# Patient Record
Sex: Female | Born: 1935 | Race: Black or African American | Hispanic: No | Smoking: Never smoker
Health system: Southern US, Community
[De-identification: ages and names within clinical notes are randomized; demographics above are authoritative.]

## PROBLEM LIST (undated history)

## (undated) DIAGNOSIS — R0600 Dyspnea, unspecified: Secondary | ICD-10-CM

## (undated) DIAGNOSIS — M1990 Unspecified osteoarthritis, unspecified site: Secondary | ICD-10-CM

## (undated) DIAGNOSIS — Z9221 Personal history of antineoplastic chemotherapy: Secondary | ICD-10-CM

## (undated) DIAGNOSIS — C801 Malignant (primary) neoplasm, unspecified: Secondary | ICD-10-CM

## (undated) DIAGNOSIS — N189 Chronic kidney disease, unspecified: Secondary | ICD-10-CM

## (undated) DIAGNOSIS — R519 Headache, unspecified: Secondary | ICD-10-CM

## (undated) DIAGNOSIS — Z923 Personal history of irradiation: Secondary | ICD-10-CM

## (undated) DIAGNOSIS — I10 Essential (primary) hypertension: Secondary | ICD-10-CM

## (undated) DIAGNOSIS — R079 Chest pain, unspecified: Secondary | ICD-10-CM

## (undated) DIAGNOSIS — R51 Headache: Secondary | ICD-10-CM

## (undated) DIAGNOSIS — K219 Gastro-esophageal reflux disease without esophagitis: Secondary | ICD-10-CM

## (undated) HISTORY — DX: Dyspnea, unspecified: R06.00

## (undated) HISTORY — PX: APPENDECTOMY: SHX54

## (undated) HISTORY — PX: TONSILLECTOMY: SUR1361

## (undated) HISTORY — PX: ABDOMINAL HYSTERECTOMY: SHX81

## (undated) HISTORY — DX: Chest pain, unspecified: R07.9

---

## 2004-02-15 ENCOUNTER — Encounter: Admission: RE | Admit: 2004-02-15 | Discharge: 2004-02-15 | Payer: Self-pay | Admitting: Family Medicine

## 2004-02-23 ENCOUNTER — Ambulatory Visit (HOSPITAL_COMMUNITY): Admission: RE | Admit: 2004-02-23 | Discharge: 2004-02-24 | Payer: Self-pay | Admitting: Family Medicine

## 2004-05-14 ENCOUNTER — Emergency Department (HOSPITAL_COMMUNITY): Admission: EM | Admit: 2004-05-14 | Discharge: 2004-05-14 | Payer: Self-pay | Admitting: Emergency Medicine

## 2004-06-01 ENCOUNTER — Encounter: Admission: RE | Admit: 2004-06-01 | Discharge: 2004-07-06 | Payer: Self-pay | Admitting: Family Medicine

## 2004-07-03 ENCOUNTER — Encounter: Admission: RE | Admit: 2004-07-03 | Discharge: 2004-07-03 | Payer: Self-pay | Admitting: Family Medicine

## 2004-09-04 ENCOUNTER — Encounter: Admission: RE | Admit: 2004-09-04 | Discharge: 2004-09-04 | Payer: Self-pay | Admitting: Family Medicine

## 2004-09-12 ENCOUNTER — Ambulatory Visit (HOSPITAL_COMMUNITY): Admission: RE | Admit: 2004-09-12 | Discharge: 2004-09-12 | Payer: Self-pay | Admitting: Family Medicine

## 2005-08-20 DIAGNOSIS — C801 Malignant (primary) neoplasm, unspecified: Secondary | ICD-10-CM

## 2005-08-20 HISTORY — DX: Malignant (primary) neoplasm, unspecified: C80.1

## 2005-08-20 HISTORY — PX: BREAST LUMPECTOMY: SHX2

## 2005-10-09 ENCOUNTER — Encounter: Admission: RE | Admit: 2005-10-09 | Discharge: 2005-10-09 | Payer: Self-pay | Admitting: Family Medicine

## 2005-10-26 ENCOUNTER — Encounter (INDEPENDENT_AMBULATORY_CARE_PROVIDER_SITE_OTHER): Payer: Self-pay | Admitting: Diagnostic Radiology

## 2005-10-26 ENCOUNTER — Encounter (INDEPENDENT_AMBULATORY_CARE_PROVIDER_SITE_OTHER): Payer: Self-pay | Admitting: Specialist

## 2005-10-26 ENCOUNTER — Encounter: Admission: RE | Admit: 2005-10-26 | Discharge: 2005-10-26 | Payer: Self-pay | Admitting: Family Medicine

## 2005-11-06 ENCOUNTER — Encounter: Admission: RE | Admit: 2005-11-06 | Discharge: 2005-11-06 | Payer: Self-pay | Admitting: Family Medicine

## 2005-11-08 ENCOUNTER — Encounter: Admission: RE | Admit: 2005-11-08 | Discharge: 2005-11-08 | Payer: Self-pay | Admitting: General Surgery

## 2005-11-09 ENCOUNTER — Encounter: Admission: RE | Admit: 2005-11-09 | Discharge: 2005-11-09 | Payer: Self-pay | Admitting: General Surgery

## 2005-11-09 ENCOUNTER — Ambulatory Visit (HOSPITAL_BASED_OUTPATIENT_CLINIC_OR_DEPARTMENT_OTHER): Admission: RE | Admit: 2005-11-09 | Discharge: 2005-11-09 | Payer: Self-pay | Admitting: General Surgery

## 2005-11-14 ENCOUNTER — Encounter (INDEPENDENT_AMBULATORY_CARE_PROVIDER_SITE_OTHER): Payer: Self-pay | Admitting: Specialist

## 2005-11-14 ENCOUNTER — Encounter: Admission: RE | Admit: 2005-11-14 | Discharge: 2005-11-14 | Payer: Self-pay | Admitting: General Surgery

## 2005-11-14 ENCOUNTER — Ambulatory Visit (HOSPITAL_COMMUNITY): Admission: RE | Admit: 2005-11-14 | Discharge: 2005-11-14 | Payer: Self-pay | Admitting: General Surgery

## 2005-11-20 ENCOUNTER — Ambulatory Visit: Payer: Self-pay | Admitting: Oncology

## 2005-11-28 LAB — CBC WITH DIFFERENTIAL/PLATELET
BASO%: 0.3 % (ref 0.0–2.0)
EOS%: 0.8 % (ref 0.0–7.0)
HCT: 38.8 % (ref 34.8–46.6)
LYMPH%: 26.5 % (ref 14.0–48.0)
MCH: 31.4 pg (ref 26.0–34.0)
MCHC: 34 g/dL (ref 32.0–36.0)
MONO#: 0.3 10*3/uL (ref 0.1–0.9)
NEUT%: 64.9 % (ref 39.6–76.8)
Platelets: 215 10*3/uL (ref 145–400)

## 2005-11-28 LAB — COMPREHENSIVE METABOLIC PANEL
ALT: 10 U/L (ref 0–40)
BUN: 13 mg/dL (ref 6–23)
CO2: 30 mEq/L (ref 19–32)
Creatinine, Ser: 0.8 mg/dL (ref 0.4–1.2)
Total Bilirubin: 0.5 mg/dL (ref 0.3–1.2)

## 2005-11-28 LAB — LACTATE DEHYDROGENASE: LDH: 167 U/L (ref 94–250)

## 2005-12-08 ENCOUNTER — Encounter: Admission: RE | Admit: 2005-12-08 | Discharge: 2005-12-08 | Payer: Self-pay | Admitting: Family Medicine

## 2005-12-25 ENCOUNTER — Encounter: Admission: RE | Admit: 2005-12-25 | Discharge: 2005-12-25 | Payer: Self-pay | Admitting: Oncology

## 2006-01-08 ENCOUNTER — Encounter: Admission: RE | Admit: 2006-01-08 | Discharge: 2006-01-08 | Payer: Self-pay | Admitting: Nephrology

## 2006-02-27 ENCOUNTER — Ambulatory Visit: Payer: Self-pay | Admitting: Oncology

## 2006-07-04 ENCOUNTER — Ambulatory Visit: Payer: Self-pay | Admitting: Oncology

## 2006-08-05 LAB — CBC WITH DIFFERENTIAL/PLATELET
BASO%: 0.3 % (ref 0.0–2.0)
LYMPH%: 35.3 % (ref 14.0–48.0)
MCHC: 33.8 g/dL (ref 32.0–36.0)
MCV: 92.9 fL (ref 81.0–101.0)
MONO%: 8 % (ref 0.0–13.0)
Platelets: 219 10*3/uL (ref 145–400)
RBC: 4.19 10*6/uL (ref 3.70–5.32)
WBC: 4.1 10*3/uL (ref 3.9–10.0)

## 2006-08-05 LAB — COMPREHENSIVE METABOLIC PANEL
ALT: 12 U/L (ref 0–35)
Alkaline Phosphatase: 70 U/L (ref 39–117)
Sodium: 141 mEq/L (ref 135–145)
Total Bilirubin: 0.4 mg/dL (ref 0.3–1.2)
Total Protein: 7.8 g/dL (ref 6.0–8.3)

## 2006-10-11 ENCOUNTER — Encounter: Admission: RE | Admit: 2006-10-11 | Discharge: 2006-10-11 | Payer: Self-pay | Admitting: General Surgery

## 2006-10-30 ENCOUNTER — Ambulatory Visit: Payer: Self-pay | Admitting: Oncology

## 2006-12-03 LAB — CBC WITH DIFFERENTIAL/PLATELET
Basophils Absolute: 0 10*3/uL (ref 0.0–0.1)
Eosinophils Absolute: 0 10*3/uL (ref 0.0–0.5)
HGB: 13.1 g/dL (ref 11.6–15.9)
MCV: 91.2 fL (ref 81.0–101.0)
MONO#: 0.4 10*3/uL (ref 0.1–0.9)
NEUT#: 2 10*3/uL (ref 1.5–6.5)
RDW: 13.6 % (ref 11.3–14.5)
WBC: 3.6 10*3/uL — ABNORMAL LOW (ref 3.9–10.0)
lymph#: 1.2 10*3/uL (ref 0.9–3.3)

## 2006-12-03 LAB — COMPREHENSIVE METABOLIC PANEL
Albumin: 4.1 g/dL (ref 3.5–5.2)
BUN: 17 mg/dL (ref 6–23)
CO2: 29 mEq/L (ref 19–32)
Calcium: 9.4 mg/dL (ref 8.4–10.5)
Chloride: 104 mEq/L (ref 96–112)
Glucose, Bld: 74 mg/dL (ref 70–99)
Potassium: 3.8 mEq/L (ref 3.5–5.3)
Sodium: 143 mEq/L (ref 135–145)
Total Protein: 7.4 g/dL (ref 6.0–8.3)

## 2006-12-19 ENCOUNTER — Ambulatory Visit: Payer: Self-pay | Admitting: Oncology

## 2007-02-28 ENCOUNTER — Ambulatory Visit: Payer: Self-pay | Admitting: Oncology

## 2007-03-03 ENCOUNTER — Inpatient Hospital Stay (HOSPITAL_COMMUNITY): Admission: EM | Admit: 2007-03-03 | Discharge: 2007-03-09 | Payer: Self-pay | Admitting: Emergency Medicine

## 2007-03-03 ENCOUNTER — Ambulatory Visit: Payer: Self-pay | Admitting: Family Medicine

## 2007-04-09 LAB — COMPREHENSIVE METABOLIC PANEL
AST: 15 U/L (ref 0–37)
Albumin: 4 g/dL (ref 3.5–5.2)
Alkaline Phosphatase: 79 U/L (ref 39–117)
BUN: 14 mg/dL (ref 6–23)
Glucose, Bld: 109 mg/dL — ABNORMAL HIGH (ref 70–99)
Potassium: 4.2 mEq/L (ref 3.5–5.3)
Sodium: 140 mEq/L (ref 135–145)
Total Bilirubin: 0.4 mg/dL (ref 0.3–1.2)

## 2007-04-09 LAB — CBC WITH DIFFERENTIAL/PLATELET
BASO%: 2 % (ref 0.0–2.0)
Eosinophils Absolute: 0.1 10*3/uL (ref 0.0–0.5)
LYMPH%: 37.1 % (ref 14.0–48.0)
MCHC: 34.5 g/dL (ref 32.0–36.0)
MONO#: 0.4 10*3/uL (ref 0.1–0.9)
MONO%: 10.6 % (ref 0.0–13.0)
NEUT#: 1.6 10*3/uL (ref 1.5–6.5)
RBC: 3.93 10*6/uL (ref 3.70–5.32)
RDW: 14.3 % (ref 11.3–14.5)
WBC: 3.4 10*3/uL — ABNORMAL LOW (ref 3.9–10.0)

## 2007-04-09 LAB — PROTIME-INR
INR: 2.7 (ref 2.00–3.50)
Protime: 32.4 Seconds — ABNORMAL HIGH (ref 10.6–13.4)

## 2007-04-29 ENCOUNTER — Ambulatory Visit: Admission: RE | Admit: 2007-04-29 | Discharge: 2007-05-13 | Payer: Self-pay | Admitting: Radiation Oncology

## 2007-05-13 ENCOUNTER — Ambulatory Visit: Payer: Self-pay | Admitting: Oncology

## 2007-05-13 LAB — CBC WITH DIFFERENTIAL/PLATELET
BASO%: 0.7 % (ref 0.0–2.0)
Basophils Absolute: 0 10*3/uL (ref 0.0–0.1)
EOS%: 1.1 % (ref 0.0–7.0)
HGB: 13.2 g/dL (ref 11.6–15.9)
MCH: 31.3 pg (ref 26.0–34.0)
MCHC: 34.8 g/dL (ref 32.0–36.0)
MCV: 90.1 fL (ref 81.0–101.0)
MONO%: 8.8 % (ref 0.0–13.0)
RBC: 4.2 10*6/uL (ref 3.70–5.32)
RDW: 14.4 % (ref 11.3–14.5)
lymph#: 2 10*3/uL (ref 0.9–3.3)

## 2007-08-22 ENCOUNTER — Ambulatory Visit: Payer: Self-pay | Admitting: Oncology

## 2007-09-02 LAB — COMPREHENSIVE METABOLIC PANEL
ALT: 18 U/L (ref 0–35)
Albumin: 3.8 g/dL (ref 3.5–5.2)
Alkaline Phosphatase: 81 U/L (ref 39–117)
Potassium: 3.6 mEq/L (ref 3.5–5.3)
Sodium: 140 mEq/L (ref 135–145)
Total Bilirubin: 0.8 mg/dL (ref 0.3–1.2)
Total Protein: 7.9 g/dL (ref 6.0–8.3)

## 2007-09-02 LAB — CBC WITH DIFFERENTIAL/PLATELET
EOS%: 1.2 % (ref 0.0–7.0)
Eosinophils Absolute: 0 10*3/uL (ref 0.0–0.5)
MCH: 31 pg (ref 26.0–34.0)
MCV: 88.6 fL (ref 81.0–101.0)
MONO%: 10.4 % (ref 0.0–13.0)
NEUT#: 1.7 10*3/uL (ref 1.5–6.5)
RBC: 4.26 10*6/uL (ref 3.70–5.32)
RDW: 11.2 % — ABNORMAL LOW (ref 11.3–14.5)
lymph#: 1.4 10*3/uL (ref 0.9–3.3)

## 2007-09-02 LAB — CANCER ANTIGEN 27.29: CA 27.29: 35 U/mL (ref 0–39)

## 2007-10-14 ENCOUNTER — Encounter: Admission: RE | Admit: 2007-10-14 | Discharge: 2007-10-14 | Payer: Self-pay | Admitting: Oncology

## 2007-10-21 ENCOUNTER — Ambulatory Visit: Payer: Self-pay | Admitting: Oncology

## 2007-10-25 ENCOUNTER — Encounter: Admission: RE | Admit: 2007-10-25 | Discharge: 2007-10-25 | Payer: Self-pay | Admitting: Oncology

## 2007-10-29 LAB — COMPREHENSIVE METABOLIC PANEL
AST: 17 U/L (ref 0–37)
Albumin: 4.1 g/dL (ref 3.5–5.2)
Alkaline Phosphatase: 70 U/L (ref 39–117)
Glucose, Bld: 92 mg/dL (ref 70–99)
Potassium: 4.5 mEq/L (ref 3.5–5.3)
Sodium: 144 mEq/L (ref 135–145)
Total Protein: 7.1 g/dL (ref 6.0–8.3)

## 2007-10-29 LAB — CBC WITH DIFFERENTIAL/PLATELET
Eosinophils Absolute: 0 10*3/uL (ref 0.0–0.5)
MCV: 90.3 fL (ref 81.0–101.0)
MONO#: 0.4 10*3/uL (ref 0.1–0.9)
MONO%: 10.3 % (ref 0.0–13.0)
NEUT#: 2 10*3/uL (ref 1.5–6.5)
RBC: 3.94 10*6/uL (ref 3.70–5.32)
RDW: 13.9 % (ref 11.3–14.5)
WBC: 3.6 10*3/uL — ABNORMAL LOW (ref 3.9–10.0)
lymph#: 1.1 10*3/uL (ref 0.9–3.3)

## 2007-11-18 ENCOUNTER — Ambulatory Visit: Admission: RE | Admit: 2007-11-18 | Discharge: 2008-01-26 | Payer: Self-pay | Admitting: Radiation Oncology

## 2008-01-19 ENCOUNTER — Ambulatory Visit: Payer: Self-pay | Admitting: Oncology

## 2008-10-14 ENCOUNTER — Encounter: Admission: RE | Admit: 2008-10-14 | Discharge: 2008-10-14 | Payer: Self-pay | Admitting: Oncology

## 2008-11-05 ENCOUNTER — Ambulatory Visit: Payer: Self-pay | Admitting: Oncology

## 2008-12-06 LAB — COMPREHENSIVE METABOLIC PANEL
Albumin: 4.1 g/dL (ref 3.5–5.2)
CO2: 27 mEq/L (ref 19–32)
Calcium: 9.2 mg/dL (ref 8.4–10.5)
Chloride: 104 mEq/L (ref 96–112)
Glucose, Bld: 79 mg/dL (ref 70–99)
Potassium: 3.9 mEq/L (ref 3.5–5.3)
Sodium: 142 mEq/L (ref 135–145)
Total Protein: 7.4 g/dL (ref 6.0–8.3)

## 2008-12-06 LAB — CBC WITH DIFFERENTIAL/PLATELET
Basophils Absolute: 0 10*3/uL (ref 0.0–0.1)
Eosinophils Absolute: 0.1 10*3/uL (ref 0.0–0.5)
HGB: 12.9 g/dL (ref 11.6–15.9)
MONO#: 0.5 10*3/uL (ref 0.1–0.9)
NEUT#: 1.7 10*3/uL (ref 1.5–6.5)
RBC: 4.01 10*6/uL (ref 3.70–5.45)
RDW: 13.7 % (ref 11.2–14.5)
WBC: 3.1 10*3/uL — ABNORMAL LOW (ref 3.9–10.3)

## 2008-12-16 ENCOUNTER — Ambulatory Visit (HOSPITAL_COMMUNITY): Admission: RE | Admit: 2008-12-16 | Discharge: 2008-12-16 | Payer: Self-pay | Admitting: Oncology

## 2009-01-02 ENCOUNTER — Emergency Department (HOSPITAL_BASED_OUTPATIENT_CLINIC_OR_DEPARTMENT_OTHER): Admission: EM | Admit: 2009-01-02 | Discharge: 2009-01-02 | Payer: Self-pay | Admitting: Emergency Medicine

## 2009-08-20 HISTORY — PX: BREAST SURGERY: SHX581

## 2009-09-09 ENCOUNTER — Ambulatory Visit: Payer: Self-pay | Admitting: Vascular Surgery

## 2009-10-05 ENCOUNTER — Emergency Department (HOSPITAL_COMMUNITY): Admission: EM | Admit: 2009-10-05 | Discharge: 2009-10-06 | Payer: Self-pay | Admitting: Emergency Medicine

## 2009-10-17 ENCOUNTER — Encounter: Admission: RE | Admit: 2009-10-17 | Discharge: 2009-10-17 | Payer: Self-pay | Admitting: Unknown Physician Specialty

## 2010-09-09 ENCOUNTER — Encounter: Payer: Self-pay | Admitting: Family Medicine

## 2010-09-10 ENCOUNTER — Encounter: Payer: Self-pay | Admitting: Oncology

## 2010-09-10 ENCOUNTER — Encounter: Payer: Self-pay | Admitting: Family Medicine

## 2010-09-21 ENCOUNTER — Other Ambulatory Visit: Payer: Self-pay | Admitting: Internal Medicine

## 2010-09-21 DIAGNOSIS — Z9889 Other specified postprocedural states: Secondary | ICD-10-CM

## 2010-10-18 ENCOUNTER — Ambulatory Visit
Admission: RE | Admit: 2010-10-18 | Discharge: 2010-10-18 | Disposition: A | Discharge status: Home / Self Care | Payer: Medicare Other | Source: Ambulatory Visit | Attending: Internal Medicine | Admitting: Internal Medicine

## 2010-10-18 DIAGNOSIS — Z9889 Other specified postprocedural states: Secondary | ICD-10-CM

## 2010-10-30 ENCOUNTER — Emergency Department (HOSPITAL_BASED_OUTPATIENT_CLINIC_OR_DEPARTMENT_OTHER)
Admission: EM | Admit: 2010-10-30 | Discharge: 2010-10-30 | Disposition: A | Discharge status: Nursing Home (Includes ICF) | Payer: Medicare Other | Attending: Emergency Medicine | Admitting: Emergency Medicine

## 2010-10-30 ENCOUNTER — Emergency Department (HOSPITAL_BASED_OUTPATIENT_CLINIC_OR_DEPARTMENT_OTHER): Payer: Medicare Other

## 2010-10-30 ENCOUNTER — Inpatient Hospital Stay (HOSPITAL_COMMUNITY)
Admission: AD | Admit: 2010-10-30 | Discharge: 2010-11-03 | DRG: 313 | Disposition: A | Discharge status: Home / Self Care | Payer: Medicare Other | Source: Other Acute Inpatient Hospital | Attending: Internal Medicine | Admitting: Internal Medicine

## 2010-10-30 ENCOUNTER — Emergency Department (INDEPENDENT_AMBULATORY_CARE_PROVIDER_SITE_OTHER): Payer: Medicare Other

## 2010-10-30 DIAGNOSIS — M1990 Unspecified osteoarthritis, unspecified site: Secondary | ICD-10-CM | POA: Diagnosis present

## 2010-10-30 DIAGNOSIS — I10 Essential (primary) hypertension: Secondary | ICD-10-CM | POA: Diagnosis present

## 2010-10-30 DIAGNOSIS — E7800 Pure hypercholesterolemia, unspecified: Secondary | ICD-10-CM | POA: Insufficient documentation

## 2010-10-30 DIAGNOSIS — I252 Old myocardial infarction: Secondary | ICD-10-CM

## 2010-10-30 DIAGNOSIS — N390 Urinary tract infection, site not specified: Secondary | ICD-10-CM | POA: Diagnosis present

## 2010-10-30 DIAGNOSIS — R079 Chest pain, unspecified: Secondary | ICD-10-CM | POA: Insufficient documentation

## 2010-10-30 DIAGNOSIS — Z7982 Long term (current) use of aspirin: Secondary | ICD-10-CM

## 2010-10-30 DIAGNOSIS — Z853 Personal history of malignant neoplasm of breast: Secondary | ICD-10-CM | POA: Insufficient documentation

## 2010-10-30 DIAGNOSIS — K5900 Constipation, unspecified: Secondary | ICD-10-CM | POA: Diagnosis present

## 2010-10-30 DIAGNOSIS — R1110 Vomiting, unspecified: Secondary | ICD-10-CM

## 2010-10-30 DIAGNOSIS — E876 Hypokalemia: Secondary | ICD-10-CM | POA: Diagnosis present

## 2010-10-30 DIAGNOSIS — Z86718 Personal history of other venous thrombosis and embolism: Secondary | ICD-10-CM

## 2010-10-30 DIAGNOSIS — R109 Unspecified abdominal pain: Secondary | ICD-10-CM

## 2010-10-30 DIAGNOSIS — E785 Hyperlipidemia, unspecified: Secondary | ICD-10-CM | POA: Diagnosis present

## 2010-10-30 DIAGNOSIS — R112 Nausea with vomiting, unspecified: Secondary | ICD-10-CM | POA: Diagnosis present

## 2010-10-30 DIAGNOSIS — I9589 Other hypotension: Secondary | ICD-10-CM | POA: Diagnosis present

## 2010-10-30 DIAGNOSIS — K5289 Other specified noninfective gastroenteritis and colitis: Secondary | ICD-10-CM | POA: Diagnosis present

## 2010-10-30 DIAGNOSIS — T465X5A Adverse effect of other antihypertensive drugs, initial encounter: Secondary | ICD-10-CM | POA: Diagnosis present

## 2010-10-30 DIAGNOSIS — M19019 Primary osteoarthritis, unspecified shoulder: Secondary | ICD-10-CM | POA: Diagnosis present

## 2010-10-30 LAB — COMPREHENSIVE METABOLIC PANEL
BUN: 17 mg/dL (ref 6–23)
CO2: 23 mEq/L (ref 19–32)
Calcium: 10 mg/dL (ref 8.4–10.5)
Creatinine, Ser: 0.6 mg/dL (ref 0.4–1.2)
GFR calc Af Amer: >60 mL/min (ref >60)
GFR calc non Af Amer: >60 mL/min (ref >60)
Glucose, Bld: 186 mg/dL — ABNORMAL HIGH (ref 70–99)
Total Bilirubin: 1 mg/dL (ref 0.3–1.2)

## 2010-10-30 LAB — CBC
HCT: 39.8 % (ref 36.0–46.0)
MCH: 30.7 pg (ref 26.0–34.0)
MCHC: 35.2 g/dL (ref 30.0–36.0)
RDW: 11.9 % (ref 11.5–15.5)

## 2010-10-30 LAB — POCT CARDIAC MARKERS
CKMB, poc: 1.6 ng/mL (ref 1.0–8.0)
Myoglobin, poc: 62.5 ng/mL (ref 12–200)

## 2010-10-30 LAB — URINALYSIS, ROUTINE W REFLEX MICROSCOPIC
Hgb urine dipstick: NEGATIVE
Ketones, ur: >80 mg/dL — AB
Leukocytes, UA: NEGATIVE
Protein, ur: 30 mg/dL — AB
Urobilinogen, UA: 1 mg/dL (ref 0.0–1.0)

## 2010-10-30 LAB — URINE MICROSCOPIC-ADD ON

## 2010-10-30 LAB — PROTIME-INR
INR: 0.92 (ref 0.00–1.49)
Prothrombin Time: 12.6 seconds (ref 11.6–15.2)

## 2010-10-30 LAB — DIFFERENTIAL
Basophils Absolute: 0 10*3/uL (ref 0.0–0.1)
Eosinophils Relative: 0 % (ref 0–5)
Lymphocytes Relative: 10 % — ABNORMAL LOW (ref 12–46)
Monocytes Absolute: 0.2 10*3/uL (ref 0.1–1.0)
Monocytes Relative: 3 % (ref 3–12)

## 2010-10-30 LAB — LIPASE, BLOOD: Lipase: 28 U/L (ref 23–300)

## 2010-10-30 LAB — APTT: aPTT: 24 seconds (ref 24–37)

## 2010-10-31 ENCOUNTER — Inpatient Hospital Stay (HOSPITAL_COMMUNITY): Payer: Medicare Other

## 2010-10-31 LAB — CK TOTAL AND CKMB (NOT AT ARMC)
CK, MB: 3.5 ng/mL (ref 0.3–4.0)
CK, MB: 4.5 ng/mL — ABNORMAL HIGH (ref 0.3–4.0)
Relative Index: INVALID (ref 0.0–2.5)
Relative Index: 1.8 (ref 0.0–2.5)
Relative Index: 1.7 (ref 0.0–2.5)
Total CK: 75 U/L (ref 7–177)
Total CK: 198 U/L — ABNORMAL HIGH (ref 7–177)
Total CK: 259 U/L — ABNORMAL HIGH (ref 7–177)

## 2010-10-31 LAB — HEMOGLOBIN A1C
Hgb A1c MFr Bld: 6.5 % — ABNORMAL HIGH (ref <5.7)
Mean Plasma Glucose: 140 mg/dL — ABNORMAL HIGH (ref <117)

## 2010-10-31 LAB — CBC
HCT: 41.3 % (ref 36.0–46.0)
Hemoglobin: 14.4 g/dL (ref 12.0–15.0)
MCH: 31.1 pg (ref 26.0–34.0)
MCHC: 34.9 g/dL (ref 30.0–36.0)
MCV: 89.2 fL (ref 78.0–100.0)
Platelets: 212 10*3/uL (ref 150–400)
RBC: 4.63 MIL/uL (ref 3.87–5.11)
RDW: 12.5 % (ref 11.5–15.5)
WBC: 6.2 10*3/uL (ref 4.0–10.5)

## 2010-10-31 LAB — COMPREHENSIVE METABOLIC PANEL
BUN: 9 mg/dL (ref 6–23)
Calcium: 9.5 mg/dL (ref 8.4–10.5)
GFR calc non Af Amer: >60 mL/min (ref >60)
Glucose, Bld: 137 mg/dL — ABNORMAL HIGH (ref 70–99)
Total Protein: 7.6 g/dL (ref 6.0–8.3)

## 2010-10-31 LAB — GLUCOSE, CAPILLARY
Glucose-Capillary: 161 mg/dL — ABNORMAL HIGH (ref 70–99)
Glucose-Capillary: 154 mg/dL — ABNORMAL HIGH (ref 70–99)

## 2010-10-31 LAB — MAGNESIUM: Magnesium: 2.1 mg/dL (ref 1.5–2.5)

## 2010-10-31 LAB — HEPARIN LEVEL (UNFRACTIONATED): Heparin Unfractionated: 0.35 IU/mL (ref 0.30–0.70)

## 2010-10-31 LAB — PHOSPHORUS: Phosphorus: 2.3 mg/dL (ref 2.3–4.6)

## 2010-10-31 MED ORDER — IOHEXOL 300 MG/ML  SOLN
100.0000 mL | Freq: Once | INTRAMUSCULAR | Status: AC | PRN
  Administered 2010-10-31: 100 mL via INTRAVENOUS

## 2010-11-01 ENCOUNTER — Inpatient Hospital Stay (HOSPITAL_COMMUNITY): Payer: Medicare Other

## 2010-11-01 LAB — CBC
HCT: 40.3 % (ref 36.0–46.0)
MCHC: 34 g/dL (ref 30.0–36.0)
MCV: 89.2 fL (ref 78.0–100.0)
RDW: 12.6 % (ref 11.5–15.5)

## 2010-11-01 LAB — DIFFERENTIAL
Basophils Absolute: 0 10*3/uL (ref 0.0–0.1)
Eosinophils Absolute: 0 10*3/uL (ref 0.0–0.7)
Eosinophils Relative: 0 % (ref 0–5)
Lymphocytes Relative: 27 % (ref 12–46)
Monocytes Absolute: 0.7 10*3/uL (ref 0.1–1.0)

## 2010-11-01 LAB — CARDIAC PANEL(CRET KIN+CKTOT+MB+TROPI)
CK, MB: 3.5 ng/mL (ref 0.3–4.0)
Relative Index: 1.5 (ref 0.0–2.5)
Total CK: 194 U/L — ABNORMAL HIGH (ref 7–177)
Total CK: 230 U/L — ABNORMAL HIGH (ref 7–177)
Troponin I: 0.04 ng/mL (ref 0.00–0.06)
Troponin I: 0.02 ng/mL (ref 0.00–0.06)

## 2010-11-01 LAB — GLUCOSE, CAPILLARY
Glucose-Capillary: 114 mg/dL — ABNORMAL HIGH (ref 70–99)
Glucose-Capillary: 115 mg/dL — ABNORMAL HIGH (ref 70–99)
Glucose-Capillary: 140 mg/dL — ABNORMAL HIGH (ref 70–99)

## 2010-11-01 LAB — URINE CULTURE
Colony Count: 55000
Culture  Setup Time: 201203121948

## 2010-11-01 LAB — COMPREHENSIVE METABOLIC PANEL
BUN: 14 mg/dL (ref 6–23)
CO2: 29 mEq/L (ref 19–32)
Chloride: 99 mEq/L (ref 96–112)
Creatinine, Ser: 0.98 mg/dL (ref 0.4–1.2)
GFR calc non Af Amer: 54 mL/min — ABNORMAL LOW (ref >60)
Total Bilirubin: 1.3 mg/dL — ABNORMAL HIGH (ref 0.3–1.2)

## 2010-11-01 LAB — CORTISOL: Cortisol, Plasma: 10.7 ug/dL

## 2010-11-02 LAB — URINALYSIS, ROUTINE W REFLEX MICROSCOPIC
Bilirubin Urine: NEGATIVE
Glucose, UA: NEGATIVE mg/dL
Hgb urine dipstick: NEGATIVE
Ketones, ur: NEGATIVE mg/dL
Protein, ur: NEGATIVE mg/dL

## 2010-11-02 LAB — DIFFERENTIAL
Eosinophils Absolute: 0 10*3/uL (ref 0.0–0.7)
Eosinophils Relative: 1 % (ref 0–5)
Lymphs Abs: 1.3 10*3/uL (ref 0.7–4.0)
Monocytes Absolute: 0.7 10*3/uL (ref 0.1–1.0)
Monocytes Relative: 18 % — ABNORMAL HIGH (ref 3–12)
Neutrophils Relative %: 49 % (ref 43–77)

## 2010-11-02 LAB — GLUCOSE, CAPILLARY
Glucose-Capillary: 92 mg/dL (ref 70–99)
Glucose-Capillary: 97 mg/dL (ref 70–99)

## 2010-11-02 LAB — BASIC METABOLIC PANEL
CO2: 28 mEq/L (ref 19–32)
Calcium: 9.1 mg/dL (ref 8.4–10.5)
Chloride: 112 mEq/L (ref 96–112)
Creatinine, Ser: 0.82 mg/dL (ref 0.4–1.2)
GFR calc Af Amer: >60 mL/min (ref >60)
Sodium: 147 mEq/L — ABNORMAL HIGH (ref 135–145)

## 2010-11-02 LAB — CARDIAC PANEL(CRET KIN+CKTOT+MB+TROPI)
CK, MB: 3.7 ng/mL (ref 0.3–4.0)
Total CK: 195 U/L — ABNORMAL HIGH (ref 7–177)
Troponin I: 0.02 ng/mL (ref 0.00–0.06)

## 2010-11-02 LAB — HEPARIN LEVEL (UNFRACTIONATED): Heparin Unfractionated: <0.1 IU/mL — ABNORMAL LOW (ref 0.30–0.70)

## 2010-11-02 LAB — CBC
MCH: 30 pg (ref 26.0–34.0)
MCV: 90 fL (ref 78.0–100.0)
RBC: 3.8 MIL/uL — ABNORMAL LOW (ref 3.87–5.11)

## 2010-11-03 LAB — CBC
HCT: 34.7 % — ABNORMAL LOW (ref 36.0–46.0)
Hemoglobin: 11.5 g/dL — ABNORMAL LOW (ref 12.0–15.0)
MCH: 29.9 pg (ref 26.0–34.0)
MCHC: 33.1 g/dL (ref 30.0–36.0)
MCV: 90.4 fL (ref 78.0–100.0)
Platelets: 153 10*3/uL (ref 150–400)
RBC: 3.84 MIL/uL — ABNORMAL LOW (ref 3.87–5.11)
RDW: 12.7 % (ref 11.5–15.5)
WBC: 2.8 10*3/uL — ABNORMAL LOW (ref 4.0–10.5)

## 2010-11-03 LAB — BASIC METABOLIC PANEL
BUN: 5 mg/dL — ABNORMAL LOW (ref 6–23)
Chloride: 106 mEq/L (ref 96–112)
GFR calc Af Amer: >60 mL/min (ref >60)
GFR calc non Af Amer: >60 mL/min (ref >60)
Potassium: 3.4 mEq/L — ABNORMAL LOW (ref 3.5–5.1)
Sodium: 139 mEq/L (ref 135–145)

## 2010-11-03 LAB — GLUCOSE, CAPILLARY
Glucose-Capillary: 111 mg/dL — ABNORMAL HIGH (ref 70–99)
Glucose-Capillary: 106 mg/dL — ABNORMAL HIGH (ref 70–99)
Glucose-Capillary: 107 mg/dL — ABNORMAL HIGH (ref 70–99)
Glucose-Capillary: 85 mg/dL (ref 70–99)

## 2010-11-03 LAB — URINE CULTURE
Colony Count: NO GROWTH
Culture  Setup Time: 201203150400

## 2010-11-06 NOTE — Consult Note (Signed)
NAME:  Felicia Carlson, Felicia Carlson                   ACCOUNT NO.:  [id number]  MEDICAL RECORD NO.:  [id number]           PATIENT TYPE:  LOCATION:                                 FACILITY:  PHYSICIAN:  Eulas Post, MD    DATE OF BIRTH:  1925/02/18  DATE OF CONSULTATION:  10/31/2010 DATE OF DISCHARGE:                                CONSULTATION   REQUESTING PHYSICIAN:  [facility].  CHIEF COMPLAINT:  Left greater than right shoulder pain.  HISTORY:  [name] is an 77 year old woman who presented to the hospital with complaints of malaise and gastric upset as well as some nausea and left-sided more than right-sided chest and shoulder pain. She is admitted for cardiac workup.  She continued to have persistent left shoulder pain.  On my history, her shoulder pain had been going on for at least 2 weeks, possibly longer.  She had difficulty lifting and difficulty using the left shoulder.  She localizes pain directly around the glenohumeral joint.She rates her pain as 8/10 when she tries to do activities.  Resting makes it better.  Her past history is significant for multiple medical problems including history of cancer as well as pulmonary embolus as well as cardiac disease and has had multiple stents and she has had blood clots previously with chronic lower extremity swelling.  Please see the written documentation for additional details of her past medical, family, social history, review of systems, and exam findings today.  She does have a past family history of cancer with a sister with ovarian cancer and mother with pancreatic cancer.  PHYSICAL EXAMINATION:  Additional exam findings demonstrate her right shoulder to have active range of motion 0 to 170 degrees.  Her rotator cuff strength is reasonably good on the right side, and she has a smooth arc of motion.  Her left side has pain with both active and passive motion, 0 to 115 degrees of forward flexion only.  Her rotator  cuff strength is weak on the side.  She has mild crepitus with motion as well.  Her CT scan demonstrates evidence for glenohumeral degenerative changes on the left side, much more than the right side.  IMPRESSION:  Left-sided shoulder pain, probable glenohumeral osteoarthritis.  PLAN:  I had an in-depth discussion with Felicia Carlson and her family regarding the options for the management of the shoulder pain.  We discussed to using a mild narcotics such as Vicodin, which they are not interested in due to the side effects and the constipation.  We have also discussed the option of an intra-articular injection, Depo-Medrol, which they are not interested in and would like to begin with more conservative treatment.  Therefore, we have discussed the options of tramadol, and they would like to give this a try and see if it is able to benefit her symptoms.  I would defer any cardiac management to the primary team, and I am going to plan to see her back for her shoulder in my office in the next 1 to 2 weeks, office telephone [phone].  If she in fact changes her  mind and would like an intra-articular injection of Depo-Medrol, then please feel free to contact me back at pager [phone].  Thank you for requesting this consultation.  Hopefully, we will get her symptoms under control through conservative measures.  If further workup is required, then I would recommend a set of plain films, 2 views of the left shoulder.     Eulas Post, MD     JPL/MEDQ  D:  10/31/2010  T:  11/01/2010  Job:  [id number]  Electronically Signed by Teryl Lucy MD on 11/06/2010 07:03:47 PM

## 2010-11-08 LAB — DIFFERENTIAL
Basophils Relative: 1 % (ref 0–1)
Eosinophils Absolute: 0.1 10*3/uL (ref 0.0–0.7)
Eosinophils Relative: 2 % (ref 0–5)
Lymphs Abs: 1 10*3/uL (ref 0.7–4.0)
Monocytes Relative: 9 % (ref 3–12)
Neutrophils Relative %: 60 % (ref 43–77)

## 2010-11-08 LAB — BASIC METABOLIC PANEL
CO2: 27 mEq/L (ref 19–32)
Calcium: 9.7 mg/dL (ref 8.4–10.5)
GFR calc Af Amer: >60 mL/min (ref >60)
Potassium: 3.3 mEq/L — ABNORMAL LOW (ref 3.5–5.1)
Sodium: 141 mEq/L (ref 135–145)

## 2010-11-08 LAB — CBC
HCT: 40.9 % (ref 36.0–46.0)
MCHC: 34 g/dL (ref 30.0–36.0)
MCV: 96.6 fL (ref 78.0–100.0)
Platelets: 195 10*3/uL (ref 150–400)
WBC: 3.6 10*3/uL — ABNORMAL LOW (ref 4.0–10.5)

## 2010-11-08 LAB — POCT CARDIAC MARKERS
CKMB, poc: 1 ng/mL (ref 1.0–8.0)
Myoglobin, poc: 91.8 ng/mL (ref 12–200)

## 2010-11-08 LAB — PROTIME-INR: Prothrombin Time: 12.6 seconds (ref 11.6–15.2)

## 2010-11-10 NOTE — Discharge Summary (Signed)
NAME:  Felicia Carlson, Felicia Carlson                   ACCOUNT NO.:  [id number]  MEDICAL RECORD NO.:  [id number]           PATIENT TYPE:  I  LOCATION:  3728                         FACILITY:  [facility]  PHYSICIAN:  Felicia Harvest, MD    DATE OF BIRTH:  [birth year]  DATE OF ADMISSION:  10/30/2010 DATE OF DISCHARGE:  11/03/2010                        DISCHARGE SUMMARY - REFERRING   PRIMARY CARE PHYSICIAN:  Felicia Carlson, M.D.  ORTHOPEDIC:  Felicia Post, MD  DISCHARGE DIAGNOSES: 1. Chest pain, unknown etiology, improving. 2. Hypotension, resolved. 3. Hypernatremia, resolved. 4. Hypertension. 5. Left-sided shoulder pain, probable glenohumeral osteal arthritis. 6. Gastroenteritis, resolved. 7. History of breast cancer, status Carlson right-sided lumpectomy and     radiation. 8. History of hypertension. 9. Hyperlipidemia. 10.History of pulmonary embolism, currently off Coumadin therapy. 11.Status Carlson hysterectomy.  DISCHARGE MEDICATIONS: 1. Tylenol 650 mg p.o. q.4 h. p.r.n. 2. Gabapentin 100 mg p.o. daily. 3. Fish oil 1 tablet p.o. daily. 4. Verapamil SR 120 mg 1 tablet p.o. daily. 5. Norvasc 5 mg p.o. daily. 6. Pepcid 20 mg p.o. daily. 7. Tramadol 50 mg p.o. q.8 h. p.r.n. shoulder pain. 8. Aspirin 81 mg p.o. daily. 9. Ibuprofen 800 mg p.o. t.i.d. x4 days, then stop. 10.Garlic extract 1 tablet p.o. daily.  DISPOSITION AND FOLLOWUP:  The patient will be discharged to home.  The patient is to follow up with her PCP in 1 week Carlson discharge to reassess her pain.  A BMET will need to be checked as well as a CBC. The patient's home blood pressure regimen was decreased, secondary to hypotension during this hospitalization.  She is put back on her Norvasc 5 mg daily.  Her clonidine has been discontinued, and she has been placed on half her home dose of verapamil, which is 120 mg daily.  The patient is also to follow up with Dr. Dion Carlson of orthopedics in 2 weeks Carlson discharge to follow up on her  left shoulder pain and at that point it will be decided as to whether the patient may benefit from a steroid injection.  The patient's PCP will also need to follow up on final 2-D echo results, which were done on October 31, 2010 during this hospitalization.  The patient will need to call to [facility] to be seen by Dr. Anne Carlson for outpatient stress test.  Dr. Minerva Carlson office of [facility] should be calling the patient be able to locate the phone number to set her up for outpatient stress test.  CONSULTATIONS DONE:  An orthopedic consultation was done.  The patient was seen in consultation by Dr. Teryl Carlson on October 31, 2010.  PROCEDURES PERFORMED: 1. An acute abdominal series was done on October 30, 2010 that showed     non-obstructive bowel gas pattern.  No free air, no acute     cardiopulmonary abnormality, degenerative osseous changes at both     shoulders, query right glenohumeral subluxation. 2. CT of the abdomen and pelvis done on October 31, 2010 showed no acute     findings to explain the patient's abdominal pain, extensive colonic     diverticulosis without  acute diverticulitis.  Incidental findings     as seen above. 3. Chest x-ray done on November 01, 2010 showed no acute findings. 4. CT angiogram of the chest done on November 02, 2010 shows nonocclusive     filling defect in the subsegmental artery to the lingula.  No     evidence of right heart strain.  No pulmonary infarction.  Addendum     says under the body of the report for the CT of the pelvis,     prostate was mention however, the patient is a female and area     described probably represents the cervical os of vaginal cuff after     hysterectomy.  A 2-D echo was done on October 31, 2010, preliminary     results shows an EF of 65-70%, LVH, aortic root mildly dilated     about 3.9 cm, mild pulmonary hypertension, calcified aortic valve,     mild aortic regurgitation, diastolic dysfunction.  Final report     will  need to be followed up upon per the patient's PCP.  BRIEF ADMISSION HISTORY AND PHYSICAL:  [name] is an 77 year old African American female with known history of hypertension, hyperlipidemia, prior history of PE after the patient had breast surgery for breast cancer.  The patient is currently off Coumadin and specifically complain of chest pain, nausea, vomiting, and abdominal pain.  The patient stated that when she woke up on the morning of admission, she started developing some abdominal pain, which was crampy in nature, diffuse with multiple episodes of nausea and vomiting.  No blood in the vomitus.  Eventually, the patient started developing chest pain, which she described as a pressure like, radiating to the left upper extremity, became more constant in nature.  The patient stated that she did have some chest pain the day prior to admission, which lasted only a few minutes.  In the ED, the patient was found to have high blood pressure.  The patient has been admitted for further workup of chest pain, abdominal pain, nausea, and vomiting.  At that time, the patient's D-dimer was found to be high and IV access was being performed at time of the admission to get a CT angiogram of the chest, abdomen, and pelvis.  The patient was placed on IV labetalol as needed for elevated blood pressure.  For the rest of the admission history and physical, please see H and P dictated per Dr. Toniann Carlson of job [phone].  HOSPITAL COURSE: 1. Chest pain.  The patient was admitted with chest pain.  The patient     does have some risk factors of hypertension, hyperlipidemia,     history of breast cancer.  Cardiac enzymes were cycled x3, which     were negative.  An EKG which was obtained did not have any     significant ST-T wave changes.  A 2-D echo was also obtained with     preliminary results, which were negative for any wall motion     abnormalities.  EF was 65%-70%.  He had some LVH.  Final  reading     will need to be followed up upon per PCP.  The patient was     monitored and followed.  The patient's chest pain was also somewhat     reproducible and as such, the patient was placed on a scheduled     Tylenol with some slow improvement in her chest pain.  An     orthopedic consultation  was also obtained as the patient was     complaining of shoulder pain and was felt that the patient likely     did have a probable glenohumeral osteoarthritis.  It was     recommended to try intra-articular injection and steroid injection.     However, the patient wanted to begin with more conservative     treatment such as tramadol and then follow up with orthopedic     doctor as an outpatient for further evaluation.  The patient     improved slowly, but clinically during the hospitalization.  She     was also placed on proton pump inhibitor.  The patient will be     discharged home in stable condition.  Follow-up appointment will be     scheduled for her to follow up with [facility] for outpatient     stress test.  The patient was discharged in stable and improved     condition.  A CT angiogram was obtained with results as stated     above, which was negative for PE.  Angiogram results were looked     over with the radiologist, and it was negative for pulmonary     embolism. 2. Hypotension.  During the hospitalization, initially on admission,     the patient was hypertensive.  She was started right back on all     her blood pressure medications.  However, the patient developed     significant hypotension with systolic blood pressure in the 70s to     80s.  It was felt this was secondary to volume depletion as cardiac     workup had been negative.  A CT angiogram, which was done of the     chest was negative for PE.  The patient seemed volume depleted.     There were no signs or symptoms of significant infection.  The     patient was initially placed on IV Rocephin for probable UTI.      However, urine cultures came back negative and Rocephin was     discontinued.  The patient was hydrated with IV fluids with     improvement in her hypotension.  Her blood pressure responded to     hypotension.  The patient's IV fluids were discontinued,     hypotension had resolved, and she was started back on Norvasc at 5     mg daily.  She will be discharged home on her Norvasc and have her     home dose of verapamil.  She will need to follow up with her PCP as     an outpatient to reassess her blood pressure.  The patient was     discharged in stable and improved condition. 3. Hypertension.  See problem #2.  During the hospitalization, the     patient became hypotensive when she was placed back on clonidine,     Norvasc, and verapamil.  Doses were held, and the patient was     hydrated with IV fluids.  She has been restarted on Norvasc 5 mg     daily.  She will be started back on her home dose of verapamil and     need to follow up with her PCP as an outpatient. 4. Left shoulder probable glenohumeral osteoarthritis.  The patient     was complaining of some shoulder pain during the hospitalization.     Orthopedic consultation was done.  The patient was seen in  consultation by Dr. Dion Carlson on October 31, 2010, and he had     recommended a Depo injection; however, the patient wanted to start     with more conservative treatment.  She was being placed on tramadol     on as-needed basis for her shoulder pain.  She will follow up with     Dr. Dion Carlson 2 weeks Carlson discharged with this will be reassessed.  The rest of the patient's chronic medical issues remained stable throughout the hospitalization.  DISCHARGE CONDITION:  The patient was discharged in stable and improved condition.  On the day of discharge, vital signs; temperature 97.4, pulse of 70, blood pressure 129/89, respiration 19, satting 98% on room air.  DISCHARGE LABORATORY DATA:  Sodium 139, potassium 3.4, chloride  106, bicarb 30, BUN 5, creatinine 0.68, glucose of 109, calcium of 8.9.  CBC; white count 2.8, hemoglobin 11.5, hematocrit 34.7, platelet count of 153.  It has been a pleasure taking care of [name].     Felicia Harvest, MD     DT/MEDQ  D:  11/03/2010  T:  11/03/2010  Job:  [id number]  cc:   Felicia Carlson, M.D. Felicia Post, MD Jake Bathe, MD  Electronically Signed by Felicia Harvest MD on 11/10/2010 12:13:27 PM

## 2010-11-20 NOTE — H&P (Signed)
NAME:  Felicia Carlson, Felicia Carlson                   ACCOUNT NO.:  [id number]  MEDICAL RECORD NO.:  [id number]           PATIENT TYPE:  I  LOCATION:  2607                         FACILITY:  [facility]  PHYSICIAN:  Eduard Clos, MDDATE OF BIRTH:  03/06/1925  DATE OF ADMISSION:  10/30/2010 DATE OF DISCHARGE:                             HISTORY & PHYSICAL   PRIMARY CARE PHYSICIAN:  Dr. Doreatha Martin  CHIEF COMPLAINT:  Nausea, vomiting, and chest pain.  HISTORY OF PRESENT ILLNESS:  This is an 77 year old female with known history of hypertension, hyperlipidemia, previous history of PE after patient had a breast surgery for breast cancer, is off Coumadin and specifically complains of chest pain with nausea, vomiting, abdominal pain.  The patient states that when she woke up in the morning she started developing some abdominal pain which is crampy in nature, diffuse with multiple episodes of nausea and vomiting.  No blood in the vomitus.  Eventually, the patient started developing chest pain which is pressure-like radiating to left arm which became more constant.  The patient states that she did have some chest pain yesterday also which lasted only for few minutes.  In the ER, the patient was found to have high blood pressure and the patient has been admitted for further workup for chest pain, abdominal pain and nausea, vomiting.  At this time, the patient's D-dimer was found to be high, and if we can get a good IV access, we are going to get a CT angio chest with abdomen and pelvis. The patient at this time is getting IV labetalol p.r.n.  PAST MEDICAL HISTORY: 1. History of CA breast status post right-sided lumpectomy and     radiation. 2. History of hypertension. 3. Hyperlipidemia. 4. History of PE, off Coumadin.  PAST SURGICAL HISTORY: 1. Hysterectomy. 2. Right-sided lumpectomy and radiation.  MEDICATIONS PRIOR TO ADMISSION:  The patient is on: 1. Amlodipine 5 mg p.o. daily. 2.  Gabapentin 100 mg p.o. daily. 3. Verapamil 120 mg two capsules daily. 4. Clonidine 0.1 Mg take one tablet in the morning, two tablets at     bedtime.  ALLERGIES:  TETANUS.  FAMILY HISTORY:  Nothing contributory.  SOCIAL HISTORY:  The patient does not smoke cigarette, drink alcohol, or use illegal drugs, lives alone, is a full code.  REVIEW OF SYSTEMS:  As per the history of present illness, nothing else significant.  PHYSICAL EXAMINATION:  GENERAL:  The patient was examined at bedside, not in acute distress. VITAL SIGNS:  Blood pressure 166/84, pulse 86 per minute, temperature 97.84, respirations 18 per minute, O2 sat is 100%. HEENT:  Anicteric.  No pallor.  No discharge from ears, eyes, nose, or mouth. CHEST:  Bilateral air entry present.  No rhonchi, no crepitation. HEART:  S1 and S2 heard. ABDOMEN:  Soft.  There is mild tenderness diffusely.  No guarding or rigidity. CENTRAL NERVOUS SYSTEM:  Alert, awake, and oriented to time, place, and person, moves upper and lower extremities 5/5. EXTREMITIES:  Peripheral pulses felt.  No edema.  LABORATORY DATA:  EKG shows sinus rhythm with some T-wave changes in the lateral  and inferior leads and some of these changes are in the old EKG in 2011. Acute abdominal series shows nonobstructed bowel gas pattern, no free air, no acute cardiopulmonary abnormality, degenerative osseous changes at both shoulders, query right glenohumeral subluxation. CBC:  WBC is 7.3, hemoglobin is 14, hematocrit is 39.8, platelets 270, neutrophils 87.  PT and INR are 12.6 and 0.9, D-dimer is 1.08.  Complete metabolic panel:  Sodium 145, potassium 4.1, chloride 105, carbon dioxide 23, glucose 186, BUN 17, creatinine 0.6, total bilirubin is 1, alkaline phos is 111, AST 26, ALT 11, total protein 9.2, albumin 4.7, calcium 10, lipase 28, CK-MB less than 1, troponin less than 0.05, myoglobin is 71.2.  UA is negative for nitrites, leukocytes, ketones more than 80,  urine glucose negative.  ASSESSMENT: 1. Chest pain.  We will rule out acute coronary syndrome. 2. Uncontrolled hypertension. 3. History of pulmonary embolism. 4. Nausea, vomiting, abdominal pain. 5. History of carcinoma breast status post right-sided lumpectomy and     radiation. 6. History of renal artery stenosis status post stent placement.  PLAN: 1. At this time, I will admit the patient to step-down unit because of     persistent chest pain. 2. For her chest pain with a previous history of PE, at this time has     been already started which we will continue.  We will cycle cardiac     markers.  The patient will be on aspirin per rectally until the     patient can take it p.o.  We will get 2-D echo.  At this time, our     my concern is to rule out PE, so we are going to get a CT angio     chest, along with it we will get also abdomen and pelvis as the     patient also has abdominal pain.  If the patient does not get a     good IV access, then we may have to get a V/Q scan. 3. Nausea, vomiting, and abdominal pain could be viral.  By this time,     we are also going to get a CT abdomen and pelvis.  The patient has     persistent pain. 4. Mild hyperglycemia.  We will check hemoglobin A1c. 5. Uncontrolled hypertension.  We will place the patient on p.o.     labetalol and at this time, we are going to keep the patient on     clonidine patch which can be changed to p.o. clonidine when the     patient can take p.o. 6. The patient is a full code and further recommendation as condition     evolves.     Eduard Clos, MD     ANK/MEDQ  D:  10/30/2010  T:  10/31/2010  Job:  [id number]  cc:   Dr. Doreatha Martin  Electronically Signed by Midge Minium MD on 11/20/2010 07:50:53 AM

## 2011-01-02 NOTE — Discharge Summary (Signed)
NAME:  Felicia Carlson, Felicia Carlson                   ACCOUNT NO.:  [id number]   MEDICAL RECORD NO.:  [id number]          PATIENT TYPE:  INP   LOCATION:  3705                         FACILITY:  [facility]   PHYSICIAN:  Santiago Bumpers. Hensel, M.D.DATE OF BIRTH:  [birth year]   DATE OF ADMISSION:  03/03/2007  DATE OF DISCHARGE:  03/09/2007                               DISCHARGE SUMMARY   PRIMARY CARE PHYSICIAN:  Dr. Charlesetta Shanks, [facility].   CONSULTATIONS:  None.   PROCEDURE:  CT angio.   REASON FOR ADMISSION:  Pulmonary embolism.   DISCHARGE DIAGNOSES:  1. Pulmonary embolism.  2. Hypertension.   DISCHARGE MEDICATIONS:  The patient is discharged on:  1. Coumadin 5 mg once daily.  Only #7 pills given in case her PCP      needs to adjust the dosage on Tuesday.  2. Senna one tablet p.o. b.i.d.  3. Vicodin 5 mg one to two tablets p.o. q.6h. p.r.n. pain.  She was      only given #25 of these and no refills just trying to bridge her to      the resolution of her pulmonary embolism so that she is able to      take deep breaths and keep her lungs open.   HOSPITAL COURSE:  Felicia Carlson is a 77 year old female who came to the  emergency department on March 03, 2007 complaining of chest pain and  shortness of breath.  She states it had been going on for about 3 days,  however, it had worsened to the point on March 03, 2007 that she was  getting short of breath just walking to the kitchen and this prompted  her to come to the hospital.  She did have an elevated D-dimer in the  emergency room and so a CT scan angio was done.  This showed an  extensive bilateral pulmonary embolism extending into all lobes,  however, it was greater on the right than the left.  We admitted her  into the hospital and she became mildly hypotensive the night of her  admission.  She had systolic blood pressures in the 80's and 90's with  diastolic's of 40's and 50's.  She responded well to several fluid  boluses.   We did hold her Lasix and her Exforge and only continued her  home medicine of Clonidine.  She did remain normotensive for the  remainder of her stay.  She was started on Warfarin and Lovenox protocol  through pharmacy.  While we were waiting for her INR to become  therapeutic we kept her in the hospital assessing her oxygen requiring  and controlling her pain.  She did have sharp pleuritic chest pain that  was relieved with Vicodin 5 mg one to two tabs q.4h.  Due to the nature  of the chest pain being in her right arm, right shoulder and right neck  one day, we did recheck cardiac enzymes and an EKG, both of which were  normal.  After approximately 3 days in the hospital she was no longer  requiring  oxygen at rest nor during ambulation and her pain became well  controlled on the Vicodin 5.  She was able to take deeper breaths.  We  encouraged her to use her incentive spirometer. She did spike a fever of  101.7.  She had no other symptoms.  She felt fine.  Her vital signs were  stable.  We did get a chest x-ray which showed a wedge-shaped infarct in  her right lower lobe and atelectasis in the left lower lobe.  She has  been afebrile for the last 36 hours.  On Sunday, March 09, 2007 her INR  was in therapeutic range at 2.6. She was not requiring oxygen and her  pain was well controlled, therefore, we did discharge her home in stable  condition.   DISCHARGE FOLLOWUP:  She does have a followup with her primary care  physician, Dr. Charlesetta Shanks on Tuesday, March 11, 2007 at 11:30 in the  morning.  She is aware of this appointment and will be able to keep it.  We did give her the number of the office so that she may adjust the  appointment should she not be able to go.  We did stress, though, that  it is very important that she needs to go to have her INR checked and to  assess her vital signs and oxygen saturation.   FOLLOWUP ISSUES:  She should have a PT/INR redrawn to see if she remains   in therapeutic level and has become supratherapeutic or subtherapeutic.  Also a focus physical listening to her lungs and checking her vital  signs to make sure she is still stable would be advised as the pulmonary  embolism she suffered was rather large.      Ardeen Garland, MD  Electronically Signed      Santiago Bumpers. Leveda Anna, M.D.  Electronically Signed    LM/MEDQ  D:  03/09/2007  T:  03/09/2007  Job:  [id number]   cc:   Charlesetta Shanks  Address [address] [place]. [id number]

## 2011-01-02 NOTE — H&P (Signed)
NAME:  Felicia Carlson, Felicia Carlson                   ACCOUNT NO.:  [id number]   MEDICAL RECORD NO.:  [id number]          PATIENT TYPE:  EMS   LOCATION:  MAJO                         FACILITY:  [facility]   PHYSICIAN:  Santiago Bumpers. Hensel, M.D.DATE OF BIRTH:  March 30, 1925   DATE OF ADMISSION:  03/03/2007  DATE OF DISCHARGE:                              HISTORY & PHYSICAL   PRIMARY CARE PHYSICIAN:  Dr. Celene Skeen.   CHIEF COMPLAINT:  Chest pain and shortness of breath.   HISTORY OF PRESENT ILLNESS:  The patient states that she began to have  chest pressure 3 days ago, it was over her right chest and went through  to her back.  It was more or less constant, lasting only occasionally  and for short periods of time.  It was associated with mild nausea,  occasional diaphoresis and shortness of breath.  She has also had  episodes, when she felt her heart was racing.  The pain and shortness of  breath had been progressively worsening until today when she was short  of air just walking to the kitchen, this prompted her to the come to the  ED.   REVIEW OF SYSTEMS:  The patient states she was feeling well prior to the  beginning of this chest pain.  She denies fevers, chills, cough,  abdominal pain, vomiting and diarrhea.  She states her feet usually  swell, right side greater than left, but she denies any tenderness or  erythema.  She endorses nausea and shortness of air per HPI and state  she has had some right ear pain for a couple of days.   PAST MEDICAL HISTORY:  1. Right renal artery stent approximately 2 years ago.  2. Right lumpectomy for breast cancer in March of 2007.  3. Hypertension.   MEDICATIONS:  She takes:  1. Clonidine 0.1 mg b.i.d.  2. Exforge 10/320 mg daily.  3. Furosemide 80 mg daily.  4. Faslodex 250 mg IM shot monthly up at [facility].   FAMILY HISTORY:  No known heart, lung or blood clotting problems.   SOCIAL HISTORY:  She is accompanied by her sister and her daughter.  She  lives  alone in her townhouse across the street from her sister.  She  still drives.  She has never smoked or used illicit drugs.  She drinks  an occasional glass of wine.   LABS IN THE EMERGENCY ROOM:  D-dimer was found to be 3.61.  Creatinine  is less than 0.2.  BNP is normal.  CBC shows white blood cells of 4.9  and a hemoglobin of 12.8.  Point of care cardiac markers showed a CK-MB  of 1.2 and a troponin of less than 0.05.  Study of the CT angio was  positive for PE and a chest x-ray was within normal limits, showing no  acute process.   PHYSICAL EXAMINATION:  VITAL SIGNS:  Her temperature is 98.4.  Pulse 76.  Respiratory rate 20.  Blood pressure 112/73.  She was saturating 91% on  room air and then was placed on 2 liters nasal canula  and now saturating  95%.  GENERAL:  She is pleasant, alert and oriented in no acute distress and  comfortable.  HEENT:  She is normocephalic, atraumatic.  Pupils are equal, round and  reactive to light.  Extraocular movements are intact.  Her mucous  membranes are moist and oropharynx is clear.  Her bilateral TMs are  clear with a good light reflex and no bulging.  NECK:  Shows no cervical or supraclavicular  lymphadenopathy.  CV:  She is regular rate and rhythm without a murmur.  LUNGS:  Clear to auscultation bilaterally with possible decreased breath  sounds in her bilateral bases.  ABDOMEN:  Soft, nontender, nondistended.  Positive bowel sounds.  No  masses and no guarding.  GU:  Deferred.  EXTREMITIES:  She moves all extremities spontaneously.  She does have 1+  pitting edema to the level of her lower shin bilaterally.  No tenderness  in the lower extremities or erythema.  She does have strong peripheral  pulses.   ASSESSMENT/PLAN:  This is a 77 year old woman, complaining of chest pain  and shortness of air, found to have a pulmonary embolism per CT angio.   1. Pulmonary embolism.  We will start Coumadin per pharmacy and cover      with treatment  dose of Lovenox until INR becomes therapeutic.  She      will be admitted to a telemetry floor with vitals q.4 hours and      continue with __________ , monitoring for tachycardia, hypoxia and      tachypnea.  We will draw cardiac enzymes q.8 hours x3 to rule out      acute myocardial infarction.  2. History of breast cancer.  Treated with a lumpectomy in March of      2007 and monthly Faslodex injections.  Her injection is due to have      mild side effects of this medication, venous thromboembolism;      therefore, we will hold this injection for now.  3. Hypertension.  She does have a right renal artery stent.  She is      followed by Dr. Darrick Penna.  We will continue her home meds.  We      will start her on Exforge 10/320 mg, clonidine 0.1 mg b.i.d. and      furosemide 80 mg daily.  She will be admitted with a low-sodium      diet, activity ad lib.  Oxygen by nasal cannula to keep saturations      greater than 90%.  Protonix 40 mg p.o. daily and Tylenol 1000 mg      p.o. q.8 hours p.r.n. pain.      Ardeen Garland, MD  Electronically Signed      Santiago Bumpers. Leveda Anna, M.D.  Electronically Signed    LM/MEDQ  D:  03/03/2007  T:  03/03/2007  Job:  [id number]

## 2011-01-05 NOTE — Op Note (Signed)
NAME:  Kabir, Jonquil                   ACCOUNT NO.:  [id number]   MEDICAL RECORD NO.:  [id number]          PATIENT TYPE:  AMB   LOCATION:  SDS                          FACILITY:  [facility]   PHYSICIAN:  Leonie Man, M.D.   DATE OF BIRTH:  1924/10/15   DATE OF PROCEDURE:  11/14/2005  DATE OF DISCHARGE:  11/14/2005                                 OPERATIVE REPORT   PREOPERATIVE DIAGNOSIS:  Carcinoma right breast.   POSTOPERATIVE DIAGNOSIS:  Carcinoma right breast.   PROCEDURE:  Right partial mastectomy and right sentinel lymph node biopsy.   SURGEON:  Leonie Man, M.D.   ASSISTANT:  OR tech.   ANESTHESIA:  General.   SPECIMENS TO THE LAB:  Right breast tissue and right axillary lymph nodes,  numbers three.   ESTIMATED BLOOD LOSS:  Minimal.   COMPLICATIONS:  None.   DISPOSITION:  The patient returned to PACU in excellent condition.   Ms. Citlally Captain is a 77 year old African-American female who, on screening  mammography, is noted to have a lesion of the lateral aspect of the right  breast which on diagnostic mammogram shows a 1.1 spiculated lesion and on  biopsy shows invasive mammary carcinoma. The patient comes to the operating  room now for partial mastectomy and sentinel lymph node biopsy with the  possibility of an axillary lymph node dissection should the lymph nodes to  be positive for tumor.  She understands the risks and potential benefits of  surgery and gives her consent to same.   PROCEDURE:  Following the induction of satisfactory general anesthesia with  the patient positioned supinely, I infiltrated the region around the right  areola with 5 mL of diluted methylene blue dye and massaged this into the  lymphatics.  It should be noted that the patient appears to have been  injected with radionucleotide dye in the periareolar region.  The breast was  then prepped and draped to be included in the sterile operative field and  the localizing wire, which was located in  the inferior and lateral portion  of the breast taking a sharp return superiorly, is noted.  The incision is  made superior to the insertion site and deepened to the skin and down to the  subcutaneous tissues.  A flap is raised inferiorly carrying this down to the  localizing needle which is then brought into the incision. Superior, medial,  lateral flaps were then created and a wedge of breast tissue surrounding the  needle is carried down to the anterolateral chest wall making the posterior  margin at the chest wall.  The specimen is removed and a superior 12 o'clock  marker and a 3 o'clock marker is placed, and the specimen was then forwarded  for radiologic diagnosis.  In radiology, both the lesion, the inserted clip,  as well as the wire were noted.  This was then forwarded for pathologic  evaluation which showed the margins to be negative.   Attention then turned to the right axilla and using a NeoProbe, the area of  high radioactive counts was noted.  A  transverse incision is made over this  area, deepened through the skin and subcutaneous tissue and dissection  carried down to the sentinel lymph nodes using the NeoProbe for guidance.  Three blue hot nodes were dissected free from the axilla and forwarded for  pathologic evaluation.  On touch prep, these all showed no evidence of  metastatic carcinoma.  Hemostasis was assured within the axilla with  electrocautery.  Sponge and instrument counts of both incisions was made and  noted to be correct.  The breast incision was closed in three layers using  interrupted 2-0 Vicryl sutures to reapproximate the breast tissues and then  3-0 Vicryl suture in the subcutaneous tissues and 5-0 Monocryl suture to  close skin. Within the axilla, a 2-0 Vicryl suture was used to reapproximate  the subcutaneous tissues and a 5-0 Monocryl was used to close skin.  Sterile  dressings were applied to all wounds after Steri-Strips had been applied.  The  anesthetic was reversed and the patient removed from the operating room  to the recovery room in stable condition.  She tolerated the procedure well.      Leonie Man, M.D.  Electronically Signed     PB/MEDQ  D:  11/14/2005  T:  11/15/2005  Job:  [id number]   cc:   Maryla Morrow. Modesto Charon, M.D.  Fax: [phone]

## 2011-06-04 LAB — BASIC METABOLIC PANEL
BUN: 17
BUN: 20
BUN: 6
BUN: 7
CO2: 27
CO2: 28
CO2: 30
CO2: 30
CO2: 30
Calcium: 8.5
Calcium: 8.9
Chloride: 103
Chloride: 104
Chloride: 104
Chloride: 107
Chloride: 99
Creatinine, Ser: 0.71
Creatinine, Ser: 0.74
Creatinine, Ser: 0.91
Creatinine, Ser: 1.13
GFR calc Af Amer: >60
GFR calc Af Amer: >60
GFR calc non Af Amer: >60
Glucose, Bld: 103 — ABNORMAL HIGH
Glucose, Bld: 106 — ABNORMAL HIGH
Glucose, Bld: 112 — ABNORMAL HIGH
Glucose, Bld: 113 — ABNORMAL HIGH
Potassium: 3.8
Potassium: 3.8
Potassium: 4.2
Potassium: 4.4
Potassium: 4.5
Sodium: 136
Sodium: 137
Sodium: 138
Sodium: 140

## 2011-06-04 LAB — CBC
HCT: 33 — ABNORMAL LOW
HCT: 33.1 — ABNORMAL LOW
HCT: 33.8 — ABNORMAL LOW
Hemoglobin: 11.1 — ABNORMAL LOW
Hemoglobin: 11.3 — ABNORMAL LOW
Hemoglobin: 11.4 — ABNORMAL LOW
Hemoglobin: 11.7 — ABNORMAL LOW
MCHC: 33.7
MCHC: 33.7
MCHC: 34.1
MCHC: 34.1
MCV: 90.7
MCV: 91.9
MCV: 93.3
Platelets: 165
Platelets: 213
RBC: 3.54 — ABNORMAL LOW
RBC: 3.63 — ABNORMAL LOW
RBC: 3.63 — ABNORMAL LOW
RBC: 3.68 — ABNORMAL LOW
RBC: 3.73 — ABNORMAL LOW
RDW: 12.8
RDW: 13.3
WBC: 6.9

## 2011-06-04 LAB — CARDIAC PANEL(CRET KIN+CKTOT+MB+TROPI)
Relative Index: INVALID
Relative Index: INVALID
Total CK: 43
Total CK: 61
Troponin I: 0.05

## 2011-06-04 LAB — PROTIME-INR
INR: 1.3
INR: 1.9 — ABNORMAL HIGH
Prothrombin Time: 29.5 — ABNORMAL HIGH

## 2011-06-04 LAB — APTT: aPTT: 29

## 2011-06-05 LAB — I-STAT 8, (EC8 V) (CONVERTED LAB)
Acid-base deficit: 4 — ABNORMAL HIGH
Bicarbonate: 18.6 — ABNORMAL LOW
Potassium: 3.7
TCO2: 19
pCO2, Ven: 27.5 — ABNORMAL LOW
pH, Ven: 7.438 — ABNORMAL HIGH

## 2011-06-05 LAB — D-DIMER, QUANTITATIVE: D-Dimer, Quant: 3.61 — ABNORMAL HIGH

## 2011-06-05 LAB — POCT CARDIAC MARKERS
CKMB, poc: 1.2
Troponin i, poc: <0.05

## 2011-06-05 LAB — CBC
Hemoglobin: 12.8
MCHC: 33.5
MCV: 92.1
RBC: 4.16
RDW: 13.5

## 2011-06-05 LAB — PROTIME-INR: INR: 0.9

## 2011-06-05 LAB — APTT: aPTT: 24

## 2011-09-24 ENCOUNTER — Other Ambulatory Visit: Payer: Self-pay | Admitting: Internal Medicine

## 2011-09-24 DIAGNOSIS — Z853 Personal history of malignant neoplasm of breast: Secondary | ICD-10-CM

## 2011-10-26 ENCOUNTER — Ambulatory Visit
Admission: RE | Admit: 2011-10-26 | Discharge: 2011-10-26 | Disposition: A | Discharge status: Home / Self Care | Payer: Medicare Other | Source: Ambulatory Visit | Attending: Internal Medicine | Admitting: Internal Medicine

## 2011-10-26 DIAGNOSIS — Z853 Personal history of malignant neoplasm of breast: Secondary | ICD-10-CM

## 2012-10-17 ENCOUNTER — Other Ambulatory Visit: Payer: Self-pay | Admitting: Internal Medicine

## 2012-10-17 DIAGNOSIS — Z9889 Other specified postprocedural states: Secondary | ICD-10-CM

## 2012-10-29 ENCOUNTER — Ambulatory Visit
Admission: RE | Admit: 2012-10-29 | Discharge: 2012-10-29 | Disposition: A | Discharge status: Home / Self Care | Payer: Medicare Other | Source: Ambulatory Visit | Attending: Internal Medicine | Admitting: Internal Medicine

## 2012-10-29 DIAGNOSIS — Z9889 Other specified postprocedural states: Secondary | ICD-10-CM

## 2012-10-29 DIAGNOSIS — Z853 Personal history of malignant neoplasm of breast: Secondary | ICD-10-CM

## 2013-01-07 ENCOUNTER — Encounter (INDEPENDENT_AMBULATORY_CARE_PROVIDER_SITE_OTHER): Payer: Medicare Other

## 2013-01-07 ENCOUNTER — Other Ambulatory Visit: Payer: Self-pay | Admitting: Sports Medicine

## 2013-01-07 DIAGNOSIS — R609 Edema, unspecified: Secondary | ICD-10-CM

## 2013-01-07 DIAGNOSIS — R52 Pain, unspecified: Secondary | ICD-10-CM

## 2013-01-07 DIAGNOSIS — M79609 Pain in unspecified limb: Secondary | ICD-10-CM

## 2013-01-07 DIAGNOSIS — M7989 Other specified soft tissue disorders: Secondary | ICD-10-CM

## 2013-08-20 HISTORY — PX: EYE SURGERY: SHX253

## 2013-10-08 ENCOUNTER — Other Ambulatory Visit: Payer: Self-pay

## 2013-10-08 DIAGNOSIS — Z1231 Encounter for screening mammogram for malignant neoplasm of breast: Secondary | ICD-10-CM

## 2013-10-30 ENCOUNTER — Ambulatory Visit
Admission: RE | Admit: 2013-10-30 | Discharge: 2013-10-30 | Disposition: A | Discharge status: Home / Self Care | Payer: Medicare PPO | Source: Ambulatory Visit

## 2013-10-30 DIAGNOSIS — Z1231 Encounter for screening mammogram for malignant neoplasm of breast: Secondary | ICD-10-CM

## 2014-10-15 ENCOUNTER — Other Ambulatory Visit (HOSPITAL_COMMUNITY): Payer: Self-pay | Admitting: Orthopaedic Surgery

## 2014-10-19 ENCOUNTER — Other Ambulatory Visit: Payer: Self-pay

## 2014-10-19 ENCOUNTER — Ambulatory Visit (HOSPITAL_COMMUNITY)
Admission: RE | Admit: 2014-10-19 | Discharge: 2014-10-19 | Disposition: A | Discharge status: Home / Self Care | Payer: Medicare PPO | Source: Ambulatory Visit | Attending: Anesthesiology | Admitting: Anesthesiology

## 2014-10-19 ENCOUNTER — Encounter (HOSPITAL_COMMUNITY)
Admission: RE | Admit: 2014-10-19 | Discharge: 2014-10-19 | Disposition: A | Discharge status: Home / Self Care | Payer: Medicare PPO | Source: Ambulatory Visit | Attending: Orthopaedic Surgery | Admitting: Orthopaedic Surgery

## 2014-10-19 ENCOUNTER — Encounter (HOSPITAL_COMMUNITY): Payer: Self-pay

## 2014-10-19 DIAGNOSIS — Z01810 Encounter for preprocedural cardiovascular examination: Secondary | ICD-10-CM | POA: Diagnosis not present

## 2014-10-19 DIAGNOSIS — Z01812 Encounter for preprocedural laboratory examination: Secondary | ICD-10-CM | POA: Insufficient documentation

## 2014-10-19 DIAGNOSIS — Z01818 Encounter for other preprocedural examination: Secondary | ICD-10-CM

## 2014-10-19 HISTORY — DX: Headache, unspecified: R51.9

## 2014-10-19 HISTORY — DX: Essential (primary) hypertension: I10

## 2014-10-19 HISTORY — DX: Malignant (primary) neoplasm, unspecified: C80.1

## 2014-10-19 HISTORY — DX: Headache: R51

## 2014-10-19 HISTORY — DX: Chronic kidney disease, unspecified: N18.9

## 2014-10-19 HISTORY — DX: Unspecified osteoarthritis, unspecified site: M19.90

## 2014-10-19 HISTORY — DX: Gastro-esophageal reflux disease without esophagitis: K21.9

## 2014-10-19 LAB — CBC
HCT: 38.3 % (ref 36.0–46.0)
Hemoglobin: 12.7 g/dL (ref 12.0–15.0)
MCH: 31.1 pg (ref 26.0–34.0)
MCHC: 33.2 g/dL (ref 30.0–36.0)
MCV: 93.6 fL (ref 78.0–100.0)
Platelets: 186 10*3/uL (ref 150–400)
RBC: 4.09 MIL/uL (ref 3.87–5.11)
RDW: 13.3 % (ref 11.5–15.5)
WBC: 3.5 10*3/uL — ABNORMAL LOW (ref 4.0–10.5)

## 2014-10-19 LAB — BASIC METABOLIC PANEL
ANION GAP: 8 (ref 5–15)
BUN: 16 mg/dL (ref 6–23)
CHLORIDE: 105 mmol/L (ref 96–112)
CO2: 26 mmol/L (ref 19–32)
CREATININE: 0.79 mg/dL (ref 0.50–1.10)
Calcium: 9.2 mg/dL (ref 8.4–10.5)
GFR calc non Af Amer: 73 mL/min — ABNORMAL LOW (ref >90)
GFR, EST AFRICAN AMERICAN: 84 mL/min — AB (ref >90)
Glucose, Bld: 120 mg/dL — ABNORMAL HIGH (ref 70–99)
Potassium: 3.7 mmol/L (ref 3.5–5.1)
Sodium: 139 mmol/L (ref 135–145)

## 2014-10-19 LAB — PROTIME-INR
INR: 1.03 (ref 0.00–1.49)
Prothrombin Time: 13.6 seconds (ref 11.6–15.2)

## 2014-10-19 LAB — ABO/RH: ABO/RH(D): O POS

## 2014-10-19 LAB — APTT: aPTT: 29 seconds (ref 24–37)

## 2014-10-19 LAB — SURGICAL PCR SCREEN
MRSA, PCR: NEGATIVE
Staphylococcus aureus: NEGATIVE

## 2014-10-19 NOTE — Pre-Procedure Instructions (Signed)
Felicia Carlson  10/19/2014   Your procedure is scheduled on:  Tuesday March 8, at 1440 PM  Report to [place] at 1240 PM.  Call this number if you have problems the morning of surgery: [phone]   Remember:   Do not eat food or drink liquids after midnight.   Take these medicines the morning of surgery with A SIP OF WATER: Coreg  Stop Naproxen and herbal medication now.    Do not wear jewelry, make-up or nail polish.  Do not wear lotions, powders, or perfumes. You may wear deodorant.  Do not shave 48 hours prior to surgery.   Do not bring valuables to the hospital.  [facility] is not responsible                  for any belongings or valuables.               Contacts, dentures or bridgework may not be worn into surgery.  Leave suitcase in the car. After surgery it may be brought to your room.  For patients admitted to the hospital, discharge time is determined by your                treatment team.               Patients discharged the day of surgery will not be allowed to drive  home.    Special Instructions: [facility] - Preparing for Surgery  Before surgery, you can play an important role.  Because skin is not sterile, your skin needs to be as free of germs as possible.  You can reduce the number of germs on you skin by washing with CHG (chlorahexidine gluconate) soap before surgery.  CHG is an antiseptic cleaner which kills germs and bonds with the skin to continue killing germs even after washing.  Please DO NOT use if you have an allergy to CHG or antibacterial soaps.  If your skin becomes reddened/irritated stop using the CHG and inform your nurse when you arrive at Short Stay.  Do not shave (including legs and underarms) for at least 48 hours prior to the first CHG shower.  You may shave your face.  Please follow these instructions carefully:   1.  Shower with CHG Soap the night before surgery and the                                morning of  Surgery.  2.  If you choose to wash your hair, wash your hair first as usual with your       normal shampoo.  3.  After you shampoo, rinse your hair and body thoroughly to remove the                      Shampoo.  4.  Use CHG as you would any other liquid soap.  You can apply chg directly       to the skin and wash gently with scrungie or a clean washcloth.  5.  Apply the CHG Soap to your body ONLY FROM THE NECK DOWN.        Do not use on open wounds or open sores.  Avoid contact with your eyes,       ears, mouth and genitals (private parts).  Wash genitals (private parts)       with your  normal soap.  6.  Wash thoroughly, paying special attention to the area where your surgery        will be performed.  7.  Thoroughly rinse your body with warm water from the neck down.  8.  DO NOT shower/wash with your normal soap after using and rinsing off       the CHG Soap.  9.  Pat yourself dry with a clean towel.            10.  Wear clean pajamas.            11.  Place clean sheets on your bed the night of your first shower and do not        sleep with pets.  Day of Surgery  Do not apply any lotions/deoderants the morning of surgery.  Please wear clean clothes to the hospital/surgery center.      Please read over the following fact sheets that you were given: Pain Booklet, Coughing and Deep Breathing, Blood Transfusion Information, MRSA Information and Surgical Site Infection Prevention

## 2014-10-20 NOTE — Progress Notes (Addendum)
Anesthesia Chart Review: Patient is a 81 year old female scheduled for right TKA on 10/26/14 by Dr. Jean Rosenthal.  History includes non-smoker, chest pain with normal stress test 09/27/13, right renal artery stent, GERD, right breast cancer, HTN, SOB, hysterectomy.  PCP is listed as Dr. Rodena Medin.   10/19/14 EKG: SB at 59 bpm, PACs, septal infarct (age undetermined), T wave abnormality, consider lateral ischemia. Septal infarct is new, but lateral T wave abnormality was present on 10/30/10 EKG. EKG reading indicates she may have had a Mobitz type II AVB at that time.  She did have one non-conducted QRS with a p wave (question atypical PVC), but otherwise her PR interval was consistent and associated with a QRS following a p-wave.  Dr. Lajuan Lines referred her to cardiologist Dr. Milana Huntsman.with [facility] for a pre-operative stress test due to abnormal EKG.  This was done on 09/27/14 and showed: Normal Lexiscan nuclear study with no ischemia suggested. Normal overall LV function, EF 66%.  Currently last available echo is from 11/03/10 Echo: LV: mild focal basal hypertrophy of the septum. Systolic function was vigorous. Estimated EF 65-70%. Normal wall motion  There was no regional wall motion abnormalities. Doppler parameters are consistent with abnormal LV relaxation (grade 1 diastolic dysfunction). Mild AR. Trivial MR. Mild PI. PA systolic pressure was mildly increased.   Cardiology notes mention he is also doing an echo, so Judeen Hammans at Dr. Trevor Mace office will request and fax that report to [place].   10/19/14 CXR: No active cardiopulmonary disease.  Preoperative labs noted.    Chart will be left for follow-up regarding echo results.  George Hugh [facility] Short Stay Center/Anesthesiology Phone [phone] 10/20/2014 1:28 PM  Addendum: Echo 09/27/2014:  -LV cavity is normal in size. Mild concentric LVH.  -Trileaflet aortic valve with mild regurgitation. Sclerosis of the aortic valve.   -structurally normal mitral valve with mild regurgitation.  -structurally normal tricuspid valve with mild regurgitation -mild pulmonary hypertension, 49 mmHg  Willeen Cass, FNP-BC [facility] Short Stay Surgical Center/Anesthesiology Phone: [phone] 10/21/2014 1:12 PM

## 2014-10-25 MED ORDER — CEFAZOLIN SODIUM-DEXTROSE 2-3 GM-% IV SOLR
2.0000 g | INTRAVENOUS | Status: AC
  Administered 2014-10-26: 2 g via INTRAVENOUS
  Filled 2014-10-25: qty 50

## 2014-10-26 ENCOUNTER — Inpatient Hospital Stay (HOSPITAL_COMMUNITY): Payer: Medicare PPO | Admitting: Anesthesiology

## 2014-10-26 ENCOUNTER — Inpatient Hospital Stay (HOSPITAL_COMMUNITY): Payer: Medicare PPO

## 2014-10-26 ENCOUNTER — Encounter (HOSPITAL_COMMUNITY): Payer: Self-pay | Admitting: *Deleted

## 2014-10-26 ENCOUNTER — Encounter (HOSPITAL_COMMUNITY)
Admission: RE | Disposition: A | Discharge status: Home / Self Care | Payer: Self-pay | Source: Ambulatory Visit | Attending: Orthopaedic Surgery

## 2014-10-26 ENCOUNTER — Inpatient Hospital Stay (HOSPITAL_COMMUNITY): Payer: Medicare PPO | Admitting: Vascular Surgery

## 2014-10-26 ENCOUNTER — Inpatient Hospital Stay (HOSPITAL_COMMUNITY)
Admission: RE | Admit: 2014-10-26 | Discharge: 2014-10-30 | DRG: 470 | Disposition: A | Discharge status: Home / Self Care | Payer: Medicare PPO | Source: Ambulatory Visit | Attending: Orthopaedic Surgery | Admitting: Orthopaedic Surgery

## 2014-10-26 DIAGNOSIS — Z7901 Long term (current) use of anticoagulants: Secondary | ICD-10-CM

## 2014-10-26 DIAGNOSIS — Z96651 Presence of right artificial knee joint: Secondary | ICD-10-CM

## 2014-10-26 DIAGNOSIS — I129 Hypertensive chronic kidney disease with stage 1 through stage 4 chronic kidney disease, or unspecified chronic kidney disease: Secondary | ICD-10-CM | POA: Diagnosis present

## 2014-10-26 DIAGNOSIS — K219 Gastro-esophageal reflux disease without esophagitis: Secondary | ICD-10-CM | POA: Diagnosis not present

## 2014-10-26 DIAGNOSIS — N189 Chronic kidney disease, unspecified: Secondary | ICD-10-CM | POA: Diagnosis present

## 2014-10-26 DIAGNOSIS — M1711 Unilateral primary osteoarthritis, right knee: Principal | ICD-10-CM

## 2014-10-26 DIAGNOSIS — D62 Acute posthemorrhagic anemia: Secondary | ICD-10-CM | POA: Diagnosis not present

## 2014-10-26 DIAGNOSIS — G8918 Other acute postprocedural pain: Secondary | ICD-10-CM | POA: Diagnosis not present

## 2014-10-26 DIAGNOSIS — Z79899 Other long term (current) drug therapy: Secondary | ICD-10-CM

## 2014-10-26 DIAGNOSIS — M25561 Pain in right knee: Secondary | ICD-10-CM | POA: Diagnosis present

## 2014-10-26 HISTORY — PX: TOTAL KNEE ARTHROPLASTY: SHX125

## 2014-10-26 SURGERY — ARTHROPLASTY, KNEE, TOTAL
Anesthesia: General | Site: Knee | Laterality: Right

## 2014-10-26 MED ORDER — FENTANYL CITRATE 0.05 MG/ML IJ SOLN
INTRAMUSCULAR | Status: AC
  Filled 2014-10-26: qty 5

## 2014-10-26 MED ORDER — ONDANSETRON HCL 4 MG PO TABS: 4.0000 mg | ORAL_TABLET | Freq: Four times a day (QID) | ORAL | Status: DC | PRN

## 2014-10-26 MED ORDER — ACETAMINOPHEN 325 MG PO TABS
650.0000 mg | ORAL_TABLET | Freq: Four times a day (QID) | ORAL | Status: DC | PRN
  Administered 2014-10-28 – 2014-10-30 (×3): 650 mg via ORAL
  Filled 2014-10-26 (×3): qty 2

## 2014-10-26 MED ORDER — SODIUM CHLORIDE 0.9 % IV SOLN
INTRAVENOUS | Status: DC
  Administered 2014-10-26: 20:00:00 via INTRAVENOUS

## 2014-10-26 MED ORDER — ONDANSETRON HCL 4 MG/2ML IJ SOLN: 4.0000 mg | Freq: Four times a day (QID) | INTRAMUSCULAR | Status: DC | PRN

## 2014-10-26 MED ORDER — METHOCARBAMOL 1000 MG/10ML IJ SOLN
500.0000 mg | Freq: Four times a day (QID) | INTRAVENOUS | Status: DC | PRN
  Filled 2014-10-26: qty 5

## 2014-10-26 MED ORDER — PHENOL 1.4 % MT LIQD: 1.0000 | OROMUCOSAL | Status: DC | PRN

## 2014-10-26 MED ORDER — GLYCOPYRROLATE 0.2 MG/ML IJ SOLN
INTRAMUSCULAR | Status: AC
  Filled 2014-10-26: qty 2

## 2014-10-26 MED ORDER — FENTANYL CITRATE 0.05 MG/ML IJ SOLN
INTRAMUSCULAR | Status: DC | PRN
  Administered 2014-10-26: 100 ug via INTRAVENOUS
  Administered 2014-10-26 (×2): 50 ug via INTRAVENOUS

## 2014-10-26 MED ORDER — LACTATED RINGERS IV SOLN
INTRAVENOUS | Status: DC
  Administered 2014-10-26: 15:00:00 via INTRAVENOUS

## 2014-10-26 MED ORDER — METHOCARBAMOL 500 MG PO TABS
ORAL_TABLET | ORAL | Status: AC
  Filled 2014-10-26: qty 1

## 2014-10-26 MED ORDER — NEOSTIGMINE METHYLSULFATE 10 MG/10ML IV SOLN
INTRAVENOUS | Status: AC
  Filled 2014-10-26: qty 1

## 2014-10-26 MED ORDER — ALUM & MAG HYDROXIDE-SIMETH 200-200-20 MG/5ML PO SUSP: 30.0000 mL | ORAL | Status: DC | PRN

## 2014-10-26 MED ORDER — ONDANSETRON HCL 4 MG/2ML IJ SOLN
INTRAMUSCULAR | Status: DC | PRN
  Administered 2014-10-26: 4 mg via INTRAVENOUS

## 2014-10-26 MED ORDER — CEFAZOLIN SODIUM 1-5 GM-% IV SOLN
1.0000 g | Freq: Four times a day (QID) | INTRAVENOUS | Status: AC
  Administered 2014-10-26 – 2014-10-27 (×2): 1 g via INTRAVENOUS
  Filled 2014-10-26 (×2): qty 50

## 2014-10-26 MED ORDER — FUROSEMIDE 40 MG PO TABS
40.0000 mg | ORAL_TABLET | Freq: Every day | ORAL | Status: DC
  Administered 2014-10-27 – 2014-10-30 (×4): 40 mg via ORAL
  Filled 2014-10-26 (×4): qty 1

## 2014-10-26 MED ORDER — OXYCODONE HCL 5 MG PO TABS
ORAL_TABLET | ORAL | Status: AC
  Filled 2014-10-26: qty 1

## 2014-10-26 MED ORDER — GLYCOPYRROLATE 0.2 MG/ML IJ SOLN
INTRAMUSCULAR | Status: DC | PRN
  Administered 2014-10-26: 0.4 mg via INTRAVENOUS
  Administered 2014-10-26: 0.2 mg via INTRAVENOUS

## 2014-10-26 MED ORDER — ACETAMINOPHEN 650 MG RE SUPP: 650.0000 mg | Freq: Four times a day (QID) | RECTAL | Status: DC | PRN

## 2014-10-26 MED ORDER — NEOSTIGMINE METHYLSULFATE 10 MG/10ML IV SOLN
INTRAVENOUS | Status: DC | PRN
  Administered 2014-10-26: 3 mg via INTRAVENOUS

## 2014-10-26 MED ORDER — FENTANYL CITRATE 0.05 MG/ML IJ SOLN
INTRAMUSCULAR | Status: AC
  Administered 2014-10-26: 100 ug
  Filled 2014-10-26: qty 2

## 2014-10-26 MED ORDER — MEPERIDINE HCL 25 MG/ML IJ SOLN: 6.2500 mg | INTRAMUSCULAR | Status: DC | PRN

## 2014-10-26 MED ORDER — METOCLOPRAMIDE HCL 10 MG PO TABS: 5.0000 mg | ORAL_TABLET | Freq: Three times a day (TID) | ORAL | Status: DC | PRN

## 2014-10-26 MED ORDER — CARVEDILOL 12.5 MG PO TABS
12.5000 mg | ORAL_TABLET | Freq: Two times a day (BID) | ORAL | Status: DC
  Administered 2014-10-27 – 2014-10-30 (×6): 12.5 mg via ORAL
  Filled 2014-10-26 (×6): qty 1

## 2014-10-26 MED ORDER — PROMETHAZINE HCL 25 MG/ML IJ SOLN: 6.2500 mg | INTRAMUSCULAR | Status: DC | PRN

## 2014-10-26 MED ORDER — MIDAZOLAM HCL 2 MG/2ML IJ SOLN
INTRAMUSCULAR | Status: AC
  Filled 2014-10-26: qty 2

## 2014-10-26 MED ORDER — DIPHENHYDRAMINE HCL 12.5 MG/5ML PO ELIX: 12.5000 mg | ORAL_SOLUTION | ORAL | Status: DC | PRN

## 2014-10-26 MED ORDER — FENTANYL CITRATE 0.05 MG/ML IJ SOLN
INTRAMUSCULAR | Status: AC
  Filled 2014-10-26: qty 2

## 2014-10-26 MED ORDER — DOCUSATE SODIUM 100 MG PO CAPS
100.0000 mg | ORAL_CAPSULE | Freq: Two times a day (BID) | ORAL | Status: DC
  Administered 2014-10-26 – 2014-10-30 (×8): 100 mg via ORAL
  Filled 2014-10-26 (×9): qty 1

## 2014-10-26 MED ORDER — METHOCARBAMOL 500 MG PO TABS
500.0000 mg | ORAL_TABLET | Freq: Four times a day (QID) | ORAL | Status: DC | PRN
  Administered 2014-10-26 – 2014-10-29 (×9): 500 mg via ORAL
  Filled 2014-10-26 (×8): qty 1

## 2014-10-26 MED ORDER — ROCURONIUM BROMIDE 100 MG/10ML IV SOLN
INTRAVENOUS | Status: DC | PRN
  Administered 2014-10-26: 40 mg via INTRAVENOUS

## 2014-10-26 MED ORDER — HYDROMORPHONE HCL 1 MG/ML IJ SOLN
0.5000 mg | INTRAMUSCULAR | Status: DC | PRN
  Administered 2014-10-26 – 2014-10-27 (×4): 0.5 mg via INTRAVENOUS
  Filled 2014-10-26 (×4): qty 1

## 2014-10-26 MED ORDER — OXYCODONE HCL 5 MG PO TABS
5.0000 mg | ORAL_TABLET | ORAL | Status: DC | PRN
  Administered 2014-10-26: 5 mg via ORAL
  Administered 2014-10-26 – 2014-10-29 (×13): 10 mg via ORAL
  Administered 2014-10-30: 5 mg via ORAL
  Filled 2014-10-26 (×14): qty 2

## 2014-10-26 MED ORDER — LIDOCAINE HCL (CARDIAC) 20 MG/ML IV SOLN
INTRAVENOUS | Status: DC | PRN
  Administered 2014-10-26: 50 mg via INTRATRACHEAL
  Administered 2014-10-26: 50 mg via INTRAVENOUS

## 2014-10-26 MED ORDER — EPHEDRINE SULFATE 50 MG/ML IJ SOLN
INTRAMUSCULAR | Status: DC | PRN
  Administered 2014-10-26: 5 mg via INTRAVENOUS
  Administered 2014-10-26: 10 mg via INTRAVENOUS

## 2014-10-26 MED ORDER — RIVAROXABAN 10 MG PO TABS
10.0000 mg | ORAL_TABLET | Freq: Every day | ORAL | Status: DC
  Administered 2014-10-27 – 2014-10-30 (×4): 10 mg via ORAL
  Filled 2014-10-26 (×4): qty 1

## 2014-10-26 MED ORDER — FENTANYL CITRATE 0.05 MG/ML IJ SOLN
25.0000 ug | INTRAMUSCULAR | Status: DC | PRN
  Administered 2014-10-26 (×2): 25 ug via INTRAVENOUS
  Administered 2014-10-26: 50 ug via INTRAVENOUS

## 2014-10-26 MED ORDER — PHENYLEPHRINE HCL 10 MG/ML IJ SOLN
INTRAMUSCULAR | Status: DC | PRN
  Administered 2014-10-26: 80 ug via INTRAVENOUS

## 2014-10-26 MED ORDER — MENTHOL 3 MG MT LOZG: 1.0000 | LOZENGE | OROMUCOSAL | Status: DC | PRN

## 2014-10-26 MED ORDER — METOCLOPRAMIDE HCL 5 MG/ML IJ SOLN: 5.0000 mg | Freq: Three times a day (TID) | INTRAMUSCULAR | Status: DC | PRN

## 2014-10-26 MED ORDER — SODIUM CHLORIDE 0.9 % IR SOLN
Status: DC | PRN
Start: 2014-10-26 — End: 2014-10-26
  Administered 2014-10-26: 1000 mL
  Administered 2014-10-26: 3000 mL

## 2014-10-26 MED ORDER — LACTATED RINGERS IV SOLN
INTRAVENOUS | Status: DC | PRN
  Administered 2014-10-26: 15:00:00 via INTRAVENOUS

## 2014-10-26 MED ORDER — PROPOFOL 10 MG/ML IV BOLUS
INTRAVENOUS | Status: DC | PRN
  Administered 2014-10-26: 150 mg via INTRAVENOUS

## 2014-10-26 SURGICAL SUPPLY — 69 items
BANDAGE ELASTIC 4 VELCRO ST LF (GAUZE/BANDAGES/DRESSINGS) ×2 IMPLANT
BANDAGE ELASTIC 6 VELCRO ST LF (GAUZE/BANDAGES/DRESSINGS) ×4 IMPLANT
BANDAGE ESMARK 6X9 LF (GAUZE/BANDAGES/DRESSINGS) ×1 IMPLANT
BLADE SAG 18X100X1.27 (BLADE) ×3 IMPLANT
BNDG CMPR 9X6 STRL LF SNTH (GAUZE/BANDAGES/DRESSINGS) ×1
BNDG ESMARK 6X9 LF (GAUZE/BANDAGES/DRESSINGS) ×3
BOWL SMART MIX CTS (DISPOSABLE) IMPLANT
CAPT KNEE TOTAL 3 ×2 IMPLANT
CEMENT BONE SIMPLEX SPEEDSET (Cement) ×4 IMPLANT
CLOSURE WOUND 1/2 X4 (GAUZE/BANDAGES/DRESSINGS) ×2
COVER SURGICAL LIGHT HANDLE (MISCELLANEOUS) ×3 IMPLANT
CUFF TOURNIQUET SINGLE 34IN LL (TOURNIQUET CUFF) ×3 IMPLANT
DRAPE IMP U-DRAPE 54X76 (DRAPES) ×3 IMPLANT
DRAPE INCISE IOBAN 66X45 STRL (DRAPES) ×3 IMPLANT
DRAPE ORTHO SPLIT 77X108 STRL (DRAPES) ×6
DRAPE PROXIMA HALF (DRAPES) ×3 IMPLANT
DRAPE SURG ORHT 6 SPLT 77X108 (DRAPES) ×2 IMPLANT
DRAPE U-SHAPE 47X51 STRL (DRAPES) ×3 IMPLANT
DRSG PAD ABDOMINAL 8X10 ST (GAUZE/BANDAGES/DRESSINGS) ×3 IMPLANT
DURAPREP 26ML APPLICATOR (WOUND CARE) ×3 IMPLANT
ELECT CAUTERY BLADE 6.4 (BLADE) ×2 IMPLANT
ELECT REM PT RETURN 9FT ADLT (ELECTROSURGICAL) ×3
ELECTRODE REM PT RTRN 9FT ADLT (ELECTROSURGICAL) ×1 IMPLANT
EVACUATOR 1/8 PVC DRAIN (DRAIN) IMPLANT
FACESHIELD WRAPAROUND (MASK) ×6 IMPLANT
FACESHIELD WRAPAROUND OR TEAM (MASK) ×3 IMPLANT
GAUZE SPONGE 4X4 12PLY STRL (GAUZE/BANDAGES/DRESSINGS) ×3 IMPLANT
GAUZE XEROFORM 1X8 LF (GAUZE/BANDAGES/DRESSINGS) ×3 IMPLANT
GAUZE XEROFORM 5X9 LF (GAUZE/BANDAGES/DRESSINGS) ×2 IMPLANT
GLOVE BIO SURGEON STRL SZ8 (GLOVE) ×6 IMPLANT
GLOVE BIOGEL PI IND STRL 8 (GLOVE) ×1 IMPLANT
GLOVE BIOGEL PI INDICATOR 8 (GLOVE) ×2
GLOVE ECLIPSE 7.0 STRL STRAW (GLOVE) ×3 IMPLANT
GLOVE ORTHO TXT STRL SZ7.5 (GLOVE) ×3 IMPLANT
GOWN STRL REUS W/ TWL LRG LVL3 (GOWN DISPOSABLE) ×2 IMPLANT
GOWN STRL REUS W/ TWL XL LVL3 (GOWN DISPOSABLE) ×4 IMPLANT
GOWN STRL REUS W/TWL LRG LVL3 (GOWN DISPOSABLE) ×3
GOWN STRL REUS W/TWL XL LVL3 (GOWN DISPOSABLE) ×6
HANDPIECE INTERPULSE COAX TIP (DISPOSABLE) ×3
IMMOBILIZER KNEE 22 UNIV (SOFTGOODS) IMPLANT
KIT BASIN OR (CUSTOM PROCEDURE TRAY) ×3 IMPLANT
KIT ROOM TURNOVER OR (KITS) ×3 IMPLANT
MANIFOLD NEPTUNE II (INSTRUMENTS) ×3 IMPLANT
NS IRRIG 1000ML POUR BTL (IV SOLUTION) ×3 IMPLANT
PACK TOTAL JOINT (CUSTOM PROCEDURE TRAY) ×3 IMPLANT
PACK UNIVERSAL I (CUSTOM PROCEDURE TRAY) ×3 IMPLANT
PAD ABD 8X10 STRL (GAUZE/BANDAGES/DRESSINGS) ×2 IMPLANT
PAD ARMBOARD 7.5X6 YLW CONV (MISCELLANEOUS) ×4 IMPLANT
PAD CAST 4YDX4 CTTN HI CHSV (CAST SUPPLIES) IMPLANT
PADDING CAST COTTON 4X4 STRL (CAST SUPPLIES) ×3
PADDING CAST COTTON 6X4 STRL (CAST SUPPLIES) ×3 IMPLANT
SET HNDPC FAN SPRY TIP SCT (DISPOSABLE) IMPLANT
SET PAD KNEE POSITIONER (MISCELLANEOUS) ×3 IMPLANT
SPONGE GAUZE 4X4 12PLY STER LF (GAUZE/BANDAGES/DRESSINGS) ×2 IMPLANT
STAPLER VISISTAT 35W (STAPLE) IMPLANT
STRIP CLOSURE SKIN 1/2X4 (GAUZE/BANDAGES/DRESSINGS) ×4 IMPLANT
SUCTION FRAZIER TIP 10 FR DISP (SUCTIONS) ×2 IMPLANT
SUT VIC AB 0 CT1 27 (SUTURE) ×3
SUT VIC AB 0 CT1 27XBRD ANBCTR (SUTURE) ×1 IMPLANT
SUT VIC AB 1 CT1 27 (SUTURE) ×6
SUT VIC AB 1 CT1 27XBRD ANBCTR (SUTURE) ×2 IMPLANT
SUT VIC AB 2-0 CT1 27 (SUTURE) ×6
SUT VIC AB 2-0 CT1 TAPERPNT 27 (SUTURE) ×2 IMPLANT
SUT VIC AB 4-0 PS2 27 (SUTURE) ×3 IMPLANT
TOWEL OR 17X24 6PK STRL BLUE (TOWEL DISPOSABLE) ×3 IMPLANT
TOWEL OR 17X26 10 PK STRL BLUE (TOWEL DISPOSABLE) ×3 IMPLANT
TRAY FOLEY CATH 16FRSI W/METER (SET/KITS/TRAYS/PACK) IMPLANT
WATER STERILE IRR 1000ML POUR (IV SOLUTION) ×2 IMPLANT
WRAP KNEE MAXI GEL POST OP (GAUZE/BANDAGES/DRESSINGS) ×3 IMPLANT

## 2014-10-26 NOTE — Anesthesia Preprocedure Evaluation (Signed)
Anesthesia Evaluation  Patient identified by MRN, date of birth, ID band Patient awake    Reviewed: Allergy & Precautions, NPO status , Patient's Chart, lab work & pertinent test results  Airway        Dental   Pulmonary          Cardiovascular hypertension, Pt. on medications  ECHO 09/2014 EF 65%, negative STRESS test   Neuro/Psych    GI/Hepatic GERD-  Medicated,  Endo/Other    Renal/GU Creat .79     Musculoskeletal   Abdominal   Peds  Hematology   Anesthesia Other Findings   Reproductive/Obstetrics                             Anesthesia Physical Anesthesia Plan  ASA: III  Anesthesia Plan: General   Post-op Pain Management: MAC Combined w/ Regional for Post-op pain   Induction: Intravenous  Airway Management Planned: Oral ETT  Additional Equipment:   Intra-op Plan:   Post-operative Plan:   Informed Consent: I have reviewed the patients History and Physical, chart, labs and discussed the procedure including the risks, benefits and alternatives for the proposed anesthesia with the patient or authorized representative who has indicated his/her understanding and acceptance.     Plan Discussed with:   Anesthesia Plan Comments: (Spinal or GA with block)        Anesthesia Quick Evaluation

## 2014-10-26 NOTE — Anesthesia Procedure Notes (Addendum)
Anesthesia Regional Block:  Popliteal block  Pre-Anesthetic Checklist: ,, timeout performed, Correct Patient, Correct Site, Correct Laterality, Correct Procedure, Correct Position, site marked, Risks and benefits discussed,  Surgical consent,  Pre-op evaluation,  At surgeon's request and post-op pain management  Laterality: Lower and Right  Prep: Maximum Sterile Barrier Precautions used and chloraprep       Needles:  Injection technique: Single-shot  Needle Type: Echogenic Stimulator Needle     Needle Length: 10cm 10 cm Needle Gauge: 21 and 21 G    Additional Needles:  Procedures: ultrasound guided (picture in chart) Popliteal block Narrative:  Start time: 10/26/2014 3:11 PM End time: 10/26/2014 3:21 PM Injection made incrementally with aspirations every 5 mL.  Performed by: Personally  Anesthesiologist: Alexis Frock  Additional Notes: R popliteal block with 20m .5% marcaine with epi, multiple asp, talked to patient throughout, no complications   Procedure Name: Intubation Date/Time: 10/26/2014 3:37 PM Performed by: YMaryland PinkPre-anesthesia Checklist: Patient identified, Emergency Drugs available, Suction available, Patient being monitored and Timeout performed Patient Re-evaluated:Patient Re-evaluated prior to inductionOxygen Delivery Method: Circle system utilized Preoxygenation: Pre-oxygenation with 100% oxygen Intubation Type: IV induction Ventilation: Mask ventilation without difficulty Laryngoscope Size: Mac and 3 Grade View: Grade I Tube type: Oral Tube size: 7.0 mm Number of attempts: 1 Airway Equipment and Method: Stylet and LTA kit utilized Placement Confirmation: ETT inserted through vocal cords under direct vision,  positive ETCO2 and breath sounds checked- equal and bilateral Secured at: 20 cm Tube secured with: Tape Dental Injury: Teeth and Oropharynx as per pre-operative assessment

## 2014-10-26 NOTE — Anesthesia Postprocedure Evaluation (Signed)
  Anesthesia Post-op Note  Patient: Felicia Carlson  Procedure(s) Performed: Procedure(s): RIGHT TOTAL KNEE ARTHROPLASTY (Right)  Patient Location: PACU  Anesthesia Type:General  Level of Consciousness: awake and alert   Airway and Oxygen Therapy: Patient Spontanous Breathing  Post-op Pain: mild  Post-op Assessment: Post-op Vital signs reviewed, Patient's Cardiovascular Status Stable and Respiratory Function Stable  Post-op Vital Signs: Reviewed  Filed Vitals:   10/26/14 1835  BP: 138/60  Pulse:   Temp:   Resp:     Complications: No apparent anesthesia complications

## 2014-10-26 NOTE — Brief Op Note (Signed)
10/26/2014  5:23 PM  PATIENT:  Felicia Carlson  81 y.o. female  PRE-OPERATIVE DIAGNOSIS:  right knee painful tricompartmental arthritis  POST-OPERATIVE DIAGNOSIS:  right knee painful tricompartmental arthritis  PROCEDURE:  Procedure(s): RIGHT TOTAL KNEE ARTHROPLASTY (Right)  SURGEON:  Surgeon(s) and Role:    * [name], MD - Primary  PHYSICIAN ASSISTANT: Benita Stabile, PA-C  ANESTHESIA:   general and regional  EBL:  Total I/O In: 500 [I.V.:500] Out: -   BLOOD ADMINISTERED:none  DRAINS: none   LOCAL MEDICATIONS USED:  NONE  SPECIMEN:  No Specimen  DISPOSITION OF SPECIMEN:  N/A  COUNTS:  YES  TOURNIQUET:   Total Tourniquet Time Documented: Thigh (Right) - 60 minutes Total: Thigh (Right) - 60 minutes   DICTATION: .Other Dictation: Dictation Number [id number]  PLAN OF CARE: Admit to inpatient   PATIENT DISPOSITION:  PACU - hemodynamically stable.   Delay start of Pharmacological VTE agent (>24hrs) due to surgical blood loss or risk of bleeding: no

## 2014-10-26 NOTE — H&P (Signed)
TOTAL KNEE ADMISSION H&P  Patient is being admitted for right total knee arthroplasty.  Subjective:  Chief Complaint:right knee pain.  HPI: Felicia Carlson, 81 y.o. female, has a history of pain and functional disability in the right knee due to arthritis and has failed non-surgical conservative treatments for greater than 12 weeks to includeNSAID's and/or analgesics, corticosteriod injections, flexibility and strengthening excercises, supervised PT with diminished ADL's post treatment, use of assistive devices and activity modification.  Onset of symptoms was gradual, starting 5 years ago with gradually worsening course since that time. The patient noted no past surgery on the right knee(s).  Patient currently rates pain in the right knee(s) at 9 out of 10 with activity. Patient has night pain, worsening of pain with activity and weight bearing, pain that interferes with activities of daily living, pain with passive range of motion, crepitus and joint swelling.  Patient has evidence of subchondral cysts, subchondral sclerosis, periarticular osteophytes and joint space narrowing by imaging studies. There is no active infection.  Patient Active Problem List   Diagnosis Date Noted  . Osteoarthritis of right knee 10/26/2014   Past Medical History  Diagnosis Date  . Chest pain   . Dyspnea   . Hypertension   . Chronic kidney disease     stent in kidney right side  . Cancer     breast cancer right side  . GERD (gastroesophageal reflux disease)     sometimes  . Arthritis   . Headache     shingles right side of head and right eye    Past Surgical History  Procedure Laterality Date  . Breast surgery Right 2011    lumpectomy  . Tonsillectomy      as a child  . Eye surgery Bilateral 2015    cataracts  . Abdominal hysterectomy    . Appendectomy      Prescriptions prior to admission  Medication Sig Dispense Refill Last Dose  . ALOE VERA PO Take 1 tablet by mouth daily.     . APPLE CIDER  VINEGAR PO Take 1 tablet by mouth daily.     . carvedilol (COREG) 12.5 MG tablet Take 12.5 mg by mouth 2 (two) times daily with a meal.     . furosemide (LASIX) 40 MG tablet Take 40 mg by mouth daily.     Marland Kitchen HAWTHORNE BERRY PO Take 1 tablet by mouth daily.     . naproxen sodium (ANAPROX) 220 MG tablet Take 220 mg by mouth every 12 (twelve) hours as needed (pain).      Allergies  Allergen Reactions  . Tetanus Immune Globulin Swelling    History  Substance Use Topics  . Smoking status: Never Smoker   . Smokeless tobacco: Never Used  . Alcohol Use: Yes     Comment: wine occasional    No family history on file.   Review of Systems  Musculoskeletal: Positive for joint pain.  All other systems reviewed and are negative.   Objective:  Physical Exam  Constitutional: She is oriented to person, place, and time. She appears well-developed and well-nourished.  HENT:  Head: Normocephalic and atraumatic.  Eyes: EOM are normal. Pupils are equal, round, and reactive to light.  Neck: Normal range of motion. Neck supple.  Cardiovascular: Normal rate.   Respiratory: Effort normal and breath sounds normal.  GI: Soft. Bowel sounds are normal.  Musculoskeletal:       Right knee: She exhibits decreased range of motion, swelling, effusion and abnormal  alignment. Tenderness found. Medial joint line and lateral joint line tenderness noted.  Neurological: She is alert and oriented to person, place, and time.  Skin: Skin is warm and dry.  Psychiatric: She has a normal mood and affect.    Vital signs in last 24 hours: Temp:  [97.7 F (36.5 C)] 97.7 F (36.5 C) (03/08 1212) Pulse Rate:  [63] 63 (03/08 1212) Resp:  [18] 18 (03/08 1212) BP: (174)/(70) 174/70 mmHg (03/08 1216) SpO2:  [100 %] 100 % (03/08 1212) Weight:  [74.844 kg (165 lb)] 74.844 kg (165 lb) (03/08 1212)  Labs:   Estimated body mass index is 30.68 kg/(m^2) as calculated from the following:   Height as of 10/19/14: 5' 1.5" (1.562  m).   Weight as of this encounter: 74.844 kg (165 lb).   Imaging Review Plain radiographs demonstrate severe degenerative joint disease of the right knee(s). The overall alignment issignificant valgus. The bone quality appears to be good for age and reported activity level.  Assessment/Plan:  End stage arthritis, right knee   The patient history, physical examination, clinical judgment of the provider and imaging studies are consistent with end stage degenerative joint disease of the right knee(s) and total knee arthroplasty is deemed medically necessary. The treatment options including medical management, injection therapy arthroscopy and arthroplasty were discussed at length. The risks and benefits of total knee arthroplasty were presented and reviewed. The risks due to aseptic loosening, infection, stiffness, patella tracking problems, thromboembolic complications and other imponderables were discussed. The patient acknowledged the explanation, agreed to proceed with the plan and consent was signed. Patient is being admitted for inpatient treatment for surgery, pain control, PT, OT, prophylactic antibiotics, VTE prophylaxis, progressive ambulation and ADL's and discharge planning. The patient is planning to be discharged SNF.

## 2014-10-26 NOTE — Progress Notes (Signed)
Orthopedic Tech Progress Note Patient Details:  Felicia Carlson 26-Mar-1925 [id number] Applied CPM to RLE.  Applied OHF with trapeze to pt.'s bed. CPM Right Knee CPM Right Knee: On Right Knee Flexion (Degrees): 90 Right Knee Extension (Degrees): 0   Darrol Poke 10/26/2014, 6:19 PM

## 2014-10-26 NOTE — Transfer of Care (Signed)
Immediate Anesthesia Transfer of Care Note  Patient: Felicia Carlson  Procedure(s) Performed: Procedure(s): RIGHT TOTAL KNEE ARTHROPLASTY (Right)  Patient Location: PACU  Anesthesia Type:General  Level of Consciousness: awake, alert  and oriented  Airway & Oxygen Therapy: Patient Spontanous Breathing and Patient connected to nasal cannula oxygen  Post-op Assessment: Report given to RN, Post -op Vital signs reviewed and stable and Patient moving all extremities  Post vital signs: Reviewed and stable  Last Vitals:  Filed Vitals:   10/26/14 1216  BP: 174/70  Pulse:   Temp:   Resp:     Complications: No apparent anesthesia complications

## 2014-10-27 LAB — TYPE AND SCREEN
ABO/RH(D): O POS
Antibody Screen: NEGATIVE
UNIT DIVISION: 0
Unit division: 0

## 2014-10-27 LAB — CBC
HCT: 32.9 % — ABNORMAL LOW (ref 36.0–46.0)
HEMOGLOBIN: 11 g/dL — AB (ref 12.0–15.0)
MCH: 30.8 pg (ref 26.0–34.0)
MCHC: 33.4 g/dL (ref 30.0–36.0)
MCV: 92.2 fL (ref 78.0–100.0)
Platelets: UNDETERMINED 10*3/uL (ref 150–400)
RBC: 3.57 MIL/uL — ABNORMAL LOW (ref 3.87–5.11)
RDW: 12.9 % (ref 11.5–15.5)
WBC: 9.1 10*3/uL (ref 4.0–10.5)

## 2014-10-27 LAB — BASIC METABOLIC PANEL
Anion gap: 10 (ref 5–15)
BUN: 17 mg/dL (ref 6–23)
CALCIUM: 8.8 mg/dL (ref 8.4–10.5)
CO2: 26 mmol/L (ref 19–32)
CREATININE: 0.92 mg/dL (ref 0.50–1.10)
Chloride: 99 mmol/L (ref 96–112)
GFR calc non Af Amer: 54 mL/min — ABNORMAL LOW (ref >90)
GFR, EST AFRICAN AMERICAN: 63 mL/min — AB (ref >90)
Glucose, Bld: 173 mg/dL — ABNORMAL HIGH (ref 70–99)
Potassium: 3.8 mmol/L (ref 3.5–5.1)
SODIUM: 135 mmol/L (ref 135–145)

## 2014-10-27 NOTE — Progress Notes (Signed)
Physical Therapy Treatment Note:   Clinical Impression:  Pt deferred ambulation and transfers/bed mobility due to just walking with OT prior to PT treatment.  Pt slowly progressing towards all goals, pending pt continued progress, pt may need SNF if cannot achieve supervision level of function.  Pt continues to present with significant RLE weakness and requires assist for all mobility.    10/27/14 1500  PT Visit Information  Last PT Received On 10/27/14  Assistance Needed +1  History of Present Illness Pt admitted 3/7 for elective R TKA. Pt with h/o of CKD and breast ca  PT Time Calculation  PT Start Time (ACUTE ONLY) 1513  PT Stop Time (ACUTE ONLY) 1534  PT Time Calculation (min) (ACUTE ONLY) 21 min  Subjective Data  Subjective Pt deferred ambulation due to just returning to bed after ambulating with OT.  Patient Stated Goal home  Precautions  Precautions Fall  Required Braces or Orthoses Knee Immobilizer - Right  Knee Immobilizer - Right On when out of bed or walking  Restrictions  Weight Bearing Restrictions Yes  RLE Weight Bearing WBAT  Pain Assessment  Pain Assessment 0-10  Pain Score 9  Pain Location R knee  Pain Descriptors / Indicators Sharp (Above and below knee joint)  Pain Intervention(s) Monitored during session  Cognition  Arousal/Alertness Awake/alert  Behavior During Therapy WFL for tasks assessed/performed  Overall Cognitive Status Within Functional Limits for tasks assessed  Bed Mobility  General bed mobility comments Pt deferred bed mobility due to just returning to bed after working with OT  Transfers  Overall transfer level (Not assessed during this visit)  General Comments  General comments (skin integrity, edema, etc.) Educated on proper knee alingment in supine, positioned to maintain good frontal plane knee alignment.    Total Joint Exercises  Ankle Circles/Pumps AROM;Both;Strengthening;Other reps (comment);Supine (2 sets of 15)  Quad Sets  AROM;Right;Strengthening;Other reps (comment);Supine (2 sets of 10)  Heel Slides Supine;Other reps (comment);AROM;AAROM (2 sets of 10 (AROM/AAROM))  PT - End of Session  Activity Tolerance Patient tolerated treatment well  Patient left in bed;with call bell/phone within reach  Nurse Communication Mobility status  PT - Assessment/Plan  PT Plan Current plan remains appropriate  PT Frequency (ACUTE ONLY) 7X/week  Follow Up Recommendations Home health PT;Supervision/Assistance - 24 hour  PT equipment 3in1 (PT)  PT Goal Progression  Progress towards PT goals Progressing toward goals (Pt still has significant deficits in quad strength and AROM.)  PT General Charges  $$ ACUTE PT VISIT 1 Procedure  PT Treatments  $Therapeutic Exercise 8-22 mins   Lucas Mallow, SPT (student physical therapist) Office phone: [phone]

## 2014-10-27 NOTE — Evaluation (Signed)
Occupational Therapy Evaluation Patient Details Name: Felicia Carlson MRN: [id number] DOB: 1924/12/27 Today's Date: 10/27/2014    History of Present Illness Pt admitted 3/7 for elective R TKA. Pt with h/o of CKD and breast ca   Clinical Impression   PTA pt lived at home and was independent with use of cane. Pt reports that she will d/c to her daughter's home and will have 24/7 supervision. Pt requires assistance for functional mobility and ADLs due to RLE pain and will benefit from acute OT to promote independence and safety.     Follow Up Recommendations  No OT follow up;Supervision/Assistance - 24 hour    Equipment Recommendations  3 in 1 bedside comode;Other (comment) (RW (youth size))    Recommendations for Other Services       Precautions / Restrictions Precautions Precautions: Fall Required Braces or Orthoses: Knee Immobilizer - Right Knee Immobilizer - Right: On when out of bed or walking Restrictions Weight Bearing Restrictions: Yes RLE Weight Bearing: Weight bearing as tolerated      Mobility Bed Mobility Overal bed mobility: Needs Assistance Bed Mobility: Supine to Sit;Sit to Supine     Supine to sit: Min guard     General bed mobility comments: Pt able to get to EOB without assist and use of bed rail. Pt required min A to return RLE to bed during sit>supine. Educated on use of leg lifter for bed mobility and donning KI.   Transfers Overall transfer level: Needs assistance Equipment used: Rolling walker (2 wheeled) Transfers: Sit to/from Stand Sit to Stand: Min assist         General transfer comment: Good hand placement. Min A to power up to standing from EOB and maintain balance when transitioning hands to RW.     Balance Overall balance assessment: Needs assistance Sitting-balance support: No upper extremity supported;Feet supported Sitting balance-Leahy Scale: Fair     Standing balance support: Bilateral upper extremity supported;During  functional activity Standing balance-Leahy Scale: Poor Standing balance comment: Requires UE support.                             ADL Overall ADL's : Needs assistance/impaired Eating/Feeding: Independent;Sitting   Grooming: Set up;Sitting   Upper Body Bathing: Set up;Sitting   Lower Body Bathing: Sit to/from stand;Moderate assistance   Upper Body Dressing : Set up;Sitting   Lower Body Dressing: Maximal assistance;Sit to/from stand   Toilet Transfer: Minimal assistance;Ambulation;RW Toilet Transfer Details (indicate cue type and reason): sit<>Stand from EOB Toileting- Clothing Manipulation and Hygiene: Maximal assistance;Sit to/from stand       Functional mobility during ADLs: Minimal assistance;Rolling walker General ADL Comments: Pt limited by R knee pain today, but motivated to progress with therapy. SPO2 remained >93% during mobility on RA.      Vision Additional Comments: Pt reports no change from baseline.           Pertinent Vitals/Pain Pain Assessment: 0-10 Pain Score: 9  Pain Location: R knee Pain Descriptors / Indicators: Aching Pain Intervention(s): Limited activity within patient's tolerance;Monitored during session;Repositioned     Hand Dominance Right   Extremity/Trunk Assessment Upper Extremity Assessment Upper Extremity Assessment: Overall WFL for tasks assessed   Lower Extremity Assessment Lower Extremity Assessment: Defer to PT evaluation   Cervical / Trunk Assessment Cervical / Trunk Assessment: Normal   Communication Communication Communication: No difficulties   Cognition Arousal/Alertness: Awake/alert Behavior During Therapy: WFL for tasks assessed/performed Overall Cognitive Status:  Within Functional Limits for tasks assessed                                Home Living Family/patient expects to be discharged to:: Private residence Living Arrangements: Children (going to stay with daughter) Available Help at  Discharge: Family;Available 24 hours/day Type of Home: House Home Access: Level entry     Home Layout: One level     Bathroom Shower/Tub: Occupational psychologist: Standard     Home Equipment: Environmental consultant - 4 wheels;Cane - single point   Additional Comments: pt has a rollator; not a RW      Prior Functioning/Environment Level of Independence: Independent with assistive device(s)        Comments: used cane PTA; pt was a CNA for 40 years    OT Diagnosis: Generalized weakness;Acute pain   OT Problem List: Decreased strength;Decreased range of motion;Decreased activity tolerance;Impaired balance (sitting and/or standing);Decreased knowledge of use of DME or AE;Decreased knowledge of precautions;Pain   OT Treatment/Interventions: Self-care/ADL training;Therapeutic exercise;Energy conservation;DME and/or AE instruction;Therapeutic activities;Patient/family education;Balance training    OT Goals(Current goals can be found in the care plan section) Acute Rehab OT Goals Patient Stated Goal: home OT Goal Formulation: With patient Time For Goal Achievement: 11/03/14 Potential to Achieve Goals: Good ADL Goals Pt Will Perform Grooming: with supervision;standing Pt Will Perform Lower Body Bathing: with supervision;sit to/from stand;with set-up Pt Will Perform Lower Body Dressing: with set-up;with supervision;sit to/from stand Pt Will Transfer to Toilet: with supervision;ambulating;bedside commode Pt Will Perform Toileting - Clothing Manipulation and hygiene: with supervision;sit to/from stand Pt Will Perform Tub/Shower Transfer: Shower transfer;with supervision;ambulating;3 in 1;rolling walker  OT Frequency: Min 2X/week    End of Session Equipment Utilized During Treatment: Gait belt;Right knee immobilizer;Rolling walker CPM Right Knee CPM Right Knee: Off  Activity Tolerance: Patient tolerated treatment well;Patient limited by pain Patient left: in bed;with call bell/phone  within reach;with bed alarm set   Time: 1422-1451 OT Time Calculation (min): 29 min Charges:  OT General Charges $OT Visit: 1 Procedure OT Evaluation $Initial OT Evaluation Tier I: 1 Procedure OT Treatments $Self Care/Home Management : 8-22 mins G-Codes:    Villa Herb M 2014-10-31, 3:09 PM  Cyndie Chime, OTR/L Occupational Therapist [phone] (pager)

## 2014-10-27 NOTE — Progress Notes (Signed)
Subjective: 1 Day Post-Op Procedure(s) (LRB): RIGHT TOTAL KNEE ARTHROPLASTY (Right) Patient reports pain as moderate.  Dong well in CPM.  AM labs not back yet.  Objective: Vital signs in last 24 hours: Temp:  [97 F (36.1 C)-98.2 F (36.8 C)] 97.8 F (36.6 C) (03/09 0428) Pulse Rate:  [60-84] 84 (03/09 0428) Resp:  [13-23] 16 (03/09 0428) BP: (114-179)/(58-96) 114/67 mmHg (03/09 0428) SpO2:  [98 %-100 %] 98 % (03/09 0428) Weight:  [74.844 kg (165 lb)] 74.844 kg (165 lb) (03/08 1212)  Intake/Output from previous day: 03/08 0701 - 03/09 0700 In: 1175 [P.O.:100; I.V.:1075] Out: -  Intake/Output this shift:    No results for input(s): HGB in the last 72 hours. No results for input(s): WBC, RBC, HCT, PLT in the last 72 hours. No results for input(s): NA, K, CL, CO2, BUN, CREATININE, GLUCOSE, CALCIUM in the last 72 hours. No results for input(s): LABPT, INR in the last 72 hours.  Sensation intact distally Intact pulses distally Dorsiflexion/Plantar flexion intact Incision: dressing C/D/I Compartment soft  Assessment/Plan: 1 Day Post-Op Procedure(s) (LRB): RIGHT TOTAL KNEE ARTHROPLASTY (Right) Up with therapy  Felicia Carlson 10/27/2014, 7:20 AM

## 2014-10-27 NOTE — Evaluation (Signed)
Physical Therapy Evaluation Patient Details Name: Felicia Carlson MRN: [id number] DOB: 07-11-1925 Today's Date: 10/27/2014   History of Present Illness  Pt admitted 3/7 for elective R TKA. Pt with h/o of CKD and breast ca  Clinical Impression  Pt is s/p TKA resulting in the deficits listed below (see PT Problem List). Pt became extremely sleepy after receiving IV pain meds. SpO2 dec to low 80s on Room AIR. However pt motivated and tolerated OOB well considering lethargy. Pt will benefit from skilled PT to increase their independence and safety with mobility to allow discharge to the venue listed below.       Follow Up Recommendations Home health PT;Supervision/Assistance - 24 hour    Equipment Recommendations  3in1 (PT)    Recommendations for Other Services Rehab consult     Precautions / Restrictions Precautions Precautions: Fall Precaution Comments: pt with increased lethargy and dec SpO2 within minutes after receiving IV pain meds Required Braces or Orthoses: Knee Immobilizer - Right Knee Immobilizer - Right: On when out of bed or walking Restrictions Weight Bearing Restrictions: Yes RLE Weight Bearing: Weight bearing as tolerated      Mobility  Bed Mobility Overal bed mobility: Needs Assistance Bed Mobility: Supine to Sit     Supine to sit: Min assist     General bed mobility comments: HOB elevated and used of bed rail but pt able to manage LEs  Transfers Overall transfer level: Needs assistance Equipment used: Rolling walker (2 wheeled) Transfers: Sit to/from Stand Sit to Stand: Min assist         General transfer comment: v/c's for safe hand placement and R LE management due to [place] to maintain stability during transition of hands from bed to walker  Ambulation/Gait Ambulation/Gait assistance: Min assist;Mod assist Ambulation Distance (Feet): 15 Feet Assistive device: Rolling walker (2 wheeled) Gait Pattern/deviations: Step-to pattern;Decreased step  length - right;Decreased stance time - right Gait velocity: slow   General Gait Details: max directional v/c's for safe walker managment and sequencing. Pt with delayed processing and difficulty maintaining eye opened due to recent administration of IV pain meds.  Stairs            Wheelchair Mobility    Modified Rankin (Stroke Patients Only)       Balance Overall balance assessment: Needs assistance         Standing balance support: Bilateral upper extremity supported Standing balance-Leahy Scale: Poor Standing balance comment: pt requires use of RW at this time                             Pertinent Vitals/Pain Pain Assessment: 0-10 Pain Score: 10-Worst pain ever Pain Location: R knee Pain Descriptors / Indicators: Aching Pain Intervention(s): RN gave pain meds during session (IV pain meds)    Home Living Family/patient expects to be discharged to:: Private residence Living Arrangements: Alone (but going home with daughter) Available Help at Discharge: Family;Available 24 hours/day Type of Home: House Home Access: Level entry     Home Layout: One level Home Equipment: Walker - 2 wheels;Cane - single point      Prior Function Level of Independence: Independent with assistive device(s)         Comments: used cane PTA     Hand Dominance   Dominant Hand: Right    Extremity/Trunk Assessment   Upper Extremity Assessment: Overall WFL for tasks assessed  Lower Extremity Assessment: RLE deficits/detail RLE Deficits / Details: pt with difficulty completing quad set but was able to initiate active knee flexion    Cervical / Trunk Assessment: Normal  Communication   Communication: No difficulties  Cognition Arousal/Alertness: Awake/alert (but then became sleepy right after) Behavior During Therapy: WFL for tasks assessed/performed Overall Cognitive Status: Within Functional Limits for tasks assessed                       General Comments      Exercises Total Joint Exercises Ankle Circles/Pumps: AROM;Both;10 reps;Supine Quad Sets: AROM;Right;10 reps;Supine (max directional v/c's) Heel Slides: AROM;Right;5 reps;Supine Goniometric ROM: 6-37 active R knee flexion in supine      Assessment/Plan    PT Assessment Patient needs continued PT services  PT Diagnosis Difficulty walking;Abnormality of gait;Generalized weakness;Acute pain   PT Problem List Decreased strength;Decreased range of motion;Decreased activity tolerance;Decreased balance;Decreased knowledge of use of DME;Decreased mobility  PT Treatment Interventions DME instruction;Gait training;Stair training;Functional mobility training;Therapeutic activities;Therapeutic exercise   PT Goals (Current goals can be found in the Care Plan section) Acute Rehab PT Goals Patient Stated Goal: home PT Goal Formulation: With patient Time For Goal Achievement: 11/03/14 Potential to Achieve Goals: Good    Frequency 7X/week   Barriers to discharge        Co-evaluation               End of Session Equipment Utilized During Treatment: Gait belt Activity Tolerance: Patient tolerated treatment well Patient left: in chair;with call bell/phone within reach Nurse Communication: Mobility status (lethargy and dec SPO2 on room air)         Time: 7510-2585 PT Time Calculation (min) (ACUTE ONLY): 32 min   Charges:   PT Evaluation $Initial PT Evaluation Tier I: 1 Procedure PT Treatments $Gait Training: 8-22 mins   PT G CodesKingsley Callander 10/27/2014, 9:17 AM   Kittie Plater, PT, DPT Pager #: [phone] Office #: [phone]

## 2014-10-27 NOTE — Progress Notes (Signed)
Utilization review completed.  

## 2014-10-27 NOTE — Op Note (Signed)
NAME:  Felicia Carlson, Felicia Carlson                   ACCOUNT NO.:  [id number]  MEDICAL RECORD NO.:  [id number]  LOCATION:  5N15C                        FACILITY:  [facility]  PHYSICIAN:  Lind Guest. Ninfa Linden, M.D.DATE OF BIRTH:  Mar 25, 1925  DATE OF PROCEDURE:  10/26/2014 DATE OF DISCHARGE:                              OPERATIVE REPORT   PREOPERATIVE DIAGNOSIS:  Primary osteoarthritis and degenerative joint disease with valgus deformity, right knee.  POSTOPERATIVE DIAGNOSIS:  Primary osteoarthritis and degenerative joint disease with valgus deformity, right knee.  PROCEDURE:  Right total knee arthroplasty.  IMPLANTS:  [employer] knee with size 3 Oxinium femur, size 2 tibial tray, 9 mm fixed bearing constrained polyethylene insert, size 29 patellar button.  SURGEON:  Lind Guest. Ninfa Linden, M.D.  ASSISTANT:  Erskine Emery, P.A.  ANESTHESIA: 1. General. 2. Right leg regional anesthesia.  TOURNIQUET TIME:  1 hour.  ESTIMATED BLOOD LOSS:  Less than 100 mL.  COMPLICATIONS:  None.  INDICATIONS:  Ms. Wales is an 81 year old female whom I have followed for a long period of time.  She has severe valgus deformities of both of her knees, primary osteoarthritis, it is end stage.  She has complete loss of lateral joint space bilaterally with patellofemoral arthritic changes, medial compartment changes, complete loss of the joint space, and now this is affecting her mobility, her quality of life, and her activities of daily living.  She has failed all modes of conservative treatment at this point.  She and her family wished to proceed with a total knee arthroplasty.  They understand the risk of acute blood loss anemia, nerve and vessel injury, fracture, infection, DVT.  She understands the goals are to decrease pain, improve mobility, and overall improved quality of life.  PROCEDURE DESCRIPTION:  After informed consent was obtained, appropriate right knee was marked.  She was brought to  the operating room, placed supine on the operating table.  General anesthesia was then obtained.  A nonsterile tourniquet was placed around her upper right thigh and her right leg was prepped and draped with DuraPrep and sterile drapes including sterile stockinette.  A time-out was called to identify correct patient, correct right knee.  We then used an Esmarch to wrap out the leg and tourniquet was inflated to 300 mmHg of pressure.  I then made a direct midline incision over the patella and carried this proximally and distally.  I dissected in the knee joint and performed a medial parapatellar arthrotomy.  We performed a lateral release for patellar tracking and then was able to flex the knee up and cleaned the knee of the remnants of ACL, PCL, medial and lateral meniscus as well as osteophytes about the knee.  We then used extramedullary cutting guide for our tibial cut and with the knee in a flexed position, set our cut by taking 9 mm off the high side which was the medial side.  We corrected her varus valgus and a neutral slope, and then made that cut without difficulty.  We then went to the femur and used intramedullary guide through the femoral notch.  We set our distal femoral cut for a 9 mm with 5 degrees right  externally rotated.  We then made this cut and brought the knee back down to extension with our 9 mm extension block. I felt we brought her out of her valgus and had improved her alignment. We went then back to the femur per femoral sizing guide and chose a size 3 femur, we put 4-in-1 cutting block for size 3 femur, made our anterior and posterior cuts followed by our chamfer cuts.  We then made our femoral box cut.  Attention was then turned back to the tibia.  We sized for a size 2 tibial tray and made our keel punch off this and drilled for our central post.  We then put the 2 tibial tray followed by the size 3 femur and a 9 mm fixed bearing polyethylene insert  which constrained.  We brought the knee back down in extension for flexion and extension.  I was pleased with her alignment and her mobility.  We then co-planed under surface of the patella and made our cut, so we could trial size 29 patellar button.  I then removed all trial components.  We irrigated the knee with normal saline solution and then mixed our cement.  We then cemented the real [employer] tibial tray size 2 followed by the real size 3 [employer] femur.  We placed the real fixed bearing constrained 9 mm polyethylene insert and cemented our patellar button.  Once our cement had dried and hardened, we removed cement debris from the knee.  We then let the tourniquet down and hemostasis was obtained with electrocautery.  We then irrigated the knee again with normal saline solution and closed our arthrotomy with interrupted #1 Vicryl suture followed by 0 Vicryl in the deep tissue, 2- 0 Vicryl in the subcutaneous tissue, and interrupted staples on the skin.  Xeroform and a well-padded sterile dressing were applied.  She was taken off the table, awakened, extubated, taken to the recovery room in stable condition.  All final counts were correct.  There were no complications noted.  Of note, Erskine Emery, P.A., assisted in the entire case and his assistance was crucial for facilitating all aspects of this case.     Lind Guest. Ninfa Linden, M.D.     CYB/MEDQ  D:  10/26/2014  T:  10/27/2014  Job:  [id number]

## 2014-10-27 NOTE — Discharge Instructions (Addendum)
Information on my medicine - XARELTO (Rivaroxaban)  This medication education was reviewed with me or my healthcare representative as part of my discharge preparation.  The pharmacist that spoke with me during my hospital stay was:  Eudelia Bunch, [facility]  Why was Xarelto prescribed for you? Xarelto was prescribed for you to reduce the risk of blood clots forming after orthopedic surgery. The medical term for these abnormal blood clots is venous thromboembolism (VTE).  What do you need to know about xarelto ? Take your Xarelto ONCE DAILY at the same time every day. You may take it either with or without food.  If you have difficulty swallowing the tablet whole, you may crush it and mix in applesauce just prior to taking your dose.  Take Xarelto exactly as prescribed by your doctor and DO NOT stop taking Xarelto without talking to the doctor who prescribed the medication.  Stopping without other VTE prevention medication to take the place of Xarelto may increase your risk of developing a clot.  After discharge, you should have regular check-up appointments with your healthcare provider that is prescribing your Xarelto.    What do you do if you miss a dose? If you miss a dose, take it as soon as you remember on the same day then continue your regularly scheduled once daily regimen the next day. Do not take two doses of Xarelto on the same day.   Important Safety Information A possible side effect of Xarelto is bleeding. You should call your healthcare provider right away if you experience any of the following: ? Bleeding from an injury or your nose that does not stop. ? Unusual colored urine (red or dark brown) or unusual colored stools (red or black). ? Unusual bruising for unknown reasons. ? A serious fall or if you hit your head (even if there is no bleeding).  Some medicines may interact with Xarelto and might increase your risk of bleeding while on Xarelto. To help avoid  this, consult your healthcare provider or pharmacist prior to using any new prescription or non-prescription medications, including herbals, vitamins, non-steroidal anti-inflammatory drugs (NSAIDs) and supplements.  This website has more information on Xarelto: [email or web address]   FULL WEIGHT BEARING AS TOLERATED RIGHT KNEE. WORK ON KNEE MOTION. CAN GET CURRENT KNEE DRESSING WET DAILY IN THE SHOWER

## 2014-10-27 NOTE — Progress Notes (Signed)
Orthopedic Tech Progress Note Patient Details:  Felicia Carlson 1925-01-04 [id number] On cpm at 7:15 pm Patient ID: Felicia Carlson, female   DOB: 03-15-1925, 81 y.o.   MRN: [id number]   Braulio Bosch 10/27/2014, 7:17 PM

## 2014-10-28 ENCOUNTER — Encounter (HOSPITAL_COMMUNITY): Payer: Self-pay | Admitting: Orthopaedic Surgery

## 2014-10-28 LAB — CBC
HEMATOCRIT: 27.3 % — AB (ref 36.0–46.0)
Hemoglobin: 9.7 g/dL — ABNORMAL LOW (ref 12.0–15.0)
MCH: 31.5 pg (ref 26.0–34.0)
MCHC: 35.5 g/dL (ref 30.0–36.0)
MCV: 88.6 fL (ref 78.0–100.0)
Platelets: 121 10*3/uL — ABNORMAL LOW (ref 150–400)
RBC: 3.08 MIL/uL — ABNORMAL LOW (ref 3.87–5.11)
RDW: 12.8 % (ref 11.5–15.5)
WBC: 15.5 10*3/uL — AB (ref 4.0–10.5)

## 2014-10-28 MED ORDER — OXYCODONE-ACETAMINOPHEN 5-325 MG PO TABS
1.0000 | ORAL_TABLET | ORAL | Status: DC | PRN
Start: 2014-10-28 — End: 2018-07-17

## 2014-10-28 MED ORDER — RIVAROXABAN 10 MG PO TABS: 10.0000 mg | ORAL_TABLET | Freq: Every day | ORAL | Status: DC

## 2014-10-28 NOTE — Progress Notes (Addendum)
Physical Therapy Treatment Patient Details Name: Felicia Carlson MRN: [id number] DOB: [birth year] Today's Date: 10/28/2014    History of Present Illness Pt admitted 3/7 for elective R TKA. Pt with h/o of CKD and breast ca    PT Comments    Pt was received sitting in chair. Pt reported bilateral foot numbness, and had decreased sensation in L medial calf and arch of foot (S1 dermatome).  Pt reports lumbar/sacral problems prior to knee sx.  Continue to monitor s/s. Pt tolerated treatment well and with less lethargy this afternoon.  Pt able to ambulate 75 feet with rolling walker, but still needs v/c with ambulation and transfers sit to stand.  Pt will benefit from skilled therapy to address strength, ROM and balance/proprioception deficits.    Follow Up Recommendations  Home health PT;Supervision/Assistance - 24 hour (Will speak to live in family members tomorrow)     Equipment Recommendations    Rolling walker   Recommendations for Other Services       Precautions / Restrictions Precautions Precautions: Fall Required Braces or Orthoses: Knee Immobilizer - Right Knee Immobilizer - Right: On when out of bed or walking Restrictions Weight Bearing Restrictions: Yes RLE Weight Bearing: Weight bearing as tolerated    Mobility  Bed Mobility Overal bed mobility: Needs Assistance Bed Mobility: Supine to Sit     Supine to sit: Modified independent (Device/Increase time) Sit to supine: Min assist   General bed mobility comments: Pt independently sat up with use of hand rails, but needed help lifting legs into bed for sit to supine  Transfers Overall transfer level: Needs assistance Equipment used: Rolling walker (2 wheeled) Transfers: Sit to/from Stand Sit to Stand: Min guard         General transfer comment: v/c's still necessary for safe hand placement   Ambulation/Gait Ambulation/Gait assistance: Min assist Ambulation Distance (Feet): 75 Feet Assistive device: Rolling  walker (2 wheeled) Gait Pattern/deviations: Step-to pattern;Decreased step length - right;Decreased step length - left;Shuffle     General Gait Details: Still demonstrated difficulty with walker management and turning; still needed v/c for posture and to decrease forward lean with ambulation   Stairs            Wheelchair Mobility    Modified Rankin (Stroke Patients Only)       Balance Overall balance assessment: Needs assistance         Standing balance support: Bilateral upper extremity supported Standing balance-Leahy Scale: Poor Standing balance comment: Pt still requires UE support and contact guard                    Cognition Arousal/Alertness: Awake/alert Behavior During Therapy: WFL for tasks assessed/performed Overall Cognitive Status: Within Functional Limits for tasks assessed                      Exercises Total Joint Exercises Ankle Circles/Pumps: AROM;20 reps;Seated Quad Sets: 10 reps;AROM;Seated Heel Slides: AROM;10 reps;Seated Goniometric ROM: 10-30    General Comments General comments (skin integrity, edema, etc.): Moderate/max v/c's needed for safe ambulation with walker and transfers to and from standing      Pertinent Vitals/Pain Pain Assessment: 0-10 Pain Score: 7  Pain Location: R knee Pain Descriptors / Indicators: Aching Pain Intervention(s): Monitored during session    Home Living                      Prior Function  PT Goals (current goals can now be found in the care plan section) Progress towards PT goals: Progressing toward goals    Frequency  7X/week    PT Plan Current plan remains appropriate    Co-evaluation             End of Session Equipment Utilized During Treatment: Gait belt;Right knee immobilizer Activity Tolerance: Patient tolerated treatment well Patient left: in bed;in CPM;with call bell/phone within reach;with family/visitor present     Time: 8527-7824 PT  Time Calculation (min) (ACUTE ONLY): 46 min  Charges:  $Gait Training: 8-22 mins $Self Care/Home Management: 8-22                    G Codes:      Areya Lemmerman 2014-11-23, 3:38 PM  Lucas Mallow, SPT (student physical therapist) Office phone: [phone]

## 2014-10-28 NOTE — Progress Notes (Signed)
Subjective: 2 Days Post-Op Procedure(s) (LRB): RIGHT TOTAL KNEE ARTHROPLASTY (Right) Patient reports pain as moderate.  Working slowly with therapy.  Wants to go home with family tomorrow.  Acute blood loss anemia from surgery, but asymptomatic.  Objective: Vital signs in last 24 hours: Temp:  [97.8 F (36.6 C)-99.8 F (37.7 C)] 99.8 F (37.7 C) (03/10 0555) Pulse Rate:  [77-95] 87 (03/10 0555) Resp:  [16-18] 18 (03/10 0800) BP: (127-156)/(60-65) 127/61 mmHg (03/10 0555) SpO2:  [95 %-100 %] 100 % (03/10 0555)  Intake/Output from previous day: 03/09 0701 - 03/10 0700 In: 240 [P.O.:240] Out: -  Intake/Output this shift:     Recent Labs  10/27/14 1201 10/28/14 0737  HGB 11.0* 9.7*    Recent Labs  10/27/14 1201 10/28/14 0737  WBC 9.1 15.5*  RBC 3.57* 3.08*  HCT 32.9* 27.3*  PLT PLATELET CLUMPS NOTED ON SMEAR, UNABLE TO ESTIMATE 121*    Recent Labs  10/27/14 1201  NA 135  K 3.8  CL 99  CO2 26  BUN 17  CREATININE 0.92  GLUCOSE 173*  CALCIUM 8.8   No results for input(s): LABPT, INR in the last 72 hours.  Sensation intact distally Intact pulses distally Dorsiflexion/Plantar flexion intact Incision: no drainage No cellulitis present Compartment soft  Assessment/Plan: 2 Days Post-Op Procedure(s) (LRB): RIGHT TOTAL KNEE ARTHROPLASTY (Right) Up with therapy Plan for discharge tomorrow  [name] 10/28/2014, 9:56 AM

## 2014-10-28 NOTE — Progress Notes (Signed)
Physical Therapy Treatment Patient Details Name: Felicia Carlson MRN: [id number] DOB: 10-08-1924 Today's Date: 10/28/2014    History of Present Illness Pt admitted 3/7 for elective R TKA. Pt with h/o of CKD and breast ca    PT Comments    Pt initially tolerated treatment well, but was limited by fatigue/lethargy towards end of session.  Pt still not able to perform a quad set without max v/c's and t/c's. Pt tolerated ambulating 35 feet, but needed mod to max v/c'ing for posture and walker management.  Will reassess patient this afternoon to address any necessary changes in discharge planning.     Follow Up Recommendations  Home health PT;Supervision/Assistance - 24 hour;Other (comment) (SNF if level of assistance is not appropriate at home)     Equipment Recommendations    Rolling walker   Recommendations for Other Services       Precautions / Restrictions Precautions Precautions: Fall Required Braces or Orthoses: Knee Immobilizer - Right Knee Immobilizer - Right: On when out of bed or walking Restrictions Weight Bearing Restrictions: Yes RLE Weight Bearing: Weight bearing as tolerated    Mobility  Bed Mobility Overal bed mobility: Needs Assistance Bed Mobility: Sit to Supine       Sit to supine: Mod assist   General bed mobility comments: Pt needed legs lifted to get from sit to supine  Transfers Overall transfer level: Needs assistance Equipment used: Rolling walker (2 wheeled) Transfers: Sit to/from Stand Sit to Stand: Min guard         General transfer comment: v/c's needed for safe hand placement; v/c's needed with ambulation and transfers to maintain safety and proper body position within walker  Ambulation/Gait Ambulation/Gait assistance: Min assist Ambulation Distance (Feet): 35 Feet Assistive device: Rolling walker (2 wheeled) Gait Pattern/deviations: Step-to pattern;Decreased step length - right;Decreased step length - left;Decreased stance time -  right     General Gait Details: Difficulty with walker management and turning; needed v/c's throughout for posture and walker management   Stairs            Wheelchair Mobility    Modified Rankin (Stroke Patients Only)       Balance Overall balance assessment: Needs assistance Sitting-balance support: No upper extremity supported Sitting balance-Leahy Scale: Fair     Standing balance support: Bilateral upper extremity supported Standing balance-Leahy Scale: Poor Standing balance comment: Requires UE support and contact guard                    Cognition Arousal/Alertness: Awake/alert (Lethargic towards the end of treatment) Behavior During Therapy: WFL for tasks assessed/performed Overall Cognitive Status: Within Functional Limits for tasks assessed                      Exercises Total Joint Exercises Ankle Circles/Pumps: AROM;Both;20 reps;Seated Quad Sets: Seated;10 reps;AROM;Right    General Comments General comments (skin integrity, edema, etc.): Educated on knee alignment in supine, positioned at end of treatment to maintain good frontal plane alighment; moderate/max v/c's needed for safe ambulation and transfers      Pertinent Vitals/Pain Pain Assessment: 0-10 Pain Score: 8  Pain Location: R knee Pain Descriptors / Indicators: Aching Pain Intervention(s): Monitored during session (7/10 post ambulation)    Home Living                      Prior Function            PT Goals (current goals  can now be found in the care plan section) Progress towards PT goals: PT to reassess next treatment    Frequency  7X/week    PT Plan Current plan remains appropriate (Will reevaluate progress this afternoon)    Co-evaluation             End of Session Equipment Utilized During Treatment: Gait belt Activity Tolerance: Patient limited by lethargy (Tolerated 1st half of session well; limited by fatigue at en) Patient left: with  call bell/phone within reach;in bed     Time: 4585-9292 PT Time Calculation (min) (ACUTE ONLY): 36 min  Charges:                       G Codes:      Kadince Boxley 10/29/14, 12:46 PM  Lucas Mallow, SPT (student physical therapist) Office phone: [phone]

## 2014-10-29 NOTE — Progress Notes (Signed)
Occupational Therapy Treatment Patient Details Name: Felicia Carlson MRN: [id number] DOB: 30-Jul-1925 Today's Date: 10/29/2014    History of present illness Pt admitted 3/7 for elective R TKA. Pt with h/o of CKD and breast ca   OT comments  Pt with decline in function this session form last session. Family present during tx today. Pt lethargic and having difficulty following commands, disoriented to place and situation. Pt required increased time  with verbal and physical cues for initiationto complete tasks  and for using RW safely. Pt's nurse aware of decline in function. Pt to continue with acute OT sevrices to increase level of function and safety  Follow Up Recommendations  No OT follow up;Supervision/Assistance - 24 hour    Equipment Recommendations  3 in 1 bedside comode;Other (comment) (youth RW)    Recommendations for Other Services      Precautions / Restrictions Precautions Precautions: Fall Precaution Comments: pt with increased lethargy  Required Braces or Orthoses: Knee Immobilizer - Right Knee Immobilizer - Right: On when out of bed or walking Restrictions Weight Bearing Restrictions: Yes RLE Weight Bearing: Weight bearing as tolerated       Mobility Bed Mobility Overal bed mobility: Needs Assistance Bed Mobility: Supine to Sit     Supine to sit: Min assist;HOB elevated     General bed mobility comments: pt up in recliner, slow processing to scoot to edge of chair in prep for sit - stand  Transfers Overall transfer level: Needs assistance Equipment used: Rolling walker (2 wheeled) Transfers: Sit to/from Stand Sit to Stand: Mod assist         General transfer comment: verbal and physical cues for safety and direction. halfway to bathroom from recliner pt began to drag and scissor feet with RW too far out in front of her    Balance   Sitting-balance support: No upper extremity supported;Feet supported Sitting balance-Leahy Scale: Fair     Standing  balance support: Bilateral upper extremity supported;During functional activity Standing balance-Leahy Scale: Poor                     ADL       Grooming: Set up;Sitting;Wash/dry face;Wash/dry hands;Oral care   Upper Body Bathing: Sitting;Moderate assistance   Lower Body Bathing: Sit to/from stand;Moderate assistance           Toilet Transfer: Ambulation;RW;Moderate assistance;+2 for physical assistance;Cueing for safety;Cueing for sequencing   Toileting- Clothing Manipulation and Hygiene: Sit to/from stand;Moderate assistance;Cueing for safety;Cueing for sequencing         General ADL Comments: pt limited due to lethargy and difficulty processing  and following commands. Required increased time to compelete tasks      Vision  no change from baseline                              Cognition   Behavior During Therapy: [facility] for tasks assessed/performed Overall Cognitive Status: Impaired/Different from baseline Area of Impairment: Orientation;Memory;Following commands;Safety/judgement;Problem solving Orientation Level: Disoriented to;Time   Memory: Decreased short-term memory  Following Commands: Follows one step commands inconsistently;Follows one step commands with increased time Safety/Judgement: Decreased awareness of safety   Problem Solving: Slow processing;Decreased initiation;Difficulty sequencing;Requires verbal cues General Comments: Upon arrival, noted patient's boiled egg, butter, and jelly in bed.  Patient having difficulty processing basic mobility.  Required max verbal cues and mod assist to turn around and sit on toilet.  Same issue with returning  to chair.  Required multiple repeated cues for keeping both hands on RW and to stay inside of RW.      Extremity/Trunk Assessment   WFL                        General Comments  pt very pleasant and cooperative    Pertinent Vitals/ Pain       Pain Assessment: 0-10 Pain Score: 6   Pain Location: R knee Pain Descriptors / Indicators: Aching Pain Intervention(s): Limited activity within patient's tolerance;Monitored during session;Repositioned  Home Living  home alone                                        Prior Functioning/Environment  independent           Frequency Min 2X/week     Progress Toward Goals  OT Goals(current goals can now be found in the care plan section)  Progress towards OT goals: OT to reassess next treatment     Plan Discharge plan remains appropriate                     End of Session Equipment Utilized During Treatment: Gait belt;Right knee immobilizer;Rolling walker   Activity Tolerance Patient tolerated treatment well;Patient limited by pain;Patient limited by lethargy   Patient Left in chair;with call bell/phone within reach;with family/visitor present   Nurse Communication  decline in function        Time: 1240-1313 OT Time Calculation (min): 33 min  Charges: OT General Charges $OT Visit: 1 Procedure OT Treatments $Self Care/Home Management : 8-22 mins $Therapeutic Activity: 8-22 mins  Britt Bottom 10/29/2014, 1:35 PM

## 2014-10-29 NOTE — Progress Notes (Addendum)
Physical Therapy Progress Note  Clinical Impression:  Patient continues to have difficulty with increased lethargy and difficulty processing verbal cues for mobility.  Decline in mobility today vs yesterday's PT sessions - RN made aware.   10/29/14 1437  PT Visit Information  Last PT Received On 10/29/14  Assistance Needed +1  History of Present Illness Pt admitted 3/7 for elective R TKA. Pt with h/o of CKD and breast ca  PT Time Calculation  PT Start Time (ACUTE ONLY) 1356  PT Stop Time (ACUTE ONLY) 1411  PT Time Calculation (min) (ACUTE ONLY) 15 min  Subjective Data  Subjective "I'm tired"  Precautions  Precautions Fall  Precaution Comments pt with increased lethargy   Required Braces or Orthoses Knee Immobilizer - Right  Knee Immobilizer - Right On when out of bed or walking  Restrictions  Weight Bearing Restrictions Yes  RLE Weight Bearing WBAT  Pain Assessment  Pain Assessment 0-10  Pain Score 6  Pain Location Lt knee  Pain Descriptors / Indicators Aching;Sore  Pain Intervention(s) Limited activity within patient's tolerance;Repositioned  Cognition  Arousal/Alertness Awake/alert;Lethargic  Behavior During Therapy WFL for tasks assessed/performed  Overall Cognitive Status Impaired/Different from baseline  Area of Impairment Orientation;Memory;Following commands;Safety/judgement;Problem solving  Orientation Level Disoriented to;Time  Memory Decreased short-term memory  Following Commands Follows one step commands inconsistently;Follows one step commands with increased time  Safety/Judgement Decreased awareness of safety  Problem Solving Slow processing;Decreased initiation;Difficulty sequencing;Requires verbal cues  General Comments Patient continued to have difficulty processing cues provided for functional mobility (unable to "figure out" how to back up to bed to sit down.  Bed Mobility  Overal bed mobility Needs Assistance  Bed Mobility Sit to Supine  Sit to supine  Mod assist  General bed mobility comments Assist to control trunk to bed, and to move LE's onto bed.  Transfers  Overall transfer level Needs assistance  Equipment used Rolling walker (2 wheeled)  Transfers Sit to/from Stand  Sit to Stand Mod assist  General transfer comment Verbal cues for hand placement.  Assist to power up to standing.  Cues to stand upright.  Ambulation/Gait  Ambulation/Gait assistance Min assist  Ambulation Distance (Feet) 20 Feet  Assistive device Rolling walker (2 wheeled)  Gait Pattern/deviations Step-to pattern;Decreased stance time - right;Decreased step length - left;Decreased stride length;Decreased weight shift to right;Antalgic;Trunk flexed  Gait velocity slow  Gait velocity interpretation Below normal speed for age/gender  General Gait Details Verbal cues to stand upright and for proper use of RW.  Required assist to maneuver RW during turns.  Patient with flexed posture, making it difficult to take steps.  Patient continued to have increased difficulty getting into position to sit on bed.  Verbal cues for patient to step backward toward bed, and patient stepping forward, turning to side, etc.  Difficulty processing verbal cues for mobility.  PT - End of Session  Equipment Utilized During Treatment Gait belt;Right knee immobilizer  Activity Tolerance Patient limited by pain;Patient limited by lethargy  Patient left in bed;with call bell/phone within reach;with family/visitor present  Nurse Communication Mobility status (Continues to have decreased cognition)  PT - Assessment/Plan  PT Plan Current plan remains appropriate  PT Frequency (ACUTE ONLY) 7X/week  Follow Up Recommendations Home health PT;Supervision/Assistance - 24 hour  PT equipment 3in1 (PT)  PT Goal Progression  Progress towards PT goals Not progressing toward goals - comment (Lethargic and decreased cognition)  PT General Charges  $$ ACUTE PT VISIT 1 Procedure  PT Treatments  $  Gait  Training 8-22 mins  Carita Pian. Sanjuana Kava, [place] Pager [phone]

## 2014-10-29 NOTE — Care Management Note (Signed)
CARE MANAGEMENT NOTE 10/29/2014  Patient:  Felicia Carlson, Felicia Carlson   Account Number:  [id number]  Date Initiated:  10/27/2014  Documentation initiated by:  [facility]  Subjective/Objective Assessment:   s/p rt total knee arthroplasty     Action/Plan:   PT/OT evals- recommended HHPT, rolling walker and 3N1   Anticipated DC Date:  10/28/2014   Anticipated DC Plan:  [place]  CM consult      [facility] Choice  [place]   Choice offered to / List presented to:  C-1 Patient   DME arranged  3-N-1  [place]      DME agency  [employer]     [place] arranged  HH-2 PT      [place].   Status of service:  In process, will continue to follow Medicare Important Message given?   (If response is "NO", the following Medicare IM given date fields will be blank) Date Medicare IM given:   Medicare IM given by:   Date Additional Medicare IM given:  10/29/2014 Additional Medicare IM given by:  Norina Buzzard  Discharge Disposition:  [place]  Per UR Regulation:    If discussed at Long Length of Stay Meetings, dates discussed:    Comments:  10/27/14 Spoke with patient about [facility], she selected [facility]. Patient states that she will be going to stay with her daughter in [place] but does not have the address.Contacted Miranda at [facility] and set up [place]. Contacted Brent with [facility] and requested 3N1 and rolling walker.He will deliver them to patient's room on 10/28/14. Will continue to follow until discharge.

## 2014-10-29 NOTE — Progress Notes (Signed)
Physical Therapy Treatment Patient Details Name: Felicia Carlson MRN: [id number] DOB: 1924/12/25 Today's Date: 10/29/2014    History of Present Illness Pt admitted 3/7 for elective R TKA. Pt with h/o of CKD and breast ca    PT Comments    Patient having difficulty with mobility and processing cues for mobility.  Required mod assist at times for mobility.  Question if pain was limiting mobility today.  Patient reports she is going to daughter's home at discharge, and daughter will be with her for 24 hour assist.   Follow Up Recommendations  Home health PT;Supervision/Assistance - 24 hour (Patient reports she is going to daughter's home)     Equipment Recommendations  3in1 (PT)    Recommendations for Other Services       Precautions / Restrictions Precautions Precautions: Fall Required Braces or Orthoses: Knee Immobilizer - Right Knee Immobilizer - Right: On when out of bed or walking Restrictions Weight Bearing Restrictions: Yes RLE Weight Bearing: Weight bearing as tolerated    Mobility  Bed Mobility Overal bed mobility: Needs Assistance Bed Mobility: Supine to Sit     Supine to sit: Min assist;HOB elevated     General bed mobility comments: Required verbal cues for technique.  Assist to bring RLE off of bed.  Cues to maintain upright balance in sitting.  Transfers Overall transfer level: Needs assistance Equipment used: Rolling walker (2 wheeled) Transfers: Sit to/from Stand Sit to Stand: Mod assist         General transfer comment: Verbal cues for hand placement.  Required mod assist to rise to standing today.  Cues to stand upright - patient maintains flexed posture with head down.  When moving toward toilet and chair to sit, patient let go of RW and reached for rail/armrest while still turning.  Repeated cuing to keep hands on RW until ready to sit.  Patient required mod assist for RW placement and max verbal cues to turn to sit onto  toilet.  Ambulation/Gait Ambulation/Gait assistance: Min assist;Mod assist Ambulation Distance (Feet): 24 Feet Assistive device: Rolling walker (2 wheeled) Gait Pattern/deviations: Step-through pattern;Decreased stance time - right;Decreased step length - left;Decreased step length - right;Decreased weight shift to right;Antalgic;Trunk flexed Gait velocity: slow Gait velocity interpretation: Below normal speed for age/gender General Gait Details: Verbal cues to stand upright and for proper use of RW.  Required assist to maneuver RW during turns.  Patient with flexed posture, making it difficult to take steps.   Stairs            Wheelchair Mobility    Modified Rankin (Stroke Patients Only)       Balance           Standing balance support: Bilateral upper extremity supported Standing balance-Leahy Scale: Poor                      Cognition Arousal/Alertness: Awake/alert Behavior During Therapy: WFL for tasks assessed/performed Overall Cognitive Status: Impaired/Different from baseline Area of Impairment: Orientation;Memory;Following commands;Safety/judgement;Problem solving Orientation Level: Disoriented to;Time   Memory: Decreased short-term memory Following Commands: Follows one step commands inconsistently;Follows one step commands with increased time Safety/Judgement: Decreased awareness of safety   Problem Solving: Slow processing;Decreased initiation;Difficulty sequencing;Requires verbal cues General Comments: Upon arrival, noted patient's boiled egg, butter, and jelly in bed.  Patient having difficulty processing basic mobility.  Required max verbal cues and mod assist to turn around and sit on toilet.  Same issue with returning to chair.  Required  multiple repeated cues for keeping both hands on RW and to stay inside of RW.      Exercises      General Comments        Pertinent Vitals/Pain Pain Assessment: 0-10 Pain Score: 10-Worst pain  ever Pain Location: Rt knee Pain Descriptors / Indicators: Aching Pain Intervention(s): Limited activity within patient's tolerance;Repositioned;Patient requesting pain meds-RN notified;RN gave pain meds during session    Home Living                      Prior Function            PT Goals (current goals can now be found in the care plan section) Progress towards PT goals: Not progressing toward goals - comment (Difficulty following directions today. ?Pain limiting mob)    Frequency  7X/week    PT Plan Current plan remains appropriate    Co-evaluation             End of Session Equipment Utilized During Treatment: Gait belt;Right knee immobilizer Activity Tolerance: Patient limited by pain;Patient limited by fatigue Patient left: in chair;with call bell/phone within reach     Time: 1026-1101 PT Time Calculation (min) (ACUTE ONLY): 35 min  Charges:  $Gait Training: 23-37 mins                    G Codes:      Despina Pole 2014-11-27, 11:32 AM Carita Pian. Sanjuana Kava, [place] Pager [phone]

## 2014-10-29 NOTE — Discharge Summary (Signed)
Patient ID: Felicia Carlson MRN: [id number] DOB/AGE: Aug 30, 1924 81 y.o.  Admit date: 10/26/2014 Discharge date: 10/29/2014  Admission Diagnoses:  Principal Problem:   Osteoarthritis of right knee Active Problems:   Status post total right knee replacement   Discharge Diagnoses:  Same  Past Medical History  Diagnosis Date  . Chest pain   . Dyspnea   . Hypertension   . Chronic kidney disease     stent in kidney right side  . Cancer     breast cancer right side  . GERD (gastroesophageal reflux disease)     sometimes  . Arthritis   . Headache     shingles right side of head and right eye    Surgeries: Procedure(s): RIGHT TOTAL KNEE ARTHROPLASTY on 10/26/2014   Consultants:    Discharged Condition: Improved  Hospital Course: Felicia Carlson is an 81 y.o. female who was admitted 10/26/2014 for operative treatment ofOsteoarthritis of right knee. Patient has severe unremitting pain that affects sleep, daily activities, and work/hobbies. After pre-op clearance the patient was taken to the operating room on 10/26/2014 and underwent  Procedure(s): RIGHT TOTAL KNEE ARTHROPLASTY.    Patient was given perioperative antibiotics: Anti-infectives    Start     Dose/Rate Route Frequency Ordered Stop   10/26/14 2100  ceFAZolin (ANCEF) IVPB 1 g/50 mL premix     1 g 100 mL/hr over 30 Minutes Intravenous Every 6 hours 10/26/14 1912 10/27/14 0426   10/26/14 0600  ceFAZolin (ANCEF) IVPB 2 g/50 mL premix     2 g 100 mL/hr over 30 Minutes Intravenous On call to O.R. 10/25/14 1325 10/26/14 1542       Patient was given sequential compression devices, early ambulation, and chemoprophylaxis to prevent DVT.  Patient benefited maximally from hospital stay and there were no complications.    Recent vital signs: Patient Vitals for the past 24 hrs:  BP Temp Pulse Resp SpO2  10/29/14 0600 (!) 130/54 mmHg 98.4 F (36.9 C) 75 14 95 %  10/29/14 0355 - - - 16 94 %  10/29/14 0000 - - - 16 94 %  10/28/14 2030  (!) 95/44 mmHg 98.3 F (36.8 C) 90 16 93 %  10/28/14 2000 - - - 16 95 %  10/28/14 1600 - - - 18 -  10/28/14 1300 109/61 mmHg 97.9 F (36.6 C) 82 18 91 %  10/28/14 0800 - - - 18 -     Recent laboratory studies:  Recent Labs  10/27/14 1201 10/28/14 0737  WBC 9.1 15.5*  HGB 11.0* 9.7*  HCT 32.9* 27.3*  PLT PLATELET CLUMPS NOTED ON SMEAR, UNABLE TO ESTIMATE 121*  NA 135  --   K 3.8  --   CL 99  --   CO2 26  --   BUN 17  --   CREATININE 0.92  --   GLUCOSE 173*  --   CALCIUM 8.8  --      Discharge Medications:     Medication List    STOP taking these medications        naproxen sodium 220 MG tablet  Commonly known as:  ANAPROX      TAKE these medications        ALOE VERA PO  Take 1 tablet by mouth daily.     APPLE CIDER VINEGAR PO  Take 1 tablet by mouth daily.     carvedilol 12.5 MG tablet  Commonly known as:  COREG  Take 12.5 mg by mouth 2 (  two) times daily with a meal.     furosemide 40 MG tablet  Commonly known as:  LASIX  Take 40 mg by mouth daily.     HAWTHORNE BERRY PO  Take 1 tablet by mouth daily.     oxyCODONE-acetaminophen 5-325 MG per tablet  Commonly known as:  ROXICET  Take 1-2 tablets by mouth every 4 (four) hours as needed.     rivaroxaban 10 MG Tabs tablet  Commonly known as:  XARELTO  Take 1 tablet (10 mg total) by mouth daily with breakfast.        Diagnostic Studies: Dg Chest 2 View  10/19/2014   CLINICAL DATA:  Preoperative imaging for right knee procedure  EXAM: CHEST  2 VIEW  COMPARISON:  06/03/2014  FINDINGS: Mild cardiac enlargement stable. Stable uncoiling of the aorta. Vascular pattern normal. Elevated right diaphragm stable. Lungs clear. No effusions.  IMPRESSION: No active cardiopulmonary disease.   Electronically Signed   By: Skipper Cliche M.D.   On: 10/19/2014 18:22   Dg Knee Right Port  10/26/2014   CLINICAL DATA:  Postop right total knee replacement  EXAM: PORTABLE RIGHT KNEE - 1-2 VIEW  COMPARISON:  10/16/2013   FINDINGS: Right knee arthroplasty in satisfactory position.  Associated subcutaneous gas and overlying skin staples.  No fracture or dislocation is seen.  IMPRESSION: Right knee arthroplasty in satisfactory position   Electronically Signed   By: Julian Hy M.D.   On: 10/26/2014 18:15    Disposition: 01-Home or Self Care      Discharge Instructions    Call MD / Call 911    Complete by:  As directed   If you experience chest pain or shortness of breath, CALL 911 and be transported to the hospital emergency room.  If you develope a fever above 101 F, pus (white drainage) or increased drainage or redness at the wound, or calf pain, call your surgeon's office.     Constipation Prevention    Complete by:  As directed   Drink plenty of fluids.  Prune juice may be helpful.  You may use a stool softener, such as Colace (over the counter) 100 mg twice a day.  Use MiraLax (over the counter) for constipation as needed.     Diet - low sodium heart healthy    Complete by:  As directed      Discharge instructions    Complete by:  As directed   Ice and elevation for knee swelling. Full weight as tolerated; work on knee motion. You can get your knee dressing wet daily in the shower. Do get an over the counter stool softener to take as needed.     Discharge patient    Complete by:  As directed      Increase activity slowly as tolerated    Complete by:  As directed            Follow-up Information    Follow up with [facility].   Why:  They will contact you to schedule home therapy visits.   Contact information:   [address] [place] [address] [phone]       Follow up with [name], MD In 2 weeks.   Specialty:  Orthopedic Surgery   Contact information:   [place] [address] [phone]        Signed: [name] 10/29/2014, 7:00 AM

## 2014-10-29 NOTE — Progress Notes (Signed)
Subjective: 3 Days Post-Op Procedure(s) (LRB): RIGHT TOTAL KNEE ARTHROPLASTY (Right) Patient reports pain as moderate.    Objective: Vital signs in last 24 hours: Temp:  [97.9 F (36.6 C)-98.4 F (36.9 C)] 98.4 F (36.9 C) (03/11 0600) Pulse Rate:  [75-90] 75 (03/11 0600) Resp:  [14-18] 14 (03/11 0600) BP: (95-130)/(44-61) 130/54 mmHg (03/11 0600) SpO2:  [91 %-95 %] 95 % (03/11 0600)  Intake/Output from previous day: 03/10 0701 - 03/11 0700 In: 960 [P.O.:960] Out: -  Intake/Output this shift: Total I/O In: 240 [P.O.:240] Out: -    Recent Labs  10/27/14 1201 10/28/14 0737  HGB 11.0* 9.7*    Recent Labs  10/27/14 1201 10/28/14 0737  WBC 9.1 15.5*  RBC 3.57* 3.08*  HCT 32.9* 27.3*  PLT PLATELET CLUMPS NOTED ON SMEAR, UNABLE TO ESTIMATE 121*    Recent Labs  10/27/14 1201  NA 135  K 3.8  CL 99  CO2 26  BUN 17  CREATININE 0.92  GLUCOSE 173*  CALCIUM 8.8   No results for input(s): LABPT, INR in the last 72 hours.  Sensation intact distally Intact pulses distally Dorsiflexion/Plantar flexion intact Incision: no drainage No cellulitis present Compartment soft  Assessment/Plan: 3 Days Post-Op Procedure(s) (LRB): RIGHT TOTAL KNEE ARTHROPLASTY (Right) Discharge home with home health today.  Raneshia Derick Y 10/29/2014, 6:58 AM

## 2014-10-30 DIAGNOSIS — M1711 Unilateral primary osteoarthritis, right knee: Secondary | ICD-10-CM | POA: Diagnosis not present

## 2014-10-30 MED ORDER — PNEUMOCOCCAL VAC POLYVALENT 25 MCG/0.5ML IJ INJ: 0.5000 mL | INJECTION | INTRAMUSCULAR | Status: DC

## 2014-10-30 MED ORDER — INFLUENZA VAC SPLIT QUAD 0.5 ML IM SUSY
0.5000 mL | PREFILLED_SYRINGE | INTRAMUSCULAR | Status: AC | PRN
  Administered 2014-10-30: 0.5 mL via INTRAMUSCULAR
  Filled 2014-10-30: qty 0.5

## 2014-10-30 MED ORDER — INFLUENZA VAC SPLIT QUAD 0.5 ML IM SUSY: 0.5000 mL | PREFILLED_SYRINGE | INTRAMUSCULAR | Status: DC

## 2014-10-30 MED ORDER — PNEUMOCOCCAL VAC POLYVALENT 25 MCG/0.5ML IJ INJ
0.5000 mL | INJECTION | INTRAMUSCULAR | Status: AC | PRN
  Administered 2014-10-30: 0.5 mL via INTRAMUSCULAR

## 2014-10-30 NOTE — Discharge Summary (Signed)
Patient's discharge held yesterday due to pain. Patient stable for discharge to home today.

## 2014-10-30 NOTE — Clinical Social Work Note (Signed)
CSW received consult for SNF placement. CSW reviewed chart and PT recommendation for [facility] PT, and OT recommendation for no OT follow-up. Patient is to discharge home today. No further needs. CSW signing off.  [place], [place] Weekend Clinical Social Worker [phone]

## 2014-10-30 NOTE — Progress Notes (Signed)
Physical Therapy Treatment Patient Details Name: Felicia Carlson MRN: [id number] DOB: [birth year] Today's Date: 10/30/2014    History of Present Illness Pt admitted 3/7 for elective R TKA. Pt with h/o of CKD and breast ca    PT Comments    Patient seen for final treatment with daughter present, patient and caregiver education focus for exercises, transfer assistance, device management, overall safety and anticipated progression of mobility independence.  No questions remain from patient or patient's daughter at end of session.  Continue with Home Health therapy services, patient actively discharging from hospital.   Follow Up Recommendations  Home health PT;Supervision/Assistance - 24 hour     Equipment Recommendations  3in1 (PT)    Recommendations for Other Services Rehab consult     Precautions / Restrictions Precautions Precautions: Fall Required Braces or Orthoses: Knee Immobilizer - Right Knee Immobilizer - Right: On when out of bed or walking Restrictions Weight Bearing Restrictions: Yes RLE Weight Bearing: Weight bearing as tolerated    Mobility  Bed Mobility Overal bed mobility: Needs Assistance Bed Mobility: Supine to Sit     Supine to sit: HOB elevated;Min assist     General bed mobility comments: LE management  Transfers Overall transfer level: Needs assistance Equipment used: Rolling walker (2 wheeled) Transfers: Sit to/from Stand Sit to Stand: Min guard;Min assist         General transfer comment: Verbal cues for hand placement.  Assist to power up to standing.  Cues to stand upright.  Ambulation/Gait Ambulation/Gait assistance: Supervision Ambulation Distance (Feet): 60 Feet Assistive device: Rolling walker (2 wheeled) Gait Pattern/deviations: Antalgic Gait velocity: slow Gait velocity interpretation: Below normal speed for age/gender General Gait Details: Verbal cues to stand upright and for proper use of RW.  Patient cues for flexed posture,  step to pattern due to Knee Immobilizer.   Stairs            Wheelchair Mobility    Modified Rankin (Stroke Patients Only)       Balance     Sitting balance-Leahy Scale: Fair       Standing balance-Leahy Scale: Fair Standing balance comment: RW                    Cognition Arousal/Alertness: Awake/alert Behavior During Therapy: WFL for tasks assessed/performed           Following Commands: Follows one step commands with increased time Safety/Judgement: Decreased awareness of safety          Exercises Total Joint Exercises Quad Sets: 10 reps;AROM;Seated Heel Slides: AAROM;10 reps;Supine Straight Leg Raises: AAROM;Right;10 reps;Supine    General Comments        Pertinent Vitals/Pain Pain Assessment: 0-10 Pain Score: 3  Pain Location: Rt knee Pain Descriptors / Indicators: Aching;Sore Pain Intervention(s): Monitored during session    Home Living Family/patient expects to be discharged to:: Private residence Living Arrangements: Children (going to stay with daughter) Available Help at Discharge: Family;Available 24 hours/day Type of Home: House Home Access: Level entry   Home Layout: One level Home Equipment: Walker - 4 wheels;Cane - single point Additional Comments: pt has a rollator; not a RW    Prior Function Level of Independence: Independent with assistive device(s)      Comments: used cane PTA; pt was a CNA for 40 years   PT Goals (current goals can now be found in the care plan section) Acute Rehab PT Goals Patient Stated Goal: home PT Goal Formulation: With patient Time For  Goal Achievement: 11/03/14 Potential to Achieve Goals: Good    Frequency  7X/week    PT Plan Current plan remains appropriate    Co-evaluation             End of Session Equipment Utilized During Treatment: Gait belt;Right knee immobilizer Activity Tolerance: Patient tolerated treatment well Patient left: in chair;with call bell/phone  within reach     Time: 1435-1455 PT Time Calculation (min) (ACUTE ONLY): 20 min  Charges:  $Gait Training: 8-22 mins $Therapeutic Exercise: 8-22 mins $Therapeutic Activity: 8-22 mins                    G Codes:      Felicia Carlson 11/29/2014, 4:23 PM

## 2014-10-30 NOTE — Progress Notes (Signed)
Subjective: 4 Days Post-Op Procedure(s) (LRB): RIGHT TOTAL KNEE ARTHROPLASTY (Right) Patient reports pain as moderate.  Reports severe knee pain yesterday when up to bathroom. Up today to bathroom doing well. Denies chest pain, SOB or dizziness.  Objective: Vital signs in last 24 hours: Temp:  [98.2 F (36.8 C)-99 F (37.2 C)] 98.2 F (36.8 C) (03/12 0530) Pulse Rate:  [71-80] 76 (03/12 0530) Resp:  [14-16] 16 (03/12 0800) BP: (123-167)/(52-64) 167/64 mmHg (03/12 0530) SpO2:  [91 %-95 %] 95 % (03/12 0530)  Intake/Output from previous day:   Intake/Output this shift:     Recent Labs  10/27/14 1201 10/28/14 0737  HGB 11.0* 9.7*    Recent Labs  10/27/14 1201 10/28/14 0737  WBC 9.1 15.5*  RBC 3.57* 3.08*  HCT 32.9* 27.3*  PLT PLATELET CLUMPS NOTED ON SMEAR, UNABLE TO ESTIMATE 121*    Recent Labs  10/27/14 1201  NA 135  K 3.8  CL 99  CO2 26  BUN 17  CREATININE 0.92  GLUCOSE 173*  CALCIUM 8.8   No results for input(s): LABPT, INR in the last 72 hours.  Sensation intact distally Intact pulses distally Dorsiflexion/Plantar flexion intact Incision: dressing C/D/I Compartment soft  Assessment/Plan: 4 Days Post-Op Procedure(s) (LRB): RIGHT TOTAL KNEE ARTHROPLASTY (Right) Discharge home with home health  Erskine Emery 10/30/2014, 9:12 AM

## 2014-10-30 NOTE — Progress Notes (Signed)
Physical Therapy Treatment Patient Details Name: Felicia Carlson MRN: [id number] DOB: 02-28-1925 Today's Date: 10/30/2014    History of Present Illness Pt admitted 3/7 for elective R TKA. Pt with h/o of CKD and breast ca    PT Comments    Patient meal just arrived, agreeable to getting up to chair for lunch.  Patient cognition and lethargy improved today from yesterday's note.  Patient MIN assist overall for mobility, still requires Knee Immobilizer due to quad weakness.  Emphasis on importance of self exercises and end range ROM, especially extension.  Patient remains appropriate to continue skilled PT services.   Follow Up Recommendations  Home health PT;Supervision/Assistance - 24 hour     Equipment Recommendations  3in1 (PT)    Recommendations for Other Services Rehab consult     Precautions / Restrictions Precautions Precautions: Fall Required Braces or Orthoses: Knee Immobilizer - Right Knee Immobilizer - Right: On when out of bed or walking Restrictions Weight Bearing Restrictions: Yes RLE Weight Bearing: Weight bearing as tolerated    Mobility  Bed Mobility Overal bed mobility: Needs Assistance Bed Mobility: Supine to Sit     Supine to sit: HOB elevated;Min assist     General bed mobility comments: LE management  Transfers Overall transfer level: Needs assistance Equipment used: Rolling walker (2 wheeled) Transfers: Sit to/from Stand Sit to Stand: Min guard         General transfer comment: Verbal cues for hand placement.  Assist to power up to standing.  Cues to stand upright.  Ambulation/Gait Ambulation/Gait assistance: Min guard;Supervision Ambulation Distance (Feet): 70 Feet Assistive device: Rolling walker (2 wheeled)   Gait velocity: slow   General Gait Details: Verbal cues to stand upright and for proper use of RW.  Patient cues for flexed posture, step to pattern due to Knee Immobilizer.   Stairs            Wheelchair Mobility     Modified Rankin (Stroke Patients Only)       Balance     Sitting balance-Leahy Scale: Fair       Standing balance-Leahy Scale: Fair Standing balance comment: RW                    Cognition Arousal/Alertness: Awake/alert Behavior During Therapy: WFL for tasks assessed/performed           Following Commands: Follows one step commands with increased time Safety/Judgement: Decreased awareness of safety          Exercises Total Joint Exercises Quad Sets: 10 reps;AROM;Seated Heel Slides: AAROM;10 reps;Supine Straight Leg Raises: AAROM;Right;10 reps;Supine    General Comments        Pertinent Vitals/Pain Pain Assessment: 0-10 Pain Score: 4  Pain Location: Rt knee Pain Descriptors / Indicators: Aching;Sore Pain Intervention(s): Monitored during session    Home Living                      Prior Function            PT Goals (current goals can now be found in the care plan section) Acute Rehab PT Goals Patient Stated Goal: home PT Goal Formulation: With patient Time For Goal Achievement: 11/03/14 Potential to Achieve Goals: Good    Frequency  7X/week    PT Plan Current plan remains appropriate    Co-evaluation             End of Session Equipment Utilized During Treatment: Gait belt;Right knee immobilizer Activity  Tolerance: Patient tolerated treatment well Patient left: in chair;with call bell/phone within reach     Time: 1230-1305 PT Time Calculation (min) (ACUTE ONLY): 35 min  Charges:  $Gait Training: 8-22 mins $Therapeutic Exercise: 8-22 mins                    G Codes:      Felicia Carlson L 22-Nov-2014, 1:49 PM

## 2014-11-22 ENCOUNTER — Ambulatory Visit: Payer: Medicare HMO | Attending: Orthopaedic Surgery | Admitting: Physical Therapy

## 2014-11-22 ENCOUNTER — Encounter: Payer: Self-pay | Admitting: Physical Therapy

## 2014-11-22 DIAGNOSIS — M25561 Pain in right knee: Secondary | ICD-10-CM | POA: Insufficient documentation

## 2014-11-22 DIAGNOSIS — M25661 Stiffness of right knee, not elsewhere classified: Secondary | ICD-10-CM | POA: Insufficient documentation

## 2014-11-22 DIAGNOSIS — R262 Difficulty in walking, not elsewhere classified: Secondary | ICD-10-CM | POA: Diagnosis not present

## 2014-11-22 DIAGNOSIS — M5441 Lumbago with sciatica, right side: Secondary | ICD-10-CM | POA: Insufficient documentation

## 2014-11-22 NOTE — Therapy (Signed)
[place] [place], [place], [address] Phone: [phone]   Fax:  [phone]  Physical Therapy Evaluation  Patient Details  Name: Felicia Carlson MRN: [id number] Date of Birth: 28-Dec-1924 Referring Provider:  [name]*  Encounter Date: 11/22/2014      PT End of Session - 11/22/14 1547    Visit Number 1   Date for PT Re-Evaluation 01/22/15   PT Start Time 1518   PT Stop Time 1622   PT Time Calculation (min) 64 min   Behavior During Therapy [facility] for tasks assessed/performed      Past Medical History  Diagnosis Date  . Chest pain   . Dyspnea   . Hypertension   . Chronic kidney disease     stent in kidney right side  . Cancer     breast cancer right side  . GERD (gastroesophageal reflux disease)     sometimes  . Arthritis   . Headache     shingles right side of head and right eye    Past Surgical History  Procedure Laterality Date  . Breast surgery Right 2011    lumpectomy  . Tonsillectomy      as a child  . Eye surgery Bilateral 2015    cataracts  . Abdominal hysterectomy    . Appendectomy    . Total knee arthroplasty Right 10/26/2014    Procedure: RIGHT TOTAL KNEE ARTHROPLASTY;  Surgeon: [name], MD;  Location: [facility];  Service: Orthopedics;  Laterality: Right;    There were no vitals filed for this visit.  Visit Diagnosis:  Stiffness of right knee - Plan: PT plan of care cert/re-cert  Right knee pain - Plan: PT plan of care cert/re-cert  Difficulty walking - Plan: PT plan of care cert/re-cert  Right-sided low back pain with right-sided sciatica - Plan: PT plan of care cert/re-cert      Subjective Assessment - 11/22/14 1526    Subjective S/P right TKR 10/26/14, she reports that she had back pain prior to surgery and was getting injections in this area, and that she was in significant back pain prior to surgery   Patient is accompained by: Family member   Limitations Lifting;Standing;Walking;House hold activities   How long can you sit comfortably? 30   How long can you stand comfortably? 5   How long can you walk comfortably? 3    Patient Stated Goals walk and do housework without difficulty   Currently in Pain? Yes   Pain Score 7    Pain Location Knee  c/o pain in the right knee and right low back and buttock   Pain Descriptors / Indicators Aching;Spasm   Pain Type Surgical pain;Chronic pain   Pain Radiating Towards right buttock and leg   Pain Onset More than a month ago   Pain Frequency Constant   Aggravating Factors  standing, walking, trying to rest   Pain Relieving Factors heat   Effect of Pain on Daily Activities limits all ADL's            [facility] PT Assessment - 11/22/14 0001    Assessment   Medical Diagnosis s/p right TKR, back pain   Onset Date 10/26/14   Prior Therapy Home health   Precautions   Precautions None   Restrictions   Weight Bearing Restrictions No   Balance Screen   Has the patient fallen in the past 6 months No   Has the  patient had a decrease in activity level because of a fear of falling?  No   Is the patient reluctant to leave their home because of a fear of falling?  No   Home Environment   Living Enviornment Private residence   Living Arrangements Alone   Additional Comments some cleaning   Prior Function   Level of Independence Independent with basic ADLs;Independent with homemaking with ambulation   Leisure garden   AROM   Overall AROM Comments AROM in chair 27-93 degrees flexion, PROM 7-100 degrees flexion   Strength   Overall Strength Comments right knee 4-/5   Palpation   Palpation warm to touch, tender laterally and distal scar   Special Tests    Special Tests --  quad lag   Standardized Balance Assessment   Standardized Balance Assessment Timed Up and Go Test   Timed Up and Go Test   Normal TUG (seconds) 36   Functional Gait  Assessment   Gait assessed  Yes   Gait Level  Surface Walks 20 ft, slow speed, abnormal gait pattern, evidence for imbalance or deviates 10-15 in outside of the 12 in walkway width. Requires more than 7 sec to ambulate 20 ft.                   [facility] Adult PT Treatment/Exercise - 11/22/14 0001    Ambulation/Gait   Gait Comments gait was iwth a SPC, used wrong hand, slow, significant antalgic gait, but better with her using in the correct hand   Knee/Hip Exercises: Aerobic   Isokinetic NuStep Level 3 x 5 minutes   Moist Heat Therapy   Number Minutes Moist Heat 15 Minutes   Moist Heat Location --  low back   Cryotherapy   Number Minutes Cryotherapy 15 Minutes   Cryotherapy Location Knee   Type of Cryotherapy Ice pack   Electrical Stimulation   Electrical Stimulation Location right low back   Electrical Stimulation Parameters IFC   Electrical Stimulation Goals Pain                PT Education - 11/22/14 1546    Education provided Yes   Education Details Low load long duration stretches   Person(s) Educated Patient   Methods Explanation;Demonstration;Handout   Comprehension Verbalized understanding          PT Short Term Goals - 11/22/14 1554    PT SHORT TERM GOAL #1   Title independent with HEP   Time 2   Period Weeks   Status New           PT Long Term Goals - 11/22/14 1554    PT LONG TERM GOAL #1   Title decrease pain 50%   Time 8   Period Weeks   Status New   PT LONG TERM GOAL #2   Title walk with SPC or less device 500 feet with minimal deviation   Time 8   Period Weeks   Status New   PT LONG TERM GOAL #3   Title increase right knee AROM to 5-110 degrees flexion   Time 8   Period Weeks   Status New   PT LONG TERM GOAL #4   Title increase knee strength to 4+/5   Time 8   Period Weeks   Status New               Plan - 11/22/14 1553    Clinical Impression Statement Patient with stiff knee and poor gait after  right TKR on 10/26/14, AROM was 17-93 degrees flexion, she also  has significant low back pain   Pt will benefit from skilled therapeutic intervention in order to improve on the following deficits Abnormal gait;Decreased activity tolerance;Decreased endurance;Decreased balance;Decreased mobility;Increased edema;Decreased strength;Difficulty walking;Impaired flexibility;Pain   Rehab Potential Good   PT Frequency 2x / week   PT Duration 8 weeks   PT Treatment/Interventions Electrical Stimulation;Moist Heat;Cryotherapy;Ultrasound;Gait training;Stair training;Functional mobility training;Therapeutic activities;Neuromuscular re-education;Balance training;Therapeutic exercise;Patient/family education;Manual techniques   PT Next Visit Plan add exercises   Consulted and Agree with Plan of Care Patient          G-Codes - 2014/11/24 1557    Functional Assessment Tool Used FOTO   Functional Limitation Mobility: Walking and moving around   Mobility: Walking and Moving Around Current Status [id number]) At least 60 percent but less than 80 percent impaired, limited or restricted   Mobility: Walking and Moving Around Goal Status [phone]) At least 40 percent but less than 60 percent impaired, limited or restricted       Problem List Patient Active Problem List   Diagnosis Date Noted  . Osteoarthritis of right knee 10/26/2014  . Status post total right knee replacement 10/26/2014    Sumner Boast, PT 2014/11/24, 4:02 PM  [place] [address] [place], [place], [address] Phone: [phone]   Fax:  [phone]

## 2014-11-26 ENCOUNTER — Encounter: Payer: Self-pay | Admitting: Physical Therapy

## 2014-11-26 ENCOUNTER — Ambulatory Visit: Payer: Medicare HMO | Admitting: Physical Therapy

## 2014-11-26 DIAGNOSIS — M25561 Pain in right knee: Secondary | ICD-10-CM

## 2014-11-26 DIAGNOSIS — M25661 Stiffness of right knee, not elsewhere classified: Secondary | ICD-10-CM

## 2014-11-26 DIAGNOSIS — M5441 Lumbago with sciatica, right side: Secondary | ICD-10-CM

## 2014-11-26 DIAGNOSIS — R262 Difficulty in walking, not elsewhere classified: Secondary | ICD-10-CM

## 2014-11-26 NOTE — Therapy (Signed)
[address] [place], [place], [address] Phone: [phone]   Fax:  [phone]  Physical Therapy Treatment  Patient Details  Name: Felicia Carlson MRN: [id number] Date of Birth: 1925/06/18 Referring Provider:  [name]*  Encounter Date: 11/26/2014    Past Medical History  Diagnosis Date  . Chest pain   . Dyspnea   . Hypertension   . Chronic kidney disease     stent in kidney right side  . Cancer     breast cancer right side  . GERD (gastroesophageal reflux disease)     sometimes  . Arthritis   . Headache     shingles right side of head and right eye    Past Surgical History  Procedure Laterality Date  . Breast surgery Right 2011    lumpectomy  . Tonsillectomy      as a child  . Eye surgery Bilateral 2015    cataracts  . Abdominal hysterectomy    . Appendectomy    . Total knee arthroplasty Right 10/26/2014    Procedure: RIGHT TOTAL KNEE ARTHROPLASTY;  Surgeon: [name], MD;  Location: [facility];  Service: Orthopedics;  Laterality: Right;    There were no vitals filed for this visit.  Visit Diagnosis:  Stiffness of right knee  Right knee pain  Difficulty walking  Right-sided low back pain with right-sided sciatica      Subjective Assessment - 11/26/14 1018    Subjective Oh, Lord.   Currently in Pain? Yes   Pain Score 9    Pain Location Knee   Pain Orientation Right                       [facility] Adult PT Treatment/Exercise - 11/26/14 0001    Exercises   Exercises Knee/Hip   Knee/Hip Exercises: Aerobic   Stationary Bike 6 minutes   Isokinetic Nustep level 5 x 6 minutes   Knee/Hip Exercises: Standing   Other Standing Knee Exercises standing hamstring curls 2x10   Knee/Hip Exercises: Seated   Long Arc Quad 2 sets;10 reps   Long Arc Quad Weight 3 lbs.   Other Seated Knee Exercises 3# hip flex 2x10   Modalities   Modalities Cryotherapy;Moist  Heat;Electrical Stimulation   Moist Heat Therapy   Number Minutes Moist Heat 15 Minutes   Moist Heat Location Other (comment)  lumbar   Cryotherapy   Number Minutes Cryotherapy 15 Minutes   Cryotherapy Location Knee   Type of Cryotherapy Ice pack   Electrical Stimulation   Electrical Stimulation Location right knee   Electrical Stimulation Parameters IFC   Electrical Stimulation Goals Pain                PT Education - 11/26/14 1112    Education provided Yes   Education Details Educated on RICE, hamstring stretch, quad sets.   Person(s) Educated Patient   Methods Explanation;Demonstration   Comprehension Verbalized understanding;Returned demonstration          PT Short Term Goals - 11/22/14 1554    PT SHORT TERM GOAL #1   Title independent with HEP   Time 2   Period Weeks   Status New           PT Long Term Goals - 11/22/14 1554    PT LONG TERM GOAL #1   Title decrease pain 50%   Time 8   Period Weeks   Status  New   PT LONG TERM GOAL #2   Title walk with SPC or less device 500 feet with minimal deviation   Time 8   Period Weeks   Status New   PT LONG TERM GOAL #3   Title increase right knee AROM to 5-110 degrees flexion   Time 8   Period Weeks   Status New   PT LONG TERM GOAL #4   Title increase knee strength to 4+/5   Time 8   Period Weeks   Status New               Problem List Patient Active Problem List   Diagnosis Date Noted  . Osteoarthritis of right knee 10/26/2014  . Status post total right knee replacement 10/26/2014    Felicia Carlson PTA 11/26/2014, 11:13 AM  [address] [address] [place], [place], [address] Phone: [phone]   Fax:  [phone]

## 2014-11-30 ENCOUNTER — Encounter: Payer: Self-pay | Admitting: Physical Therapy

## 2014-11-30 ENCOUNTER — Ambulatory Visit: Payer: Medicare HMO | Admitting: Physical Therapy

## 2014-11-30 DIAGNOSIS — M25661 Stiffness of right knee, not elsewhere classified: Secondary | ICD-10-CM | POA: Diagnosis not present

## 2014-11-30 DIAGNOSIS — M25561 Pain in right knee: Secondary | ICD-10-CM

## 2014-11-30 NOTE — Therapy (Signed)
[address] [place], [place], [address] Phone: [phone]   Fax:  [phone]  Physical Therapy Treatment  Patient Details  Name: Felicia Carlson MRN: [id number] Date of Birth: June 09, 1925 Referring Provider:  [name]*  Encounter Date: 11/30/2014      PT End of Session - 11/30/14 1453    Visit Number 3   PT Start Time 1400   PT Stop Time 1510   PT Time Calculation (min) 70 min      Past Medical History  Diagnosis Date  . Chest pain   . Dyspnea   . Hypertension   . Chronic kidney disease     stent in kidney right side  . Cancer     breast cancer right side  . GERD (gastroesophageal reflux disease)     sometimes  . Arthritis   . Headache     shingles right side of head and right eye    Past Surgical History  Procedure Laterality Date  . Breast surgery Right 2011    lumpectomy  . Tonsillectomy      as a child  . Eye surgery Bilateral 2015    cataracts  . Abdominal hysterectomy    . Appendectomy    . Total knee arthroplasty Right 10/26/2014    Procedure: RIGHT TOTAL KNEE ARTHROPLASTY;  Surgeon: [name], MD;  Location: [facility];  Service: Orthopedics;  Laterality: Right;    There were no vitals filed for this visit.  Visit Diagnosis:  Right knee pain      Subjective Assessment - 11/30/14 1403    Subjective back is biggest problem, knee is stiff- workin on walking   Pain Score 7    Pain Location Knee   Pain Orientation Right            [facility] PT Assessment - 11/30/14 0001    AROM   Overall AROM Comments 4-111                   [facility] Adult PT Treatment/Exercise - 11/30/14 0801    Ambulation/Gait   Gait Comments gait working on knee flexion in swing phase on RT, VCing needed   Knee/Hip Exercises: Aerobic   Stationary Bike 6 minutes   Isokinetic Nustep level 5 x 6 minutes   Knee/Hip Exercises: Machines for Strengthening   Cybex Knee  Extension 5# 2 sets 10   Cybex Knee Flexion 10# 3 sets 10   Knee/Hip Exercises: Standing   Other Standing Knee Exercises standing on airex red tband hip flex,ext and abd   Modalities   Modalities Cryotherapy;Moist Heat;Electrical Stimulation   Moist Heat Therapy   Number Minutes Moist Heat 15 Minutes   Moist Heat Location --  lumbar   Cryotherapy   Number Minutes Cryotherapy 15 Minutes   Cryotherapy Location Knee   Type of Cryotherapy Ice pack   Electrical Stimulation   Electrical Stimulation Location rt knee   Electrical Stimulation Parameters IFC   Electrical Stimulation Goals Pain                  PT Short Term Goals - 11/30/14 1455    PT SHORT TERM GOAL #1   Title independent with HEP   Status Achieved           PT Long Term Goals - 11/30/14 1456    PT LONG TERM GOAL #3   Title increase right knee AROM to 5-110 degrees  flexion   Status On-going   PT LONG TERM GOAL #4   Title increase knee strength to 4+/5   Status On-going               Plan - 11/30/14 1453    Clinical Impression Statement pt with improved ROM and with VCing can improve knee flexion in swing phase of gait   PT Next Visit Plan try gait without RW, continue with strength        Problem List Patient Active Problem List   Diagnosis Date Noted  . Osteoarthritis of right knee 10/26/2014  . Status post total right knee replacement 10/26/2014    PAYSEUR,ANGIE PTA 11/30/2014, 2:58 PM  [place] [address] [place], [place], [address] Phone: [phone]   Fax:  [phone]

## 2014-12-02 ENCOUNTER — Encounter: Payer: Self-pay | Admitting: Physical Therapy

## 2014-12-02 ENCOUNTER — Ambulatory Visit: Payer: Medicare HMO | Admitting: Physical Therapy

## 2014-12-02 DIAGNOSIS — M25661 Stiffness of right knee, not elsewhere classified: Secondary | ICD-10-CM | POA: Diagnosis not present

## 2014-12-02 DIAGNOSIS — M25561 Pain in right knee: Secondary | ICD-10-CM

## 2014-12-02 NOTE — Therapy (Signed)
[address] [place], [place], [address] Phone: [phone]   Fax:  [phone]  Physical Therapy Treatment  Patient Details  Name: Felicia Carlson MRN: [id number] Date of Birth: October 11, 1924 Referring Provider:  [name]*  Encounter Date: 12/02/2014      PT End of Session - 12/02/14 1440    Visit Number 4   PT Start Time 8250   PT Stop Time 1500   PT Time Calculation (min) 65 min      Past Medical History  Diagnosis Date  . Chest pain   . Dyspnea   . Hypertension   . Chronic kidney disease     stent in kidney right side  . Cancer     breast cancer right side  . GERD (gastroesophageal reflux disease)     sometimes  . Arthritis   . Headache     shingles right side of head and right eye    Past Surgical History  Procedure Laterality Date  . Breast surgery Right 2011    lumpectomy  . Tonsillectomy      as a child  . Eye surgery Bilateral 2015    cataracts  . Abdominal hysterectomy    . Appendectomy    . Total knee arthroplasty Right 10/26/2014    Procedure: RIGHT TOTAL KNEE ARTHROPLASTY;  Surgeon: [name], MD;  Location: [facility];  Service: Orthopedics;  Laterality: Right;    There were no vitals filed for this visit.  Visit Diagnosis:  Right knee pain      Subjective Assessment - 12/02/14 1414    Subjective hurting alot last night, turned wrong in bed   Currently in Pain? Yes   Pain Score 5    Pain Location Knee   Pain Orientation Right   Multiple Pain Sites Yes   Pain Score 5   Pain Location Back                       [facility] Adult PT Treatment/Exercise - 12/02/14 0001    Ambulation/Gait   Gait Comments gait with SPC 600 feet outside. Good knee flexion with swing with VCing, slow gait and slight fwd flexed trunk.  Supervision   Knee/Hip Exercises: Aerobic   Stationary Bike 6 minutes   Knee/Hip Exercises: Machines for Strengthening    Cybex Knee Extension 5# 2 sets 10   Cybex Knee Flexion 10# 3 sets 10   Knee/Hip Exercises: Standing   Lateral Step Up Right;1 set;15 reps  6 inch   Forward Step Up Right;1 set;15 reps  6 inch   Other Standing Knee Exercises standing on airex red tband hip flex,ext and abd   Moist Heat Therapy   Number Minutes Moist Heat 15 Minutes   Moist Heat Location --  lumbar   Cryotherapy   Number Minutes Cryotherapy 15 Minutes   Cryotherapy Location Knee   Type of Cryotherapy Ice pack   Electrical Stimulation   Electrical Stimulation Location rt knee   Electrical Stimulation Parameters IFC   Electrical Stimulation Goals Pain                PT Education - 12/02/14 1436    Education provided Yes   Education Details okayed to start using SPC at home   Person(s) Educated Patient   Methods Explanation;Demonstration   Comprehension Verbalized understanding;Returned demonstration          PT Short Term Goals -  11/30/14 1455    PT SHORT TERM GOAL #1   Title independent with HEP   Status Achieved           PT Long Term Goals - 12/02/14 1441    PT LONG TERM GOAL #1   Title decrease pain 50%   Status On-going   PT LONG TERM GOAL #2   Title walk with SPC or less device 500 feet with minimal deviation   Status Achieved   PT LONG TERM GOAL #3   Title increase right knee AROM to 5-110 degrees flexion   Status On-going   PT LONG TERM GOAL #4   Title increase knee strength to 4+/5   Status On-going               Plan - 12/02/14 1440    Clinical Impression Statement pt able ot safely demo gait with SPC, some VCing for upright trunkand knee flexion in swing phase. focusing session on gait and strength. pt struggling with back issues/pain   PT Next Visit Plan continue with strength        Problem List Patient Active Problem List   Diagnosis Date Noted  . Osteoarthritis of right knee 10/26/2014  . Status post total right knee replacement 10/26/2014     PAYSEUR,ANGIE PTA 12/02/2014, 2:43 PM  [place] [address] [place], [place], [address] Phone: [phone]   Fax:  [phone]

## 2014-12-07 ENCOUNTER — Ambulatory Visit: Payer: Medicare HMO | Admitting: Physical Therapy

## 2014-12-07 ENCOUNTER — Encounter: Payer: Self-pay | Admitting: Physical Therapy

## 2014-12-07 DIAGNOSIS — M25661 Stiffness of right knee, not elsewhere classified: Secondary | ICD-10-CM | POA: Diagnosis not present

## 2014-12-07 DIAGNOSIS — M25561 Pain in right knee: Secondary | ICD-10-CM

## 2014-12-07 DIAGNOSIS — R262 Difficulty in walking, not elsewhere classified: Secondary | ICD-10-CM

## 2014-12-07 NOTE — Therapy (Signed)
[address] [place], [place], [address] Phone: [phone]   Fax:  [phone]  Physical Therapy Treatment  Patient Details  Name: Felicia Carlson MRN: [id number] Date of Birth: April 08, 1925 Referring Provider:  [name]*  Encounter Date: 12/07/2014      PT End of Session - 12/07/14 1453    Visit Number 5   PT Start Time 1400   PT Stop Time 1510   PT Time Calculation (min) 70 min      Past Medical History  Diagnosis Date  . Chest pain   . Dyspnea   . Hypertension   . Chronic kidney disease     stent in kidney right side  . Cancer     breast cancer right side  . GERD (gastroesophageal reflux disease)     sometimes  . Arthritis   . Headache     shingles right side of head and right eye    Past Surgical History  Procedure Laterality Date  . Breast surgery Right 2011    lumpectomy  . Tonsillectomy      as a child  . Eye surgery Bilateral 2015    cataracts  . Abdominal hysterectomy    . Appendectomy    . Total knee arthroplasty Right 10/26/2014    Procedure: RIGHT TOTAL KNEE ARTHROPLASTY;  Surgeon: [name], MD;  Location: [facility];  Service: Orthopedics;  Laterality: Right;    There were no vitals filed for this visit.  Visit Diagnosis:  Right knee pain  Difficulty walking      Subjective Assessment - 12/07/14 1448    Subjective back and knee hurting alot today, walking more with SPC, steps scare me   Pain Score 9    Pain Location Knee   Pain Orientation Right   Multiple Pain Sites Yes                         [facility] Adult PT Treatment/Exercise - 12/07/14 0001    Ambulation/Gait   Ambulation/Gait --  up and down flight of steps 1 rail mod A step too   Knee/Hip Exercises: Aerobic   Stationary Bike 6 minutes   Isokinetic Nustep level 5 x 6 minutes   Knee/Hip Exercises: Machines for Strengthening   Cybex Knee Extension 5# 2 sets 10   Cybex  Knee Flexion 10# 3 sets 10   Knee/Hip Exercises: Standing   Other Standing Knee Exercises * inch step touch 2.5# RT 2 sets 10   Other Standing Knee Exercises foam roll step over 2.5# fwd and lateral 2 sets 10   Moist Heat Therapy   Number Minutes Moist Heat 15 Minutes   Moist Heat Location --  lumbar   Cryotherapy   Number Minutes Cryotherapy 15 Minutes   Cryotherapy Location Knee   Type of Cryotherapy Ice pack   Electrical Stimulation   Electrical Stimulation Location rt knee   Electrical Stimulation Parameters IFC   Electrical Stimulation Goals Pain                  PT Short Term Goals - 11/30/14 1455    PT SHORT TERM GOAL #1   Title independent with HEP   Status Achieved           PT Long Term Goals - 12/02/14 1441    PT LONG TERM GOAL #1   Title decrease pain 50%   Status On-going  PT LONG TERM GOAL #2   Title walk with SPC or less device 500 feet with minimal deviation   Status Achieved   PT LONG TERM GOAL #3   Title increase right knee AROM to 5-110 degrees flexion   Status On-going   PT LONG TERM GOAL #4   Title increase knee strength to 4+/5   Status On-going               Plan - 12/07/14 1453    Clinical Impression Statement improved knee flexion noted with gait, pt less fearful after practice on steps but needs further work for decreased assistance        Problem List Patient Active Problem List   Diagnosis Date Noted  . Osteoarthritis of right knee 10/26/2014  . Status post total right knee replacement 10/26/2014    PAYSEUR,ANGIE PTA  12/07/2014, 2:56 PM  [facility] [address] [place] [place], [place], [address] Phone: [phone]   Fax:  [phone]

## 2014-12-09 ENCOUNTER — Ambulatory Visit: Payer: Medicare HMO | Admitting: Physical Therapy

## 2014-12-09 ENCOUNTER — Encounter: Payer: Self-pay | Admitting: Physical Therapy

## 2014-12-09 DIAGNOSIS — M25561 Pain in right knee: Secondary | ICD-10-CM

## 2014-12-09 DIAGNOSIS — M25661 Stiffness of right knee, not elsewhere classified: Secondary | ICD-10-CM | POA: Diagnosis not present

## 2014-12-09 NOTE — Therapy (Signed)
[address] [place], [place], [address] Phone: [phone]   Fax:  [phone]  Physical Therapy Treatment  Patient Details  Name: Felicia Carlson MRN: [id number] Date of Birth: [birth year] Referring Provider:  [name]*  Encounter Date: 12/09/2014      PT End of Session - 12/09/14 1442    Visit Number 6   PT Start Time 1401   PT Stop Time 1503   PT Time Calculation (min) 62 min      Past Medical History  Diagnosis Date  . Chest pain   . Dyspnea   . Hypertension   . Chronic kidney disease     stent in kidney right side  . Cancer     breast cancer right side  . GERD (gastroesophageal reflux disease)     sometimes  . Arthritis   . Headache     shingles right side of head and right eye    Past Surgical History  Procedure Laterality Date  . Breast surgery Right 2011    lumpectomy  . Tonsillectomy      as a child  . Eye surgery Bilateral 2015    cataracts  . Abdominal hysterectomy    . Appendectomy    . Total knee arthroplasty Right 10/26/2014    Procedure: RIGHT TOTAL KNEE ARTHROPLASTY;  Surgeon: [name], MD;  Location: [facility];  Service: Orthopedics;  Laterality: Right;    There were no vitals filed for this visit.  Visit Diagnosis:  Right knee pain      Subjective Assessment - 12/09/14 1410    Subjective pt amb in with SPC with good flexion in Rt swing phase, pt continues to c/o high pain levels   Currently in Pain? Yes   Pain Score 9    Pain Location Knee   Pain Orientation Right            [facility] PT Assessment - 12/09/14 0001    AROM   Overall AROM Comments 4-115                     [facility] Adult PT Treatment/Exercise - 12/09/14 0001    Ambulation/Gait   Ambulation/Gait --  up and down steps with HHA min A and 1 rail step too   Knee/Hip Exercises: Aerobic   Stationary Bike 6 minutes   Isokinetic Nustep level 5 x 6 minutes   Knee/Hip Exercises: Standing   Lateral Step Up Right;1 set;15 reps  6 inch   Forward Step Up Right;1 set;15 reps  6 inch   Other Standing Knee Exercises --  6inch   Other Standing Knee Exercises foam roll step over 2.5# fwd and lateral 2 sets 10   Moist Heat Therapy   Number Minutes Moist Heat 15 Minutes   Moist Heat Location --  lumbar   Cryotherapy   Number Minutes Cryotherapy 15 Minutes   Cryotherapy Location Knee   Type of Cryotherapy Ice pack   Electrical Stimulation   Electrical Stimulation Location rt knee   Electrical Stimulation Parameters IFC   Electrical Stimulation Goals Pain                  PT Short Term Goals - 11/30/14 1455    PT SHORT TERM GOAL #1   Title independent with HEP   Status Achieved           PT Long Term Goals - 12/09/14 1443  PT LONG TERM GOAL #1   Title decrease pain 50%   Status On-going   PT LONG TERM GOAL #2   Title walk with SPC or less device 500 feet with minimal deviation   Status Achieved   PT LONG TERM GOAL #3   Title increase right knee AROM to 5-110 degrees flexion   Status Achieved   PT LONG TERM GOAL #4   Title increase knee strength to 4+/5   Status On-going               Plan - 12/09/14 1442    Clinical Impression Statement pt with improved flexion by 4 degrees, pt went from Mod A to min a on steps and is now amb with SPC with good knee flexion in swing phase        Problem List Patient Active Problem List   Diagnosis Date Noted  . Osteoarthritis of right knee 10/26/2014  . Status post total right knee replacement 10/26/2014    Jackie Littlejohn,ANGIE PTA 12/09/2014, 2:44 PM  [facility] [address] [place], [place], [address] Phone: [phone]   Fax:  [phone]

## 2014-12-14 ENCOUNTER — Ambulatory Visit: Payer: Medicare HMO | Admitting: Physical Therapy

## 2014-12-14 ENCOUNTER — Encounter: Payer: Self-pay | Admitting: Physical Therapy

## 2014-12-14 DIAGNOSIS — R262 Difficulty in walking, not elsewhere classified: Secondary | ICD-10-CM

## 2014-12-14 DIAGNOSIS — M25661 Stiffness of right knee, not elsewhere classified: Secondary | ICD-10-CM | POA: Diagnosis not present

## 2014-12-14 NOTE — Therapy (Signed)
[address] [place], [place], [address] Phone: [phone]   Fax:  [phone]  Physical Therapy Treatment  Patient Details  Name: Felicia Carlson MRN: [id number] Date of Birth: 1925/08/07 Referring Provider:  [name]*  Encounter Date: 12/14/2014      PT End of Session - 12/14/14 1446    Visit Number 7   PT Start Time 7106   PT Stop Time 1500   PT Time Calculation (min) 67 min      Past Medical History  Diagnosis Date  . Chest pain   . Dyspnea   . Hypertension   . Chronic kidney disease     stent in kidney right side  . Cancer     breast cancer right side  . GERD (gastroesophageal reflux disease)     sometimes  . Arthritis   . Headache     shingles right side of head and right eye    Past Surgical History  Procedure Laterality Date  . Breast surgery Right 2011    lumpectomy  . Tonsillectomy      as a child  . Eye surgery Bilateral 2015    cataracts  . Abdominal hysterectomy    . Appendectomy    . Total knee arthroplasty Right 10/26/2014    Procedure: RIGHT TOTAL KNEE ARTHROPLASTY;  Surgeon: [name], MD;  Location: [facility];  Service: Orthopedics;  Laterality: Right;    There were no vitals filed for this visit.  Visit Diagnosis:  Difficulty walking      Subjective Assessment - 12/14/14 1402    Subjective pt. amb in with SPC  good flexion of R knee in swing phase.  Pt. states her back is what is causing her a lot of pain.    Currently in Pain? Yes   Pain Score 8    Pain Location Back                         [facility] Adult PT Treatment/Exercise - 12/14/14 0001    Ambulation/Gait   Stairs Yes  VCs needed for correct sequenceing up and down 3 flights    Stairs Assistance 5: Supervision   Gait Comments SPC and handrail   Knee/Hip Exercises: Aerobic   Stationary Bike 6 minutes   Isokinetic Nustep level 5 x 6 minutes   Knee/Hip Exercises:  Plyometrics   Other Plyometric Exercises step- over foam rolls functional walk 15 feet with 2 1/2 # weight with toe touch x5 on 6in and 8in step step with cane assist x 5   Moist Heat Therapy   Number Minutes Moist Heat 15 Minutes   Moist Heat Location --  Lumbar   Cryotherapy   Number Minutes Cryotherapy 15 Minutes   Cryotherapy Location Knee   Type of Cryotherapy Ice pack   Electrical Stimulation   Electrical Stimulation Location Rt low back   Electrical Stimulation Parameters IFC   Electrical Stimulation Goals Pain                  PT Short Term Goals - 11/30/14 1455    PT SHORT TERM GOAL #1   Title independent with HEP   Status Achieved           PT Long Term Goals - 12/09/14 1443    PT LONG TERM GOAL #1   Title decrease pain 50%   Status On-going   PT LONG  TERM GOAL #2   Title walk with SPC or less device 500 feet with minimal deviation   Status Achieved   PT LONG TERM GOAL #3   Title increase right knee AROM to 5-110 degrees flexion   Status Achieved   PT LONG TERM GOAL #4   Title increase knee strength to 4+/5   Status On-going               Plan - 12/14/14 1447    Clinical Impression Statement trial of IFC to LB since this is pt. largest source of pain.  pt. concern of out of pocket expenice and may request D/C next visit.  pt. improved with gait on steps but still requiring VCing.Pt amb now with SPC at all tmes and good knee flexion in swing phae.   PT Next Visit Plan assess goals        Problem List Patient Active Problem List   Diagnosis Date Noted  . Osteoarthritis of right knee 10/26/2014  . Status post total right knee replacement 10/26/2014   Oneal Grout [place], PTA  12/14/2014, 2:52 PM  [address] [place], [place], [address] Phone: [phone]   Fax:  [phone]

## 2014-12-16 ENCOUNTER — Ambulatory Visit: Payer: Medicare HMO | Admitting: Physical Therapy

## 2014-12-16 DIAGNOSIS — R262 Difficulty in walking, not elsewhere classified: Secondary | ICD-10-CM

## 2014-12-16 DIAGNOSIS — M25661 Stiffness of right knee, not elsewhere classified: Secondary | ICD-10-CM | POA: Diagnosis not present

## 2014-12-16 NOTE — Therapy (Signed)
[address] [place], [place], [address] Phone: [phone]   Fax:  [phone]  Physical Therapy Treatment  Patient Details  Name: Felicia Carlson MRN: [id number] Date of Birth: September 19, 1924 Referring Provider:  [name]*  Encounter Date: 12/16/2014      PT End of Session - 12/16/14 1439    Visit Number 8   PT Start Time 1400   PT Stop Time 1500   PT Time Calculation (min) 60 min      Past Medical History  Diagnosis Date  . Chest pain   . Dyspnea   . Hypertension   . Chronic kidney disease     stent in kidney right side  . Cancer     breast cancer right side  . GERD (gastroesophageal reflux disease)     sometimes  . Arthritis   . Headache     shingles right side of head and right eye    Past Surgical History  Procedure Laterality Date  . Breast surgery Right 2011    lumpectomy  . Tonsillectomy      as a child  . Eye surgery Bilateral 2015    cataracts  . Abdominal hysterectomy    . Appendectomy    . Total knee arthroplasty Right 10/26/2014    Procedure: RIGHT TOTAL KNEE ARTHROPLASTY;  Surgeon: [name], MD;  Location: [facility];  Service: Orthopedics;  Laterality: Right;    There were no vitals filed for this visit.  Visit Diagnosis:  Difficulty walking      Subjective Assessment - 12/16/14 1425    Subjective I am really hurting in my back , estim helped some, I think maybe they hit a nerve with epidural   Currently in Pain? Yes   Pain Score 10-Worst pain ever   Pain Location Back                         [facility] Adult PT Treatment/Exercise - 12/16/14 0001    Knee/Hip Exercises: Stretches   Passive Hamstring Stretch --  HS stretch with neural tension   Moist Heat Therapy   Number Minutes Moist Heat 15 Minutes   Moist Heat Location --  Lumbar   Electrical Stimulation   Electrical Stimulation Location Rt low back   Electrical Stimulation  Parameters IFC   Electrical Stimulation Goals Pain   Ultrasound   Ultrasound Location RT LB in SL   Ultrasound Parameters 1 .5 w/cm2 1 mhz   Ultrasound Goals Pain   Manual Therapy   Manual Traction lumbar                  PT Short Term Goals - 11/30/14 1455    PT SHORT TERM GOAL #1   Title independent with HEP   Status Achieved           PT Long Term Goals - 12/09/14 1443    PT LONG TERM GOAL #1   Title decrease pain 50%   Status On-going   PT LONG TERM GOAL #2   Title walk with SPC or less device 500 feet with minimal deviation   Status Achieved   PT LONG TERM GOAL #3   Title increase right knee AROM to 5-110 degrees flexion   Status Achieved   PT LONG TERM GOAL #4   Title increase knee strength to 4+/5   Status On-going  Plan - 12/16/14 1439    Clinical Impression Statement pt with increased LBp with limited mobilty, very tight and guarded on RT LB, minimal relief with modalities. Unable to tolerated any ther ex today.   PT Next Visit Plan MD 5/5 write MD note        Problem List Patient Active Problem List   Diagnosis Date Noted  . Osteoarthritis of right knee 10/26/2014  . Status post total right knee replacement 10/26/2014    PAYSEUR,ANGIE PTA 12/16/2014, 2:45 PM  [place] [address] [place], [place], [address] Phone: [phone]   Fax:  [phone]

## 2014-12-20 ENCOUNTER — Ambulatory Visit: Payer: Medicare HMO | Attending: Orthopaedic Surgery | Admitting: Physical Therapy

## 2014-12-20 DIAGNOSIS — R262 Difficulty in walking, not elsewhere classified: Secondary | ICD-10-CM | POA: Insufficient documentation

## 2014-12-20 DIAGNOSIS — M25561 Pain in right knee: Secondary | ICD-10-CM | POA: Diagnosis not present

## 2014-12-20 DIAGNOSIS — M5441 Lumbago with sciatica, right side: Secondary | ICD-10-CM | POA: Diagnosis not present

## 2014-12-20 DIAGNOSIS — M25661 Stiffness of right knee, not elsewhere classified: Secondary | ICD-10-CM | POA: Diagnosis not present

## 2014-12-20 NOTE — Therapy (Addendum)
[address] [place], [place], [address] Phone: [phone]   Fax:  [phone]  Physical Therapy Treatment  Patient Details  Name: Felicia Carlson MRN: [id number] Date of Birth: April 27, 1925 Referring Provider:  [name]*  Encounter Date: 12/20/2014    Past Medical History  Diagnosis Date  . Chest pain   . Dyspnea   . Hypertension   . Chronic kidney disease     stent in kidney right side  . Cancer     breast cancer right side  . GERD (gastroesophageal reflux disease)     sometimes  . Arthritis   . Headache     shingles right side of head and right eye    Past Surgical History  Procedure Laterality Date  . Breast surgery Right 2011    lumpectomy  . Tonsillectomy      as a child  . Eye surgery Bilateral 2015    cataracts  . Abdominal hysterectomy    . Appendectomy    . Total knee arthroplasty Right 10/26/2014    Procedure: RIGHT TOTAL KNEE ARTHROPLASTY;  Surgeon: [name], MD;  Location: [facility];  Service: Orthopedics;  Laterality: Right;    There were no vitals filed for this visit.  Visit Diagnosis:  Difficulty walking  Right knee pain  Stiffness of right knee  Right-sided low back pain with right-sided sciatica                                 PT Short Term Goals - 11/30/14 1455    PT SHORT TERM GOAL #1   Title independent with HEP   Status Achieved           PT Long Term Goals - 12/28/14 1426    PT LONG TERM GOAL #1   Title decrease pain 50%   Status Achieved   PT LONG TERM GOAL #2   Title walk with SPC or less device 500 feet with minimal deviation   Status Achieved   PT LONG TERM GOAL #3   Title increase right knee AROM to 5-110 degrees flexion   Status Achieved   PT LONG TERM GOAL #4   Title increase knee strength to 4+/5   Status Achieved            PHYSICAL THERAPY DISCHARGE SUMMARY  Visits from Start  of Care: 9   Plan: Patient agrees to discharge.  Patient goals were not met. Patient is being discharged due to not returning since the last visit.  ?????    Lum Babe, PT    Problem List Patient Active Problem List   Diagnosis Date Noted  . Osteoarthritis of right knee 10/26/2014  . Status post total right knee replacement 10/26/2014   Laureen Abrahams, PT, DPT 03/30/2015 9:20 AM  [facility] [address] [place], [place], [address] Phone: [phone]   Fax:  [phone]

## 2014-12-21 ENCOUNTER — Encounter: Payer: Medicare PPO | Admitting: Physical Therapy

## 2014-12-22 ENCOUNTER — Ambulatory Visit: Payer: Medicare HMO | Admitting: Rehabilitation

## 2014-12-28 ENCOUNTER — Ambulatory Visit: Payer: Medicare HMO | Admitting: Physical Therapy

## 2014-12-28 NOTE — Therapy (Signed)
[address] [place], [place], [address] Phone: [phone]   Fax:  [phone]  Physical Therapy Treatment  Patient Details  Name: ANETA HENDERSHOTT MRN: [id number] Date of Birth: 1925-02-28 Referring Provider:  [name]*  Encounter Date: 12/28/2014    Past Medical History  Diagnosis Date  . Chest pain   . Dyspnea   . Hypertension   . Chronic kidney disease     stent in kidney right side  . Cancer     breast cancer right side  . GERD (gastroesophageal reflux disease)     sometimes  . Arthritis   . Headache     shingles right side of head and right eye    Past Surgical History  Procedure Laterality Date  . Breast surgery Right 2011    lumpectomy  . Tonsillectomy      as a child  . Eye surgery Bilateral 2015    cataracts  . Abdominal hysterectomy    . Appendectomy    . Total knee arthroplasty Right 10/26/2014    Procedure: RIGHT TOTAL KNEE ARTHROPLASTY;  Surgeon: [name], MD;  Location: [facility];  Service: Orthopedics;  Laterality: Right;    There were no vitals filed for this visit.  Visit Diagnosis:  Difficulty walking   Pt arrived to PT with continued back pain. Pt states saw knee MD and he is referring to specialist for back.No treatment today d/t back pain. All goals met for knee. AROM 0-120 and MMT 5/5. Pt gait limited d/t back and not knee. Pt amb with SPC but recommend use of RW as needed when back is hurting.                                  PT Short Term Goals - 11/30/14 1455    PT SHORT TERM GOAL #1   Title independent with HEP   Status Achieved           PT Long Term Goals - 12/28/14 1426    PT LONG TERM GOAL #1   Title decrease pain 50%   Status Achieved   PT LONG TERM GOAL #2   Title walk with SPC or less device 500 feet with minimal deviation   Status Achieved   PT LONG TERM GOAL #3   Title increase right knee AROM  to 5-110 degrees flexion   Status Achieved   PT LONG TERM GOAL #4   Title increase knee strength to 4+/5   Status Achieved               Problem List Patient Active Problem List   Diagnosis Date Noted  . Osteoarthritis of right knee 10/26/2014  . Status post total right knee replacement 10/26/2014    PAYSEUR,ANGIE PTA 12/28/2014, 2:28 PM  [place] [address] [address] [place], [place], [address] Phone: [phone]   Fax:  [phone]

## 2014-12-30 ENCOUNTER — Ambulatory Visit: Payer: Medicare HMO | Admitting: Physical Therapy

## 2015-08-16 DIAGNOSIS — M47816 Spondylosis without myelopathy or radiculopathy, lumbar region: Secondary | ICD-10-CM | POA: Insufficient documentation

## 2015-08-16 DIAGNOSIS — E669 Obesity, unspecified: Secondary | ICD-10-CM | POA: Insufficient documentation

## 2015-08-16 DIAGNOSIS — I10 Essential (primary) hypertension: Secondary | ICD-10-CM | POA: Insufficient documentation

## 2016-05-30 ENCOUNTER — Other Ambulatory Visit: Payer: Self-pay | Admitting: Internal Medicine

## 2016-05-30 ENCOUNTER — Other Ambulatory Visit: Payer: Self-pay | Admitting: Oncology

## 2016-05-30 DIAGNOSIS — Z1231 Encounter for screening mammogram for malignant neoplasm of breast: Secondary | ICD-10-CM

## 2016-06-20 ENCOUNTER — Ambulatory Visit: Payer: Medicare PPO

## 2016-07-19 ENCOUNTER — Ambulatory Visit
Admission: RE | Admit: 2016-07-19 | Discharge: 2016-07-19 | Disposition: A | Discharge status: Home / Self Care | Payer: Medicare HMO | Source: Ambulatory Visit | Attending: Oncology | Admitting: Oncology

## 2016-07-19 DIAGNOSIS — Z1231 Encounter for screening mammogram for malignant neoplasm of breast: Secondary | ICD-10-CM

## 2016-09-10 DIAGNOSIS — M533 Sacrococcygeal disorders, not elsewhere classified: Secondary | ICD-10-CM | POA: Diagnosis present

## 2016-10-11 DIAGNOSIS — K5130 Ulcerative (chronic) rectosigmoiditis without complications: Secondary | ICD-10-CM | POA: Insufficient documentation

## 2017-01-07 ENCOUNTER — Ambulatory Visit (INDEPENDENT_AMBULATORY_CARE_PROVIDER_SITE_OTHER): Payer: Medicare HMO

## 2017-01-07 ENCOUNTER — Ambulatory Visit (INDEPENDENT_AMBULATORY_CARE_PROVIDER_SITE_OTHER): Payer: Medicare HMO | Admitting: Physician Assistant

## 2017-01-07 ENCOUNTER — Encounter (INDEPENDENT_AMBULATORY_CARE_PROVIDER_SITE_OTHER): Payer: Self-pay

## 2017-01-07 ENCOUNTER — Encounter (INDEPENDENT_AMBULATORY_CARE_PROVIDER_SITE_OTHER): Payer: Self-pay | Admitting: Physician Assistant

## 2017-01-07 DIAGNOSIS — M542 Cervicalgia: Secondary | ICD-10-CM

## 2017-01-07 DIAGNOSIS — G8929 Other chronic pain: Secondary | ICD-10-CM

## 2017-01-07 DIAGNOSIS — M25511 Pain in right shoulder: Secondary | ICD-10-CM | POA: Diagnosis not present

## 2017-01-07 DIAGNOSIS — M25512 Pain in left shoulder: Secondary | ICD-10-CM

## 2017-01-07 NOTE — Progress Notes (Signed)
Office Visit Note   Patient: Felicia Carlson           Date of Birth: 1924-10-13           MRN: [id number] Visit Date: 01/07/2017              Requested by: Rodena Medin, MD [phone] [place], [place] [address] PCP: Rodena Medin, MD   Assessment & Plan: Visit Diagnoses:  1. Neck pain   2. Chronic left shoulder pain   3. Chronic right shoulder pain     Plan: Offered to set her up for intra-articular injections in shoulder she defers.Would recommend these intra-articular injections bead done under fluoroscopy with Dr. Ernestina Patches. At this point in time she did like to continue to take her over-the-counter anti-inflammatories. She states if his pain becomes bad enough she'll consider having injections in one or both shoulders.  Follow-Up Instructions: Return if symptoms worsen or fail to improve.   Orders:  Orders Placed This Encounter  Procedures  . XR Cervical Spine 2 or 3 views  . XR Shoulder Left  . XR Shoulder Right   No orders of the defined types were placed in this encounter.     Procedures: No procedures performed   Clinical Data: No additional findings.   Subjective: Chief Complaint  Patient presents with  . Neck - Pain  . Left Shoulder - Pain  . Right Shoulder - Pain    HPI Cirrhosis pleasant 83 year old female is well-known Dr. Trevor Mace service with a history of a right total knee some 2 years ago. Comes in today with bilateral shoulder pain having some numbness tingling down the right arm. She noted she has decreased range of motion of both shoulders. She's taken some ibuprofen with some relief. She states her neck creaks whenever she turns it has some trouble turning her head side to side. Pain in both  shoulders down to the from the shoulder to the elbows. She's had no known injury. Currently the right shoulder bothers her more than the left. She states that the pain is been ongoing for the last couple weeks.  Review of Systems No chest pain  shortness breath fevers chills. Positive bilateral knee pain otherwise please see history of present illness  Objective: Vital Signs: There were no vitals taken for this visit.  Physical Exam  Constitutional: She is oriented to person, place, and time. She appears well-developed and well-nourished. No distress.  Pulmonary/Chest: Effort normal.  Neurological: She is alert and oriented to person, place, and time.  Psychiatric: She has a normal mood and affect. Her behavior is normal.    Ortho Exam Bilateral shoulders with decreased forward flexion only to approximately 140 passively. She has limited external rotation of both shoulders. Positive crepitus with range of motion of the right shoulder. 5 out of 5 strengths bilateral shoulders with testing rotator cuff. Cervical spine she has good flexion and extension cervical spine. Good range of motion with the side to side. Radial pulses are tach. Sensation bilateral hands grossly intact. Specialty Comments:  No specialty comments available.  Imaging: Xr Cervical Spine 2 Or 3 Views  Result Date: 01/07/2017 AP lateral views of the cervical spine: No acute fractures. Disc space overall well maintained. No spondylolisthesis. Endplate spurring B9-T9. Normal lordotic curvature maintained  Xr Shoulder Left  Result Date: 01/07/2017 Left shoulder AP, Y view and axillary view: No acute fracture. Humerus well located. Sclerotic changes of the humeral head with severe arthritic changes  of glenohumeral joint. Subacromial space appears well maintained.  Xr Shoulder Right  Result Date: 01/07/2017 3 views right shoulder: No acute fracture. Calcification of the rotator cuff. Severe glenohumeral joint arthritic changes. On the Y view the subacromial space appears well maintained.    PMFS History: Patient Active Problem List   Diagnosis Date Noted  . Osteoarthritis of right knee 10/26/2014  . Status post total right knee replacement 10/26/2014    Past Medical History:  Diagnosis Date  . Arthritis   . Cancer [facility])    breast cancer right side  . Chest pain   . Chronic kidney disease    stent in kidney right side  . Dyspnea   . GERD (gastroesophageal reflux disease)    sometimes  . Headache    shingles right side of head and right eye  . Hypertension     No family history on file.  Past Surgical History:  Procedure Laterality Date  . ABDOMINAL HYSTERECTOMY    . APPENDECTOMY    . BREAST SURGERY Right 2011   lumpectomy  . EYE SURGERY Bilateral 2015   cataracts  . TONSILLECTOMY     as a child  . TOTAL KNEE ARTHROPLASTY Right 10/26/2014   Procedure: RIGHT TOTAL KNEE ARTHROPLASTY;  Surgeon: [name], MD;  Location: [facility];  Service: Orthopedics;  Laterality: Right;   Social History   Occupational History  . Not on file.   Social History Main Topics  . Smoking status: Never Smoker  . Smokeless tobacco: Never Used  . Alcohol use Yes     Comment: wine occasional  . Drug use: No  . Sexual activity: Not on file

## 2017-01-11 ENCOUNTER — Telehealth (INDEPENDENT_AMBULATORY_CARE_PROVIDER_SITE_OTHER): Payer: Self-pay | Admitting: Radiology

## 2017-01-11 NOTE — Telephone Encounter (Signed)
Triage voice mail from [facility] requesting faxed office note from [facility] 01/07/17.  I see that note says that Rodena Medin referred her, and this is not a doctor at that office.  IC and they transferred me to another voice mail???  Not sure we can send the office note, patient sees Dr Deterding at that office.

## 2017-01-15 NOTE — Telephone Encounter (Signed)
FAXED

## 2017-06-19 ENCOUNTER — Other Ambulatory Visit: Payer: Self-pay | Admitting: Oncology

## 2017-06-19 DIAGNOSIS — Z139 Encounter for screening, unspecified: Secondary | ICD-10-CM

## 2017-07-22 ENCOUNTER — Ambulatory Visit
Admission: RE | Admit: 2017-07-22 | Discharge: 2017-07-22 | Disposition: A | Discharge status: Home / Self Care | Payer: Medicare HMO | Source: Ambulatory Visit | Attending: Oncology | Admitting: Oncology

## 2017-07-22 DIAGNOSIS — Z139 Encounter for screening, unspecified: Secondary | ICD-10-CM

## 2017-07-22 HISTORY — DX: Personal history of irradiation: Z92.3

## 2017-07-22 HISTORY — DX: Personal history of antineoplastic chemotherapy: Z92.21

## 2017-09-12 ENCOUNTER — Other Ambulatory Visit: Payer: Self-pay | Admitting: Geriatric Medicine

## 2017-09-12 ENCOUNTER — Ambulatory Visit
Admission: RE | Admit: 2017-09-12 | Discharge: 2017-09-12 | Disposition: A | Discharge status: Home / Self Care | Payer: Medicare HMO | Source: Ambulatory Visit | Attending: Geriatric Medicine | Admitting: Geriatric Medicine

## 2017-09-12 DIAGNOSIS — M545 Low back pain, unspecified: Secondary | ICD-10-CM

## 2017-09-18 ENCOUNTER — Other Ambulatory Visit: Payer: Self-pay | Admitting: Geriatric Medicine

## 2017-09-18 DIAGNOSIS — R102 Pelvic and perineal pain: Secondary | ICD-10-CM

## 2017-09-26 ENCOUNTER — Ambulatory Visit
Admission: RE | Admit: 2017-09-26 | Discharge: 2017-09-26 | Disposition: A | Discharge status: Home / Self Care | Payer: Medicare HMO | Source: Ambulatory Visit | Attending: Geriatric Medicine | Admitting: Geriatric Medicine

## 2017-09-26 DIAGNOSIS — R102 Pelvic and perineal pain: Secondary | ICD-10-CM

## 2017-09-26 MED ORDER — IOPAMIDOL (ISOVUE-300) INJECTION 61%
100.0000 mL | Freq: Once | INTRAVENOUS | Status: AC | PRN
  Administered 2017-09-26: 100 mL via INTRAVENOUS

## 2017-11-13 ENCOUNTER — Other Ambulatory Visit: Payer: Self-pay | Admitting: Geriatric Medicine

## 2017-11-13 DIAGNOSIS — R011 Cardiac murmur, unspecified: Secondary | ICD-10-CM

## 2017-11-18 ENCOUNTER — Other Ambulatory Visit: Payer: Self-pay

## 2017-11-18 ENCOUNTER — Ambulatory Visit (HOSPITAL_COMMUNITY): Payer: Medicare HMO | Attending: Internal Medicine

## 2017-11-18 DIAGNOSIS — I081 Rheumatic disorders of both mitral and tricuspid valves: Secondary | ICD-10-CM | POA: Diagnosis not present

## 2017-11-18 DIAGNOSIS — R011 Cardiac murmur, unspecified: Secondary | ICD-10-CM | POA: Diagnosis present

## 2017-11-18 DIAGNOSIS — I10 Essential (primary) hypertension: Secondary | ICD-10-CM | POA: Insufficient documentation

## 2017-11-18 DIAGNOSIS — I714 Abdominal aortic aneurysm, without rupture: Secondary | ICD-10-CM | POA: Insufficient documentation

## 2018-06-13 ENCOUNTER — Emergency Department (HOSPITAL_COMMUNITY): Payer: Medicare HMO

## 2018-06-13 ENCOUNTER — Inpatient Hospital Stay (HOSPITAL_COMMUNITY)
Admission: EM | Admit: 2018-06-13 | Discharge: 2018-06-20 | DRG: 386 | Disposition: A | Discharge status: Home Health | Payer: Medicare HMO | Attending: Internal Medicine | Admitting: Internal Medicine

## 2018-06-13 ENCOUNTER — Encounter (HOSPITAL_COMMUNITY): Payer: Self-pay | Admitting: Emergency Medicine

## 2018-06-13 ENCOUNTER — Other Ambulatory Visit: Payer: Self-pay

## 2018-06-13 DIAGNOSIS — N189 Chronic kidney disease, unspecified: Secondary | ICD-10-CM | POA: Diagnosis present

## 2018-06-13 DIAGNOSIS — E871 Hypo-osmolality and hyponatremia: Secondary | ICD-10-CM | POA: Diagnosis present

## 2018-06-13 DIAGNOSIS — R011 Cardiac murmur, unspecified: Secondary | ICD-10-CM

## 2018-06-13 DIAGNOSIS — Z66 Do not resuscitate: Secondary | ICD-10-CM | POA: Diagnosis present

## 2018-06-13 DIAGNOSIS — E876 Hypokalemia: Secondary | ICD-10-CM | POA: Diagnosis not present

## 2018-06-13 DIAGNOSIS — Z6830 Body mass index (BMI) 30.0-30.9, adult: Secondary | ICD-10-CM

## 2018-06-13 DIAGNOSIS — R079 Chest pain, unspecified: Secondary | ICD-10-CM

## 2018-06-13 DIAGNOSIS — Z853 Personal history of malignant neoplasm of breast: Secondary | ICD-10-CM

## 2018-06-13 DIAGNOSIS — Z9221 Personal history of antineoplastic chemotherapy: Secondary | ICD-10-CM

## 2018-06-13 DIAGNOSIS — K51811 Other ulcerative colitis with rectal bleeding: Secondary | ICD-10-CM | POA: Diagnosis not present

## 2018-06-13 DIAGNOSIS — Z923 Personal history of irradiation: Secondary | ICD-10-CM

## 2018-06-13 DIAGNOSIS — R1084 Generalized abdominal pain: Secondary | ICD-10-CM

## 2018-06-13 DIAGNOSIS — K625 Hemorrhage of anus and rectum: Secondary | ICD-10-CM | POA: Diagnosis not present

## 2018-06-13 DIAGNOSIS — K51919 Ulcerative colitis, unspecified with unspecified complications: Secondary | ICD-10-CM | POA: Diagnosis present

## 2018-06-13 DIAGNOSIS — R5381 Other malaise: Secondary | ICD-10-CM | POA: Diagnosis present

## 2018-06-13 DIAGNOSIS — I480 Paroxysmal atrial fibrillation: Secondary | ICD-10-CM | POA: Diagnosis present

## 2018-06-13 DIAGNOSIS — E669 Obesity, unspecified: Secondary | ICD-10-CM | POA: Diagnosis present

## 2018-06-13 DIAGNOSIS — I2510 Atherosclerotic heart disease of native coronary artery without angina pectoris: Secondary | ICD-10-CM | POA: Diagnosis present

## 2018-06-13 DIAGNOSIS — K921 Melena: Secondary | ICD-10-CM

## 2018-06-13 DIAGNOSIS — Z96651 Presence of right artificial knee joint: Secondary | ICD-10-CM | POA: Diagnosis present

## 2018-06-13 DIAGNOSIS — Z90710 Acquired absence of both cervix and uterus: Secondary | ICD-10-CM

## 2018-06-13 DIAGNOSIS — K219 Gastro-esophageal reflux disease without esophagitis: Secondary | ICD-10-CM | POA: Diagnosis present

## 2018-06-13 DIAGNOSIS — E861 Hypovolemia: Secondary | ICD-10-CM | POA: Diagnosis present

## 2018-06-13 DIAGNOSIS — K922 Gastrointestinal hemorrhage, unspecified: Secondary | ICD-10-CM | POA: Diagnosis present

## 2018-06-13 DIAGNOSIS — K529 Noninfective gastroenteritis and colitis, unspecified: Secondary | ICD-10-CM

## 2018-06-13 DIAGNOSIS — I129 Hypertensive chronic kidney disease with stage 1 through stage 4 chronic kidney disease, or unspecified chronic kidney disease: Secondary | ICD-10-CM | POA: Diagnosis present

## 2018-06-13 LAB — CBC WITH DIFFERENTIAL/PLATELET
Abs Immature Granulocytes: 0.21 10*3/uL — ABNORMAL HIGH (ref 0.00–0.07)
BASOS PCT: 0 %
Basophils Absolute: 0 10*3/uL (ref 0.0–0.1)
EOS ABS: 0 10*3/uL (ref 0.0–0.5)
Eosinophils Relative: 0 %
HCT: 35 % — ABNORMAL LOW (ref 36.0–46.0)
Hemoglobin: 11.2 g/dL — ABNORMAL LOW (ref 12.0–15.0)
Immature Granulocytes: 3 %
Lymphocytes Relative: 13 %
Lymphs Abs: 1.1 10*3/uL (ref 0.7–4.0)
MCH: 30.2 pg (ref 26.0–34.0)
MCHC: 32 g/dL (ref 30.0–36.0)
MCV: 94.3 fL (ref 80.0–100.0)
MONOS PCT: 12 %
Monocytes Absolute: 1 10*3/uL (ref 0.1–1.0)
NRBC: 0 % (ref 0.0–0.2)
Neutro Abs: 5.9 10*3/uL (ref 1.7–7.7)
Neutrophils Relative %: 72 %
Platelets: 306 10*3/uL (ref 150–400)
RBC: 3.71 MIL/uL — ABNORMAL LOW (ref 3.87–5.11)
RDW: 12.8 % (ref 11.5–15.5)
WBC: 8.3 10*3/uL (ref 4.0–10.5)

## 2018-06-13 LAB — COMPREHENSIVE METABOLIC PANEL
ALBUMIN: 2 g/dL — AB (ref 3.5–5.0)
ALT: 13 U/L (ref 0–44)
ANION GAP: 9 (ref 5–15)
AST: 17 U/L (ref 15–41)
Alkaline Phosphatase: 82 U/L (ref 38–126)
BILIRUBIN TOTAL: 0.5 mg/dL (ref 0.3–1.2)
BUN: 12 mg/dL (ref 8–23)
CO2: 25 mmol/L (ref 22–32)
Calcium: 8.9 mg/dL (ref 8.9–10.3)
Chloride: 103 mmol/L (ref 98–111)
Creatinine, Ser: 0.53 mg/dL (ref 0.44–1.00)
GLUCOSE: 120 mg/dL — AB (ref 70–99)
POTASSIUM: 3.4 mmol/L — AB (ref 3.5–5.1)
Sodium: 137 mmol/L (ref 135–145)
Total Protein: 5.6 g/dL — ABNORMAL LOW (ref 6.5–8.1)

## 2018-06-13 LAB — CBC
HCT: 35.8 % — ABNORMAL LOW (ref 36.0–46.0)
Hemoglobin: 11.4 g/dL — ABNORMAL LOW (ref 12.0–15.0)
MCH: 30.2 pg (ref 26.0–34.0)
MCHC: 31.8 g/dL (ref 30.0–36.0)
MCV: 95 fL (ref 80.0–100.0)
PLATELETS: 301 10*3/uL (ref 150–400)
RBC: 3.77 MIL/uL — ABNORMAL LOW (ref 3.87–5.11)
RDW: 12.9 % (ref 11.5–15.5)
WBC: 8.7 10*3/uL (ref 4.0–10.5)
nRBC: 0 % (ref 0.0–0.2)

## 2018-06-13 LAB — I-STAT TROPONIN, ED: TROPONIN I, POC: 0.03 ng/mL (ref 0.00–0.08)

## 2018-06-13 LAB — LIPASE, BLOOD: LIPASE: 16 U/L (ref 11–51)

## 2018-06-13 LAB — I-STAT CG4 LACTIC ACID, ED: LACTIC ACID, VENOUS: 1.73 mmol/L (ref 0.5–1.9)

## 2018-06-13 MED ORDER — ACETAMINOPHEN 325 MG PO TABS
650.0000 mg | ORAL_TABLET | Freq: Four times a day (QID) | ORAL | Status: DC | PRN
  Administered 2018-06-16: 650 mg via ORAL
  Filled 2018-06-13: qty 2

## 2018-06-13 MED ORDER — SODIUM CHLORIDE 0.9 % IV BOLUS
500.0000 mL | Freq: Once | INTRAVENOUS | Status: AC
  Administered 2018-06-13: 500 mL via INTRAVENOUS

## 2018-06-13 MED ORDER — ONDANSETRON HCL 4 MG PO TABS: 4.0000 mg | ORAL_TABLET | Freq: Four times a day (QID) | ORAL | Status: DC | PRN

## 2018-06-13 MED ORDER — ONDANSETRON HCL 4 MG/2ML IJ SOLN: 4.0000 mg | Freq: Four times a day (QID) | INTRAMUSCULAR | Status: DC | PRN

## 2018-06-13 MED ORDER — SODIUM CHLORIDE 0.9 % IV SOLN
Freq: Once | INTRAVENOUS | Status: AC
  Administered 2018-06-13 – 2018-06-14 (×2): via INTRAVENOUS

## 2018-06-13 MED ORDER — FENTANYL CITRATE (PF) 100 MCG/2ML IJ SOLN
50.0000 ug | Freq: Once | INTRAMUSCULAR | Status: AC
  Administered 2018-06-13: 50 ug via INTRAVENOUS
  Filled 2018-06-13: qty 2

## 2018-06-13 MED ORDER — POTASSIUM CHLORIDE 10 MEQ/100ML IV SOLN
10.0000 meq | INTRAVENOUS | Status: AC
  Administered 2018-06-13 – 2018-06-14 (×2): 10 meq via INTRAVENOUS
  Filled 2018-06-13 (×3): qty 100

## 2018-06-13 MED ORDER — ACETAMINOPHEN 650 MG RE SUPP: 650.0000 mg | Freq: Four times a day (QID) | RECTAL | Status: DC | PRN

## 2018-06-13 MED ORDER — ONDANSETRON HCL 4 MG/2ML IJ SOLN
4.0000 mg | Freq: Once | INTRAMUSCULAR | Status: AC
  Administered 2018-06-13: 4 mg via INTRAVENOUS
  Filled 2018-06-13: qty 2

## 2018-06-13 MED ORDER — SODIUM CHLORIDE 0.9 % IV SOLN
INTRAVENOUS | Status: DC | PRN
  Administered 2018-06-13: 500 mL via INTRAVENOUS
  Administered 2018-06-14: 17:00:00 via INTRAVENOUS

## 2018-06-13 MED ORDER — HYDROCODONE-ACETAMINOPHEN 5-325 MG PO TABS
1.0000 | ORAL_TABLET | ORAL | Status: DC | PRN
  Administered 2018-06-13: 1 via ORAL
  Administered 2018-06-14: 2 via ORAL
  Administered 2018-06-14: 1 via ORAL
  Administered 2018-06-15 (×2): 2 via ORAL
  Administered 2018-06-15: 1 via ORAL
  Administered 2018-06-16: 2 via ORAL
  Administered 2018-06-16 – 2018-06-17 (×3): 1 via ORAL
  Administered 2018-06-18 – 2018-06-20 (×4): 2 via ORAL
  Filled 2018-06-13: qty 2
  Filled 2018-06-13 (×2): qty 1
  Filled 2018-06-13: qty 2
  Filled 2018-06-13: qty 1
  Filled 2018-06-13: qty 2
  Filled 2018-06-13: qty 1
  Filled 2018-06-13 (×5): qty 2
  Filled 2018-06-13 (×2): qty 1
  Filled 2018-06-13: qty 2

## 2018-06-13 NOTE — ED Notes (Signed)
IV team at bedside attempting to get IV access and blood

## 2018-06-13 NOTE — H&P (Signed)
Felicia Carlson:[id number] DOB: 28-Jan-1925 DOA: 06/13/2018     PCP: Rodena Medin, MD (Inactive)   Outpatient Specialists:     [facility] Daniels, MD     Patient arrived to ER on 06/13/18 at 1550  Patient coming from: home Lives alone,    Chief Complaint:  Chief Complaint  Patient presents with  . Abdominal Pain  . Rectal Bleeding    possible hemorrhoids    HPI: Felicia Carlson is a 84 y.o. female with medical history significant of ulcerative rectosigmoiditis (UC) Right-sided breast cancer currently in remission is post chemotherapy and radiation therapy, chronic kidney disease, GERD, hypertension  Presented with   2-week history of abdominal pain rectal bleeding for the past 2 weeks she was seen by her GI week ago she has been lightheaded when she tries to sit up or stand up history of ulcerative colitis with nearly daily blood in stools She has been having numerous episodes of bright red blood per rectum no associated fevers she has been having some nausea and vomiting but not currently only nausea now no dysuria.  She was seen at [facility] MD today noted to have abnormal ECG and was sent toER Reports she had some chest pain on and off today.   Regarding pertinent Chronic problems: Ulcerative colitis treated with Apriso and Rowasa (Apriso was too expensive symptoms of flare in 09/2017 and was switched to Sulfasalazine,  and prednisone taper but this caused her to be very dizzy, resolved with completion of prednisone taper Recently started on Mesalamine have been doing worse feeling lightheaded patient decreased her dose but still continues to feel very bad with frequent bloody bowel movements on a daily basis Story of hypertension on Norvasc Coreg      While in ER:  The following Work up has been ordered so far:  Orders Placed This Encounter  Procedures  . C difficile quick scan w PCR reflex  . CT Abdomen Pelvis Wo Contrast  . CBC with Diff  . CBC with  Differential/Platelet  . CBC with Differential/Platelet  . Comprehensive metabolic panel  . Lipase, blood  . Diet NPO time specified  . Consult to gastroenterology  . Consult to hospitalist  . Enteric precautions (UV disinfection)  . I-stat troponin, ED  . ED EKG  . EKG 12-Lead      Following Medications were ordered in ER: Medications  0.9 %  sodium chloride infusion ( Intravenous Stopped 06/13/18 2142)  0.9 %  sodium chloride infusion ( Intravenous Rate/Dose Change 06/13/18 2100)  sodium chloride 0.9 % bolus 500 mL (0 mLs Intravenous Stopped 06/13/18 1817)  fentaNYL (SUBLIMAZE) injection 50 mcg (50 mcg Intravenous Given 06/13/18 2005)  ondansetron (ZOFRAN) injection 4 mg (4 mg Intravenous Given 06/13/18 2005)    Significant initial  Findings: Abnormal Labs Reviewed  CBC WITH DIFFERENTIAL/PLATELET - Abnormal; Notable for the following components:      Result Value   RBC 3.71 (*)    Hemoglobin 11.2 (*)    HCT 35.0 (*)    Abs Immature Granulocytes 0.21 (*)    All other components within normal limits  COMPREHENSIVE METABOLIC PANEL - Abnormal; Notable for the following components:   Potassium 3.4 (*)    Glucose, Bld 120 (*)    Total Protein 5.6 (*)    Albumin 2.0 (*)    All other components within normal limits   Trop0.03  Na 137 K 3.4 BUN 12 Cr    stable,  Lab Results  Component Value Date   CREATININE 0.53 06/13/2018   CREATININE 0.92 10/27/2014   CREATININE 0.79 10/19/2014     Lipase 16 Lactic acid 1.73 WBC  8.3  HG/HCT  stable,       Component Value Date/Time   HGB 11.2 (L) 06/13/2018 1754   HGB 12.9 12/06/2008 1056   HCT 35.0 (L) 06/13/2018 1754   HCT 37.7 12/06/2008 1056    Troponin (Point of Care Test) Recent Labs    06/13/18 2127  TROPIPOC 0.03     BNP (last 3 results) No results for input(s): BNP in the last 8760 hours.  ProBNP (last 3 results) No results for input(s): PROBNP in the last 8760 hours.  Lactic Acid, Venous      Component Value Date/Time   LATICACIDVEN 1.73 06/13/2018 1738      UA  not ordered   CTabd/pelvis - Significant stool within the RIGHT aspect of the colon. Findings favor infectious/inflammatory or ischemic colitis  ECG:  Personally reviewed by me showing: HR : 98 Rhythm:  NSR,   no evidence of ischemic changes QTC 428     ED Triage Vitals  Enc Vitals Group     BP 06/13/18 1603 (!) 158/82     Pulse Rate 06/13/18 1603 92     Resp 06/13/18 1603 18     Temp 06/13/18 1603 98.4 F (36.9 C)     Temp Source 06/13/18 1603 Oral     SpO2 06/13/18 1601 97 %     Weight --      Height --      Head Circumference --      Peak Flow --      Pain Score 06/13/18 1602 4     Pain Loc --      Pain Edu? --      Excl. in [place]? --   TMAX(24)@       Latest  Blood pressure (!) 148/57, pulse 92, temperature 98.4 F (36.9 C), temperature source Oral, resp. rate 16, SpO2 97 %.   ER Provider Called: GI    Dr.Schooler?? They Recommend no steroids until C.dif neg Will see in AM    Hospitalist was called for admission for Gi bleed and ulcerative colitis flair   Review of Systems:    Pertinent positives include: fatigue, chills,  abdominal pain, nausea,blood in stool,   Constitutional:  No weight loss, night sweats, Fevers, weight loss  HEENT:  No headaches, Difficulty swallowing,Tooth/dental problems,Sore throat,  No sneezing, itching, ear ache, nasal congestion, post nasal drip,   Cardio-vascular:  No chest pain, Orthopnea, PND, anasarca, dizziness, palpitations.no Bilateral lower extremity swelling   GI:  No heartburn, indigestion, vomiting, diarrhea, change in bowel habits, loss of appetite, melena, hematemesis Resp:  no shortness of breath at rest. No dyspnea on exertion, No excess mucus, no productive cough, No non-productive cough, No coughing up of blood.No change in color of mucus.No wheezing. Skin:  no rash or lesions. No jaundice GU:  no dysuria, change in color of urine,  no urgency or frequency. No straining to urinate.  No flank pain.  Musculoskeletal:  No joint pain or no joint swelling. No decreased range of motion. No back pain.  Psych:  No change in mood or affect. No depression or anxiety. No memory loss.  Neuro: no localizing neurological complaints, no tingling, no weakness, no double vision, no gait abnormality, no slurred speech, no confusion  All systems reviewed and apart from HOPI all are  negative  Past Medical History:   Past Medical History:  Diagnosis Date  . Arthritis   . Cancer [facility]) 2007   breast cancer right side  . Chest pain   . Chronic kidney disease    stent in kidney right side  . Dyspnea   . GERD (gastroesophageal reflux disease)    sometimes  . Headache    shingles right side of head and right eye  . Hypertension   . Personal history of chemotherapy   . Personal history of radiation therapy       Past Surgical History:  Procedure Laterality Date  . ABDOMINAL HYSTERECTOMY    . APPENDECTOMY    . BREAST LUMPECTOMY Right 2007  . BREAST SURGERY Right 2011   lumpectomy  . EYE SURGERY Bilateral 2015   cataracts  . TONSILLECTOMY     as a child  . TOTAL KNEE ARTHROPLASTY Right 10/26/2014   Procedure: RIGHT TOTAL KNEE ARTHROPLASTY;  Surgeon: [name], MD;  Location: [facility];  Service: Orthopedics;  Laterality: Right;    Social History:  Ambulatory   walker        reports that she has never smoked. She has never used smokeless tobacco. She reports that she drinks alcohol. She reports that she does not use drugs.   Family History:   Family History  Problem Relation Age of Onset  . Breast cancer Sister 51    Allergies: Allergies  Allergen Reactions  . Isovue [Iopamidol] Hives and Rash    Pt  Had hives, rash and erythema her chest, face and neck.  Slight eye swelling.  Pt given 50 mg po benadryl.  Pt also developed severe abd pain during contrast injection and continued to get worse.  For those  reasons the radiologist had the pt transported by EMS to [facility].  Pt needs full premeds in the future.  J Bohm  RTRCT  . Tetanus Immune Globulin Swelling     Prior to Admission medications   Medication Sig Start Date End Date Taking? Authorizing Provider  amLODipine (NORVASC) 10 MG tablet Take 10 mg by mouth daily.   Yes [provider]  mesalamine (LIALDA) 1.2 g EC tablet Take 2.4 g by mouth daily with breakfast.  06/06/18  Yes [provider]  ALOE VERA PO Take 1 tablet by mouth daily.    [provider]  APPLE CIDER VINEGAR PO Take 1 tablet by mouth daily.    [provider]  Cholecalciferol (VITAMIN D-1000 MAX ST) 1000 units tablet Take 1,000 Units by mouth daily.     [provider]  furosemide (LASIX) 40 MG tablet Take 40 mg by mouth daily.    [provider]  HAWTHORNE BERRY PO Take 1 tablet by mouth daily.    [provider]  meloxicam (MOBIC) 15 MG tablet Take 15 mg by mouth daily.  05/21/18   [provider]  oxyCODONE-acetaminophen (ROXICET) 5-325 MG per tablet Take 1-2 tablets by mouth every 4 (four) hours as needed. Patient not taking: Reported on 01/07/2017 10/28/14   [name], MD  rivaroxaban (XARELTO) 10 MG TABS tablet Take 1 tablet (10 mg total) by mouth daily with breakfast. Patient not taking: Reported on 01/07/2017 10/28/14   [name], MD   Physical Exam: Blood pressure (!) 148/57, pulse 92, temperature 98.4 F (36.9 C), temperature source Oral, resp. rate 16, SpO2 97 %. 1. General:  in No Acute distress  Chronically ill  -appearing 2. Psychological:  Alert and Oriented 3. Head/ENT:     Dry Mucous Membranes                          Head Non traumatic, neck supple                           Poor Dentition 4. SKIN:   decreased Skin turgor,  Skin clean Dry and intact no rash 5. Heart: Regular rate and rhythm  Systolic  Murmur, no Rub or gallop 6. Lungs:  Clear to auscultation  bilaterally, no wheezes or crackles   7. Abdomen: Soft, diffusely tender, Non distended  obese  bowel sounds present 8. Lower extremities: no clubbing, cyanosis, or  edema 9. Neurologically Grossly intact, moving all 4 extremities equally   10. MSK: Normal range of motion   LABS:     Recent Labs  Lab 06/13/18 1754  WBC 8.3  NEUTROABS 5.9  HGB 11.2*  HCT 35.0*  MCV 94.3  PLT 161   Basic Metabolic Panel: Recent Labs  Lab 06/13/18 1754  NA 137  K 3.4*  CL 103  CO2 25  GLUCOSE 120*  BUN 12  CREATININE 0.53  CALCIUM 8.9      Recent Labs  Lab 06/13/18 1754  AST 17  ALT 13  ALKPHOS 82  BILITOT 0.5  PROT 5.6*  ALBUMIN 2.0*   Recent Labs  Lab 06/13/18 1754  LIPASE 16   No results for input(s): AMMONIA in the last 168 hours.    HbA1C: No results for input(s): HGBA1C in the last 72 hours. CBG: No results for input(s): GLUCAP in the last 168 hours.    Urine analysis:    Component Value Date/Time   COLORURINE YELLOW 11/02/2010 0300   APPEARANCEUR CLEAR 11/02/2010 0300   LABSPEC 1.012 11/02/2010 0300   PHURINE 5.0 11/02/2010 0300   GLUCOSEU NEGATIVE 11/02/2010 0300   HGBUR NEGATIVE 11/02/2010 0300   BILIRUBINUR NEGATIVE 11/02/2010 0300   KETONESUR NEGATIVE 11/02/2010 0300   PROTEINUR NEGATIVE 11/02/2010 0300   UROBILINOGEN 0.2 11/02/2010 0300   NITRITE NEGATIVE 11/02/2010 0300   LEUKOCYTESUR  11/02/2010 0300    NEGATIVE MICROSCOPIC NOT DONE ON URINES WITH NEGATIVE PROTEIN, BLOOD, LEUKOCYTES, NITRITE, OR GLUCOSE <1000 mg/dL.      Cultures:    Component Value Date/Time   SDES URINE, CLEAN CATCH 11/02/2010 0300   SPECREQUEST NONE 11/02/2010 0300   CULT NO GROWTH 11/02/2010 0300   REPTSTATUS 11/03/2010 FINAL 11/02/2010 0300     Radiological Exams on Admission: Ct Abdomen Pelvis Wo Contrast  Result Date: 06/13/2018 CLINICAL DATA:  generalized abdominal pain, greatest over right abdomen, diarrhea for 2-3 weeks. Previous appendectomy, hysterectomy  and RIGHT breast lumpectomy. EXAM: CT ABDOMEN AND PELVIS WITHOUT CONTRAST TECHNIQUE: Multidetector CT imaging of the abdomen and pelvis was performed following the standard protocol without IV contrast. COMPARISON:  CT of the abdomen and pelvis on 09/26/2017 FINDINGS: Lower chest: There is subsegmental atelectasis at the lung bases. Coronary artery calcifications are present. Hepatobiliary: There is a stable 11 millimeter probable cyst within the LEFT hepatic lobe. Gallbladder is present. There are dependent partially calcified gallstones without CT evidence for acute cholecystitis. Pancreas: Unremarkable. No pancreatic ductal dilatation or surrounding inflammatory changes. Spleen: Calcified splenic granulomata. Otherwise the spleen is normal in appearance. Adrenals/Urinary Tract: Normal adrenal glands. Stable cyst within the UPPER pole of the LEFT kidney. There are punctate intrarenal calcifications bilaterally. No hydronephrosis.  No ureteral obstruction. The bladder and visualized portion of the urethra are normal. Stomach/Bowel: The stomach and small bowel loops are normal in appearance. There is a large amount of stool particularly within the RIGHT colon. There is dilatation and wall thickening of the sigmoid colon. The wall thickening continues to level of the anus. There are scattered diverticula within this region the appearance favors inflammatory, ischemic, or infectious colitis over acute diverticulitis. Although thickening of the colonic wall throughout the sigmoid colon and rectum has been demonstrated on prior exams, the dilatation of this segment appears new or different. Mesenteric edema is noted in the pelvis. Vascular/Lymphatic: Small mesenteric lymph nodes are identified in the pelvis. No significant retroperitoneal adenopathy. There is significant atherosclerotic calcification of the abdominal aorta not associated aneurysm. Patient has a RIGHT renal artery stent. Reproductive: Hysterectomy.  No  adnexal mass. Other: No free pelvic fluid. Anterior abdominal wall is unremarkable. Musculoskeletal: There are degenerative changes in the LOWER thoracic spine and lumbar spine. No suspicious lytic or blastic lesions are identified. Stable small bone islands are identified in the LEFT ilium and RIGHT femoral head. IMPRESSION: 1. Significant stool within the RIGHT aspect of the colon. 2. Colonic diverticulosis. 3. Dilatation and wall thickening of the sigmoid colon which extends to the rectum. Associated mesenteric edema. Small mesocolon lymph nodes are also present. Findings favor infectious/inflammatory or ischemic colitis over diverticulitis. 4. Nonobstructing nephrolithiasis. 5. Splenic granulomata. 6. Coronary artery disease. 7.  Aortic atherosclerosis.  (ICD10-I70.0) 8. Hysterectomy. Electronically Signed   By: Nolon Nations M.D.   On: 06/13/2018 20:45    Chart has been reviewed    Assessment/Plan  84 y.o. female with medical history significant of ulcerative rectosigmoiditis (UC) Right-sided breast cancer currently in remission is post chemotherapy and radiation therapy, chronic kidney disease, GERD, hypertension Admitted for GI bleed secondary to ulcerative colitis flare  Present on Admission: . Lower GI bleed -likely secondary to ulcerative colitis.  Appreciate GI consult will rehydrate monitor frequent CBC and transfuse if needed . Ulcerative colitis with complication (HCC) with significant bleeding CT of abdomen showing colitis inflammatory versus infectious patient has known history of ulcerative colitis.  GI at [facility] consult regarding further management . Chest pain atypical we will cycle cardiac enzymes monitor on telemetry . Debility will need PT OT evaluation continue to monitor . Hypokalemia replace and check magnesium level . Cardiac murmur given chest pain lightheadedness and murmur and elderly female we will check cardiac echogram to evaluate for any  significant valvular disease Decreased p.o. intake will check a prealbumin and consult dietitian    Other plan as per orders.  DVT prophylaxis:  SCD      Code Status:    DNR/DNI  as per patient   I had personally discussed CODE STATUS with patient      Family Communication:   Family not  at  Bedside   Disposition Plan:       To home once workup is complete and patient is stable                                      Consults called: GI is aware    Admission status:    Obs    Level of care    tele    24H        Arriyanna Mersch 06/14/2018, 12:24 AM    [facility]  Pager [phone]   after 2 AM please page floor coverage PA If 7AM-7PM, please contact the day team taking care of the patient  [email or web address]  Password [email or web address]

## 2018-06-13 NOTE — ED Notes (Signed)
Bed: ZS82 Expected date:  Expected time:  Means of arrival:  Comments: 84 yo abd pain, rectal bleeding

## 2018-06-13 NOTE — ED Notes (Signed)
URINE SAMPLE AT BEDSIDE PRN 

## 2018-06-13 NOTE — ED Notes (Signed)
IV team at bedside 

## 2018-06-13 NOTE — ED Provider Notes (Signed)
[place] DEPT Provider Note   CSN: [id number] Arrival date & time: 06/13/18  1550     History   Chief Complaint Chief Complaint  Patient presents with  . Abdominal Pain  . Rectal Bleeding    possible hemorrhoids    HPI JENAI SCALETTA is a 84 y.o. female.  The history is provided by the patient. No language interpreter was used.  Abdominal Pain   Associated symptoms include hematochezia.  Rectal Bleeding  Associated symptoms: abdominal pain    DEJAH DROESSLER is a 84 y.o. female who presents to the Emergency Department complaining of abdominal pain, rectal bleeding.  She presents to the emergency department for three weeks of right lower quadrant abdominal pain. Pain is constant nature and worse with movement. She reports numerous episodes of bright red blood per rectum as well. No fevers. She vomited once two weeks ago. She does have significant nausea. No dysuria. She has a history of ulcerative colitis and is followed by G.I. at [facility]. Past Medical History:  Diagnosis Date  . Arthritis   . Cancer [facility]) 2007   breast cancer right side  . Chest pain   . Chronic kidney disease    stent in kidney right side  . Dyspnea   . GERD (gastroesophageal reflux disease)    sometimes  . Headache    shingles right side of head and right eye  . Hypertension   . Personal history of chemotherapy   . Personal history of radiation therapy     Patient Active Problem List   Diagnosis Date Noted  . Cardiac murmur 06/14/2018  . Lower GI bleed 06/13/2018  . Ulcerative colitis with complication ([place]) 40/98/1191  . Chest pain 06/13/2018  . Debility 06/13/2018  . Hypokalemia 06/13/2018  . Osteoarthritis of right knee 10/26/2014  . Status post total right knee replacement 10/26/2014    Past Surgical History:  Procedure Laterality Date  . ABDOMINAL HYSTERECTOMY    . APPENDECTOMY    . BREAST LUMPECTOMY Right 2007  . BREAST SURGERY Right 2011   lumpectomy   . EYE SURGERY Bilateral 2015   cataracts  . TONSILLECTOMY     as a child  . TOTAL KNEE ARTHROPLASTY Right 10/26/2014   Procedure: RIGHT TOTAL KNEE ARTHROPLASTY;  Surgeon: [name], MD;  Location: [facility];  Service: Orthopedics;  Laterality: Right;     OB History   None      Home Medications    Prior to Admission medications   Medication Sig Start Date End Date Taking? Authorizing Provider  amLODipine (NORVASC) 10 MG tablet Take 10 mg by mouth daily.   Yes [provider]  mesalamine (LIALDA) 1.2 g EC tablet Take 2.4 g by mouth daily with breakfast.  06/06/18  Yes [provider]  ALOE VERA PO Take 1 tablet by mouth daily.    [provider]  APPLE CIDER VINEGAR PO Take 1 tablet by mouth daily.    [provider]  Cholecalciferol (VITAMIN D-1000 MAX ST) 1000 units tablet Take 1,000 Units by mouth daily.     [provider]  furosemide (LASIX) 40 MG tablet Take 40 mg by mouth daily.    [provider]  HAWTHORNE BERRY PO Take 1 tablet by mouth daily.    [provider]  meloxicam (MOBIC) 15 MG tablet Take 15 mg by mouth daily.  05/21/18   [provider]  oxyCODONE-acetaminophen (ROXICET) 5-325 MG per tablet Take 1-2 tablets  by mouth every 4 (four) hours as needed. Patient not taking: Reported on 01/07/2017 10/28/14   [name], MD  rivaroxaban (XARELTO) 10 MG TABS tablet Take 1 tablet (10 mg total) by mouth daily with breakfast. Patient not taking: Reported on 01/07/2017 10/28/14   [name], MD    Family History Family History  Problem Relation Age of Onset  . Breast cancer Sister 94    Social History Social History   Tobacco Use  . Smoking status: Never Smoker  . Smokeless tobacco: Never Used  Substance Use Topics  . Alcohol use: Yes    Comment: wine occasional  . Drug use: No     Allergies   Isovue [iopamidol] and Tetanus immune globulin   Review of  Systems Review of Systems  Gastrointestinal: Positive for abdominal pain and hematochezia.  All other systems reviewed and are negative.    Physical Exam Updated Vital Signs BP 124/60 (BP Location: Right Arm)   Pulse 73   Temp 98.7 F (37.1 C) (Oral)   Resp 18   SpO2 95%   Physical Exam  Constitutional: She is oriented to person, place, and time. She appears well-developed and well-nourished.  HENT:  Head: Normocephalic and atraumatic.  Cardiovascular: Normal rate and regular rhythm.  No murmur heard. Pulmonary/Chest: Effort normal and breath sounds normal. No respiratory distress.  Abdominal: Soft. There is no rebound.  Moderate generalized tenderness, greatest over the right lower quadrant. Voluntary guarding.  Musculoskeletal: She exhibits no edema or tenderness.  Neurological: She is alert and oriented to person, place, and time.  Skin: Skin is warm and dry.  Psychiatric: She has a normal mood and affect. Her behavior is normal.  Nursing note and vitals reviewed.    ED Treatments / Results  Labs (all labs ordered are listed, but only abnormal results are displayed) Labs Reviewed  CBC WITH DIFFERENTIAL/PLATELET - Abnormal; Notable for the following components:      Result Value   RBC 3.71 (*)    Hemoglobin 11.2 (*)    HCT 35.0 (*)    Abs Immature Granulocytes 0.21 (*)    All other components within normal limits  COMPREHENSIVE METABOLIC PANEL - Abnormal; Notable for the following components:   Potassium 3.4 (*)    Glucose, Bld 120 (*)    Total Protein 5.6 (*)    Albumin 2.0 (*)    All other components within normal limits  CBC - Abnormal; Notable for the following components:   RBC 3.77 (*)    Hemoglobin 11.4 (*)    HCT 35.8 (*)    All other components within normal limits  MAGNESIUM - Abnormal; Notable for the following components:   Magnesium 1.6 (*)    All other components within normal limits  C DIFFICILE QUICK SCREEN W PCR REFLEX  GASTROINTESTINAL  PANEL BY PCR, STOOL (REPLACES STOOL CULTURE)  LIPASE, BLOOD  TROPONIN I  LACTIC ACID, PLASMA  CBC WITH DIFFERENTIAL/PLATELET  CBC WITH DIFFERENTIAL/PLATELET  CBC  TROPONIN I  TROPONIN I  MAGNESIUM  PHOSPHORUS  TSH  COMPREHENSIVE METABOLIC PANEL  CBC  PREALBUMIN  CBC  CBC  I-STAT CG4 LACTIC ACID, ED  I-STAT TROPONIN, ED  TYPE AND SCREEN    EKG EKG Interpretation  Date/Time:  Friday June 13 2018 21:22:11 EDT Ventricular Rate:  98 PR Interval:    QRS Duration: 77 QT Interval:  338 QTC Calculation: 423 R Axis:   44 Text Interpretation:  Sinus rhythm Supraventricular bigeminy Sinus pause Anteroseptal  infarct, old Nonspecific T abnormalities, lateral leads Confirmed by Quintella Reichert [phone]) on 06/13/2018 10:13:38 PM   Radiology Ct Abdomen Pelvis Wo Contrast  Result Date: 06/13/2018 CLINICAL DATA:  generalized abdominal pain, greatest over right abdomen, diarrhea for 2-3 weeks. Previous appendectomy, hysterectomy and RIGHT breast lumpectomy. EXAM: CT ABDOMEN AND PELVIS WITHOUT CONTRAST TECHNIQUE: Multidetector CT imaging of the abdomen and pelvis was performed following the standard protocol without IV contrast. COMPARISON:  CT of the abdomen and pelvis on 09/26/2017 FINDINGS: Lower chest: There is subsegmental atelectasis at the lung bases. Coronary artery calcifications are present. Hepatobiliary: There is a stable 11 millimeter probable cyst within the LEFT hepatic lobe. Gallbladder is present. There are dependent partially calcified gallstones without CT evidence for acute cholecystitis. Pancreas: Unremarkable. No pancreatic ductal dilatation or surrounding inflammatory changes. Spleen: Calcified splenic granulomata. Otherwise the spleen is normal in appearance. Adrenals/Urinary Tract: Normal adrenal glands. Stable cyst within the UPPER pole of the LEFT kidney. There are punctate intrarenal calcifications bilaterally. No hydronephrosis. No ureteral obstruction. The bladder  and visualized portion of the urethra are normal. Stomach/Bowel: The stomach and small bowel loops are normal in appearance. There is a large amount of stool particularly within the RIGHT colon. There is dilatation and wall thickening of the sigmoid colon. The wall thickening continues to level of the anus. There are scattered diverticula within this region the appearance favors inflammatory, ischemic, or infectious colitis over acute diverticulitis. Although thickening of the colonic wall throughout the sigmoid colon and rectum has been demonstrated on prior exams, the dilatation of this segment appears new or different. Mesenteric edema is noted in the pelvis. Vascular/Lymphatic: Small mesenteric lymph nodes are identified in the pelvis. No significant retroperitoneal adenopathy. There is significant atherosclerotic calcification of the abdominal aorta not associated aneurysm. Patient has a RIGHT renal artery stent. Reproductive: Hysterectomy.  No adnexal mass. Other: No free pelvic fluid. Anterior abdominal wall is unremarkable. Musculoskeletal: There are degenerative changes in the LOWER thoracic spine and lumbar spine. No suspicious lytic or blastic lesions are identified. Stable small bone islands are identified in the LEFT ilium and RIGHT femoral head. IMPRESSION: 1. Significant stool within the RIGHT aspect of the colon. 2. Colonic diverticulosis. 3. Dilatation and wall thickening of the sigmoid colon which extends to the rectum. Associated mesenteric edema. Small mesocolon lymph nodes are also present. Findings favor infectious/inflammatory or ischemic colitis over diverticulitis. 4. Nonobstructing nephrolithiasis. 5. Splenic granulomata. 6. Coronary artery disease. 7.  Aortic atherosclerosis.  (ICD10-I70.0) 8. Hysterectomy. Electronically Signed   By: Nolon Nations M.D.   On: 06/13/2018 20:45    Procedures Procedures (including critical care time)  Medications Ordered in ED Medications  0.9 %   sodium chloride infusion ( Intravenous Stopped 06/13/18 2142)  acetaminophen (TYLENOL) tablet 650 mg (has no administration in time range)    Or  acetaminophen (TYLENOL) suppository 650 mg (has no administration in time range)  HYDROcodone-acetaminophen (NORCO/VICODIN) 5-325 MG per tablet 1-2 tablet (1 tablet Oral Given 06/13/18 2312)  ondansetron (ZOFRAN) tablet 4 mg (has no administration in time range)    Or  ondansetron (ZOFRAN) injection 4 mg (has no administration in time range)  potassium chloride 10 mEq in 100 mL IVPB (10 mEq Intravenous New Bag/Given 06/13/18 2349)  sodium chloride 0.9 % bolus 500 mL (0 mLs Intravenous Stopped 06/13/18 1817)  fentaNYL (SUBLIMAZE) injection 50 mcg (50 mcg Intravenous Given 06/13/18 2005)  ondansetron (ZOFRAN) injection 4 mg (4 mg Intravenous Given 06/13/18 2005)  0.9 %  sodium chloride infusion ( Intravenous New Bag/Given 06/13/18 2348)     Initial Impression / Assessment and Plan / ED Course  I have reviewed the triage vital signs and the nursing notes.  Pertinent labs & imaging results that were available during my care of the patient were reviewed by me and considered in my medical decision making (see chart for details).     Patient with history of ulcerative colitis diagnosed in 2018 on colonoscopy here for evaluation of progressive abdominal pain and hematochezia. She has significant tenderness on examination. Hemoglobin is stable. Records reviewed in care everywhere. Given the degree of tenderness a CT abdomen was obtained. CT demonstrates mesenteric edema and colitis, unclear etiology. Discussed with gastroenterologist on-call, recommends IV fluids, C diff study. Medicine consulted for admission for observation and further management.  Final Clinical Impressions(s) / ED Diagnoses   Final diagnoses:  None    ED Discharge Orders    None       Quintella Reichert, MD 06/14/18 0040

## 2018-06-13 NOTE — ED Triage Notes (Signed)
Pt arrived via [facility] from doctors office 2 weeks ago abdominal pain started. Pt having rectal bleeding for two weeks Last week patient went to [facility] Pt got eva0uationl today, and stated she may have hemorrhoids, but unsure of why she has abdominal pain  Pt gets light headed and dizzy when she stands.  EKG on EMS showed PVCs and PACs but that is baseline for her.

## 2018-06-14 ENCOUNTER — Observation Stay: Payer: Self-pay

## 2018-06-14 ENCOUNTER — Observation Stay (HOSPITAL_BASED_OUTPATIENT_CLINIC_OR_DEPARTMENT_OTHER): Payer: Medicare HMO

## 2018-06-14 ENCOUNTER — Encounter (HOSPITAL_COMMUNITY): Payer: Self-pay

## 2018-06-14 DIAGNOSIS — K922 Gastrointestinal hemorrhage, unspecified: Secondary | ICD-10-CM | POA: Diagnosis not present

## 2018-06-14 DIAGNOSIS — R011 Cardiac murmur, unspecified: Secondary | ICD-10-CM | POA: Diagnosis present

## 2018-06-14 DIAGNOSIS — I361 Nonrheumatic tricuspid (valve) insufficiency: Secondary | ICD-10-CM

## 2018-06-14 LAB — ECHOCARDIOGRAM COMPLETE

## 2018-06-14 LAB — C DIFFICILE QUICK SCREEN W PCR REFLEX
C DIFFICILE (CDIFF) TOXIN: NEGATIVE
C Diff antigen: NEGATIVE
C Diff interpretation: NOT DETECTED

## 2018-06-14 LAB — ABO/RH: ABO/RH(D): O POS

## 2018-06-14 LAB — TYPE AND SCREEN
ABO/RH(D): O POS
Antibody Screen: NEGATIVE

## 2018-06-14 LAB — MAGNESIUM: MAGNESIUM: 1.6 mg/dL — AB (ref 1.7–2.4)

## 2018-06-14 LAB — TROPONIN I
Troponin I: <0.03 ng/mL (ref <0.03)
Troponin I: <0.03 ng/mL (ref <0.03)

## 2018-06-14 LAB — SEDIMENTATION RATE: Sed Rate: 46 mm/hr — ABNORMAL HIGH (ref 0–22)

## 2018-06-14 LAB — LACTIC ACID, PLASMA: Lactic Acid, Venous: 1.6 mmol/L (ref 0.5–1.9)

## 2018-06-14 MED ORDER — SODIUM CHLORIDE 0.9% FLUSH
10.0000 mL | INTRAVENOUS | Status: DC | PRN
  Administered 2018-06-18 – 2018-06-19 (×2): 10 mL
  Filled 2018-06-14 (×2): qty 40

## 2018-06-14 MED ORDER — MESALAMINE 1.2 G PO TBEC
2.4000 g | DELAYED_RELEASE_TABLET | Freq: Every day | ORAL | Status: DC
  Administered 2018-06-14 – 2018-06-20 (×7): 2.4 g via ORAL
  Filled 2018-06-14 (×7): qty 2

## 2018-06-14 MED ORDER — AMLODIPINE BESYLATE 10 MG PO TABS
10.0000 mg | ORAL_TABLET | Freq: Every day | ORAL | Status: DC
  Administered 2018-06-14 – 2018-06-20 (×7): 10 mg via ORAL
  Filled 2018-06-14 (×7): qty 1

## 2018-06-14 MED ORDER — METHYLPREDNISOLONE SODIUM SUCC 125 MG IJ SOLR
60.0000 mg | Freq: Two times a day (BID) | INTRAMUSCULAR | Status: DC
  Administered 2018-06-14 – 2018-06-19 (×10): 60 mg via INTRAVENOUS
  Filled 2018-06-14 (×10): qty 2

## 2018-06-14 MED ORDER — SODIUM CHLORIDE 0.9% FLUSH
10.0000 mL | Freq: Two times a day (BID) | INTRAVENOUS | Status: DC
  Administered 2018-06-15 – 2018-06-19 (×7): 10 mL

## 2018-06-14 MED ORDER — VITAMIN D 1000 UNITS PO TABS
1000.0000 [IU] | ORAL_TABLET | Freq: Every day | ORAL | Status: DC
  Administered 2018-06-14 – 2018-06-20 (×7): 1000 [IU] via ORAL
  Filled 2018-06-14 (×7): qty 1

## 2018-06-14 NOTE — Evaluation (Signed)
Physical Therapy Evaluation Patient Details Name: Felicia Carlson MRN: [id number] DOB: June 29, 1925 Today's Date: 06/14/2018   History of Present Illness  84 yo female admitted with LGIB, chest pain, abnormal ECG. hx of breast ca, UC, CKD  Clinical Impression  On eval, pt required Mod assist for mobility. She was able to stand and pivot x 2, bed<>bsc, with assistance. Activity limited by significant bloody drainage. Pt presents with general weakness, decreased activity tolerance, and impaired gait and balance. Recommend ST rehab at SNF if pt is agreeable. If she declines SNF, recommend HHPT and 24 hour supervision/asisst. Will follow and progress activity as tolerated.     Follow Up Recommendations SNF(HHPT if pt progresses well and has assist at home)    Equipment Recommendations  None recommended by PT    Recommendations for Other Services       Precautions / Restrictions Precautions Precautions: Fall Precaution Comments: bloody drainage Restrictions Weight Bearing Restrictions: No      Mobility  Bed Mobility Overal bed mobility: Needs Assistance Bed Mobility: Supine to Sit;Sit to Supine     Supine to sit: Min assist;HOB elevated Sit to supine: Min assist;HOB elevated   General bed mobility comments: Increased time.   Transfers Overall transfer level: Needs assistance   Transfers: Sit to/from Stand;Stand Pivot Transfers Sit to Stand: Min assist Stand pivot transfers: Min assist       General transfer comment: Assist to rise, stabilize, control descent. Pt required 2 hand support on bed/bsc armrests for balance/assist. Increased time. Stand pivot x 2, bed<>bsc.   Ambulation/Gait             General Gait Details: NT  Stairs            Wheelchair Mobility    Modified Rankin (Stroke Patients Only)       Balance Overall balance assessment: Needs assistance         Standing balance support: Bilateral upper extremity supported Standing  balance-Leahy Scale: Poor                               Pertinent Vitals/Pain Pain Assessment: 0-10 Pain Score: 8  Pain Location: abdomen, back Pain Descriptors / Indicators: Aching;Discomfort Pain Intervention(s): Limited activity within patient's tolerance    Home Living Family/patient expects to be discharged to:: Private residence Living Arrangements: Alone Available Help at Discharge: Family;Available PRN/intermittently(daughter n law helping/staying with pt recently) Type of Home: House Home Access: Level entry     Home Layout: One level Home Equipment: Walker - 2 wheels;Cane - single point      Prior Function Level of Independence: Needs assistance   Gait / Transfers Assistance Needed: uses RW vs cane  ADL's / Homemaking Assistance Needed: daughter n law assists as needed        Hand Dominance        Extremity/Trunk Assessment   Upper Extremity Assessment Upper Extremity Assessment: Defer to OT evaluation    Lower Extremity Assessment Lower Extremity Assessment: Generalized weakness    Cervical / Trunk Assessment Cervical / Trunk Assessment: Normal  Communication   Communication: No difficulties  Cognition Arousal/Alertness: Awake/alert Behavior During Therapy: WFL for tasks assessed/performed Overall Cognitive Status: Within Functional Limits for tasks assessed  General Comments      Exercises     Assessment/Plan    PT Assessment Patient needs continued PT services  PT Problem List Decreased strength;Decreased balance;Decreased mobility;Decreased activity tolerance;Decreased knowledge of use of DME       PT Treatment Interventions DME instruction;Gait training;Functional mobility training;Therapeutic activities;Balance training;Patient/family education;Therapeutic exercise    PT Goals (Current goals can be found in the Care Plan section)  Acute Rehab PT Goals Patient  Stated Goal: none stated PT Goal Formulation: With patient Time For Goal Achievement: 06/28/18 Potential to Achieve Goals: Good    Frequency Min 3X/week   Barriers to discharge        Co-evaluation               AM-PAC PT "6 Clicks" Daily Activity  Outcome Measure Difficulty turning over in bed (including adjusting bedclothes, sheets and blankets)?: A Little Difficulty moving from lying on back to sitting on the side of the bed? : Unable Difficulty sitting down on and standing up from a chair with arms (e.g., wheelchair, bedside commode, etc,.)?: Unable Help needed moving to and from a bed to chair (including a wheelchair)?: A Lot Help needed walking in hospital room?: A Lot Help needed climbing 3-5 steps with a railing? : A Lot 6 Click Score: 11    End of Session   Activity Tolerance: Patient limited by fatigue Patient left: in bed;with call bell/phone within reach;with bed alarm set   PT Visit Diagnosis: Muscle weakness (generalized) (M62.81);Difficulty in walking, not elsewhere classified (R26.2)    Time: 2952-8413 PT Time Calculation (min) (ACUTE ONLY): 19 min   Charges:   PT Evaluation $PT Eval Moderate Complexity: [place], PT Acute Rehabilitation Services Pager: [phone] Office: [phone]

## 2018-06-14 NOTE — Progress Notes (Signed)
Peripherally Inserted Central Catheter/Midline Placement  The IV Nurse has discussed with the patient and/or persons authorized to consent for the patient, the purpose of this procedure and the potential benefits and risks involved with this procedure.  The benefits include less needle sticks, lab draws from the catheter, and the patient may be discharged home with the catheter. Risks include, but not limited to, infection, bleeding, blood clot (thrombus formation), and puncture of an artery; nerve damage and irregular heartbeat and possibility to perform a PICC exchange if needed/ordered by physician.  Alternatives to this procedure were also discussed.  Bard Power PICC patient education guide, fact sheet on infection prevention and patient information card has been provided to patient /or left at bedside.    PICC/Midline Placement Documentation  PICC Single Lumen 81/85/90 PICC Left Basilic 38 cm 0 cm (Active)  Indication for Insertion or Continuance of Line Limited venous access - need for IV therapy >5 days (PICC only);Poor Vasculature-patient has had multiple peripheral attempts or PIVs lasting less than 24 hours 06/14/2018  4:48 PM  Exposed Catheter (cm) 0 cm 06/14/2018  4:48 PM  Site Assessment Clean;Dry;Intact 06/14/2018  4:48 PM  Line Status Flushed;Saline locked;Blood return noted 06/14/2018  4:48 PM  Dressing Type Transparent 06/14/2018  4:48 PM  Dressing Status Clean;Dry;Intact;Antimicrobial disc in place 06/14/2018  4:48 PM  Line Care Connections checked and tightened 06/14/2018  4:48 PM  Line Adjustment (NICU/IV Team Only) No 06/14/2018  4:48 PM  Dressing Intervention New dressing 06/14/2018  4:48 PM  Dressing Change Due 06/21/18 06/14/2018  4:48 PM       Rolena Infante 06/14/2018, 4:49 PM

## 2018-06-14 NOTE — Progress Notes (Signed)
PROGRESS NOTE    Felicia Carlson  DPO:[id number] DOB: [birth year] DOA: 06/13/2018 PCP: Rodena Medin, MD (Inactive)    Brief Narrative:  84 year old woman with past medical history relevant for ulcerative colitis followed at [facility], CKD unknown stage, hypertension, breast cancer status post chemoradiation in remission admitted with abdominal pain, hematochezia and CT findings concerning for rectosigmoid colitis.   Assessment & Plan:   Active Problems:   Lower GI bleed   Ulcerative colitis with complication (HCC)   Chest pain   Debility   Hypokalemia   Cardiac murmur   #) Abdominal pain/hematochezia/rectosigmoid colitis: At this time the differential diagnosis includes ulcerative colitis exacerbation versus infectious enterocolitis.  She continues to have persistent abdominal pain as well as hematochezia. -We will check ESR and CRP -Fecal calprotectin ordered - Gastroenterology consult [facility] -We will hold on immunosuppression until infectious work-up is negative -Pending C. difficile and stool culture -Continue mesalamine 2.4 g with breakfast, apparently patient had difficulty affording Solimene and so was transitioned to sulfasalazine as an outpatient, will clarify with GI -Clear liquid diet - IV fluids  #) CKD: Patient carries a diagnosis however her creatinine here is normal.  It might be underestimating due to her frailty however it is not clear that the patient does have CKD.  #) Hypertension: -Continue amlodipine 10 mg daily  Fluids: Gentle IV fluids Electrodes: Monitor and supplement Nutrition: Clear liquid diet  Prophylaxis: SCDs  Disposition: Pending resolution of hematochezia  DO NOT RESUSCITATE   Consultants:   Gastroenterology  Procedures:   None  Antimicrobials:   None   Subjective: Patient reports she continues to have abdominal pain.  The pain is diffuse and fairly sharp.  On discussion with the patient and the nurse she  continues to have a significant amount of hematochezia and tenesmus.  She also has a single stool and feces and so the stool was unable to be sent off for testing.  Objective: Vitals:   06/13/18 2230 06/13/18 2300 06/13/18 2333 06/14/18 0543  BP: (!) 151/60 (!) 151/62 124/60 (!) 142/60  Pulse: 93 92 73 85  Resp:   18 18  Temp:   98.7 F (37.1 C) 98.1 F (36.7 C)  TempSrc:   Oral Oral  SpO2: 98% 97% 95% 96%    Intake/Output Summary (Last 24 hours) at 06/14/2018 1049 Last data filed at 06/14/2018 1000 Gross per 24 hour  Intake 750.5 ml  Output -  Net 750.5 ml   There were no vitals filed for this visit.  Examination:  General exam: Younger than stated age, lying in bed, in mild distress Respiratory system: Clear to auscultation. Respiratory effort normal. Cardiovascular system: Regular rate and rhythm, no murmurs. Gastrointestinal system: Soft, hypoactive bowel sounds, tenderness to deep and light palpation, no rebound or guarding. Central nervous system: Alert and oriented.  Intact, moving all extremities Extremities: There is lower extremity edema. Skin: No rashes over visible skin Psychiatry: Judgement and insight appear normal. Mood & affect appropriate.     Data Reviewed: I have personally reviewed following labs and imaging studies  CBC: Recent Labs  Lab 06/13/18 1754 06/13/18 2344  WBC 8.3 8.7  NEUTROABS 5.9  --   HGB 11.2* 11.4*  HCT 35.0* 35.8*  MCV 94.3 95.0  PLT 306 431   Basic Metabolic Panel: Recent Labs  Lab 06/13/18 1754 06/13/18 2344  NA 137  --   K 3.4*  --   CL 103  --   CO2 25  --  GLUCOSE 120*  --   BUN 12  --   CREATININE 0.53  --   CALCIUM 8.9  --   MG  --  1.6*   GFR: CrCl cannot be calculated (Unknown ideal weight.). Liver Function Tests: Recent Labs  Lab 06/13/18 1754  AST 17  ALT 13  ALKPHOS 82  BILITOT 0.5  PROT 5.6*  ALBUMIN 2.0*   Recent Labs  Lab 06/13/18 1754  LIPASE 16   No results for input(s): AMMONIA  in the last 168 hours. Coagulation Profile: No results for input(s): INR, PROTIME in the last 168 hours. Cardiac Enzymes: Recent Labs  Lab 06/13/18 2344 06/14/18 0938  TROPONINI <0.03 <0.03   BNP (last 3 results) No results for input(s): PROBNP in the last 8760 hours. HbA1C: No results for input(s): HGBA1C in the last 72 hours. CBG: No results for input(s): GLUCAP in the last 168 hours. Lipid Profile: No results for input(s): CHOL, HDL, LDLCALC, TRIG, CHOLHDL, LDLDIRECT in the last 72 hours. Thyroid Function Tests: No results for input(s): TSH, T4TOTAL, FREET4, T3FREE, THYROIDAB in the last 72 hours. Anemia Panel: No results for input(s): VITAMINB12, FOLATE, FERRITIN, TIBC, IRON, RETICCTPCT in the last 72 hours. Sepsis Labs: Recent Labs  Lab 06/13/18 1738 06/13/18 2344  LATICACIDVEN 1.73 1.6    No results found for this or any previous visit (from the past 240 hour(s)).       Radiology Studies: Ct Abdomen Pelvis Wo Contrast  Result Date: 06/13/2018 CLINICAL DATA:  generalized abdominal pain, greatest over right abdomen, diarrhea for 2-3 weeks. Previous appendectomy, hysterectomy and RIGHT breast lumpectomy. EXAM: CT ABDOMEN AND PELVIS WITHOUT CONTRAST TECHNIQUE: Multidetector CT imaging of the abdomen and pelvis was performed following the standard protocol without IV contrast. COMPARISON:  CT of the abdomen and pelvis on 09/26/2017 FINDINGS: Lower chest: There is subsegmental atelectasis at the lung bases. Coronary artery calcifications are present. Hepatobiliary: There is a stable 11 millimeter probable cyst within the LEFT hepatic lobe. Gallbladder is present. There are dependent partially calcified gallstones without CT evidence for acute cholecystitis. Pancreas: Unremarkable. No pancreatic ductal dilatation or surrounding inflammatory changes. Spleen: Calcified splenic granulomata. Otherwise the spleen is normal in appearance. Adrenals/Urinary Tract: Normal adrenal  glands. Stable cyst within the UPPER pole of the LEFT kidney. There are punctate intrarenal calcifications bilaterally. No hydronephrosis. No ureteral obstruction. The bladder and visualized portion of the urethra are normal. Stomach/Bowel: The stomach and small bowel loops are normal in appearance. There is a large amount of stool particularly within the RIGHT colon. There is dilatation and wall thickening of the sigmoid colon. The wall thickening continues to level of the anus. There are scattered diverticula within this region the appearance favors inflammatory, ischemic, or infectious colitis over acute diverticulitis. Although thickening of the colonic wall throughout the sigmoid colon and rectum has been demonstrated on prior exams, the dilatation of this segment appears new or different. Mesenteric edema is noted in the pelvis. Vascular/Lymphatic: Small mesenteric lymph nodes are identified in the pelvis. No significant retroperitoneal adenopathy. There is significant atherosclerotic calcification of the abdominal aorta not associated aneurysm. Patient has a RIGHT renal artery stent. Reproductive: Hysterectomy.  No adnexal mass. Other: No free pelvic fluid. Anterior abdominal wall is unremarkable. Musculoskeletal: There are degenerative changes in the LOWER thoracic spine and lumbar spine. No suspicious lytic or blastic lesions are identified. Stable small bone islands are identified in the LEFT ilium and RIGHT femoral head. IMPRESSION: 1. Significant stool within the RIGHT aspect of  the colon. 2. Colonic diverticulosis. 3. Dilatation and wall thickening of the sigmoid colon which extends to the rectum. Associated mesenteric edema. Small mesocolon lymph nodes are also present. Findings favor infectious/inflammatory or ischemic colitis over diverticulitis. 4. Nonobstructing nephrolithiasis. 5. Splenic granulomata. 6. Coronary artery disease. 7.  Aortic atherosclerosis.  (ICD10-I70.0) 8. Hysterectomy.  Electronically Signed   By: Nolon Nations M.D.   On: 06/13/2018 20:45        Scheduled Meds: . amLODipine  10 mg Oral Daily  . cholecalciferol  1,000 Units Oral Daily  . mesalamine  2.4 g Oral Q breakfast   Continuous Infusions: . sodium chloride Stopped (06/13/18 2014)     LOS: 0 days    Time spent: [place], MD [facility]  If 7PM-7AM, please contact night-coverage [email or web address] Password [facility] 06/14/2018, 10:49 AM

## 2018-06-14 NOTE — Consult Note (Signed)
Referring Provider: Dr. Ralene Bathe Primary Care Physician:  Rodena Medin, MD (Inactive) Primary Gastroenterologist:  Althia Forts  Reason for Consultation:  Ulcerative Colitis flare; Rectal bleeding  HPI: Felicia Carlson is a 84 y.o. female with reported diagnosis of ulcerative proctosigmoiditis on a colonoscopy at [facility] in 09/2016 by Dr. Marin Comment who she last saw in his office on 06/06/18. Review of their records show that she was initially treated with Apriso and Rowasa but the Apriso was stopped due to the expense. She was put on a trial of Sulfasalazine but it was stopped due to dizzines and then has been on steroid tapers. She was then placed on Lialda 4.8 g/day and Rowasa enemas prn. She was on Prednisone 10 mg/day when she saw them last week. Has been having recurrent episodes of bright red blood per rectum with loose stools and lower abdominal pain that all started about 3 weeks ago. Denies N/V. Hgb 11.2. CT showed sigmoid colon wall thickening and mesenteric edema. C. Diff negative and GI pathogen panel pending. On Xarelto at home.  Past Medical History:  Diagnosis Date  . Arthritis   . Cancer [facility]) 2007   breast cancer right side  . Chest pain   . Chronic kidney disease    stent in kidney right side  . Dyspnea   . GERD (gastroesophageal reflux disease)    sometimes  . Headache    shingles right side of head and right eye  . Hypertension   . Personal history of chemotherapy   . Personal history of radiation therapy     Past Surgical History:  Procedure Laterality Date  . ABDOMINAL HYSTERECTOMY    . APPENDECTOMY    . BREAST LUMPECTOMY Right 2007  . BREAST SURGERY Right 2011   lumpectomy  . EYE SURGERY Bilateral 2015   cataracts  . TONSILLECTOMY     as a child  . TOTAL KNEE ARTHROPLASTY Right 10/26/2014   Procedure: RIGHT TOTAL KNEE ARTHROPLASTY;  Surgeon: [name], MD;  Location: [facility];  Service: Orthopedics;  Laterality: Right;    Prior to Admission medications   Medication  Sig Start Date End Date Taking? Authorizing Provider  amLODipine (NORVASC) 10 MG tablet Take 10 mg by mouth daily.   Yes [provider]  mesalamine (LIALDA) 1.2 g EC tablet Take 2.4 g by mouth daily with breakfast.  06/06/18  Yes [provider]  ALOE VERA PO Take 1 tablet by mouth daily.    [provider]  APPLE CIDER VINEGAR PO Take 1 tablet by mouth daily.    [provider]  Cholecalciferol (VITAMIN D-1000 MAX ST) 1000 units tablet Take 1,000 Units by mouth daily.     [provider]  furosemide (LASIX) 40 MG tablet Take 40 mg by mouth daily.    [provider]  HAWTHORNE BERRY PO Take 1 tablet by mouth daily.    [provider]  meloxicam (MOBIC) 15 MG tablet Take 15 mg by mouth daily.  05/21/18   [provider]  oxyCODONE-acetaminophen (ROXICET) 5-325 MG per tablet Take 1-2 tablets by mouth every 4 (four) hours as needed. Patient not taking: Reported on 01/07/2017 10/28/14   [name], MD  rivaroxaban (XARELTO) 10 MG TABS tablet Take 1 tablet (10 mg total) by mouth daily with breakfast. Patient not taking: Reported on 01/07/2017 10/28/14   [name], MD    Scheduled Meds: . amLODipine  10 mg Oral Daily  . cholecalciferol  1,000 Units Oral  Daily  . mesalamine  2.4 g Oral Q breakfast   Continuous Infusions: . sodium chloride Stopped (06/13/18 2014)   PRN Meds:.sodium chloride, acetaminophen **OR** acetaminophen, HYDROcodone-acetaminophen, ondansetron **OR** ondansetron (ZOFRAN) IV  Allergies as of 06/13/2018 - Review Complete 06/13/2018  Allergen Reaction Noted  . Isovue [iopamidol] Hives and Rash 09/26/2017  . Tetanus immune globulin Swelling 06/01/2014    Family History  Problem Relation Age of Onset  . Breast cancer Sister 54    Social History   Socioeconomic History  . Marital status: Divorced    Spouse name: Not on file  . Number of children: Not on file  . Years of  education: Not on file  . Highest education level: Not on file  Occupational History  . Not on file  Social Needs  . Financial resource strain: Not on file  . Food insecurity:    Worry: Not on file    Inability: Not on file  . Transportation needs:    Medical: Not on file    Non-medical: Not on file  Tobacco Use  . Smoking status: Never Smoker  . Smokeless tobacco: Never Used  Substance and Sexual Activity  . Alcohol use: Yes    Comment: wine occasional  . Drug use: No  . Sexual activity: Not on file  Lifestyle  . Physical activity:    Days per week: Not on file    Minutes per session: Not on file  . Stress: Not on file  Relationships  . Social connections:    Talks on phone: Not on file    Gets together: Not on file    Attends religious service: Not on file    Active member of club or organization: Not on file    Attends meetings of clubs or organizations: Not on file    Relationship status: Not on file  . Intimate partner violence:    Fear of current or ex partner: Not on file    Emotionally abused: Not on file    Physically abused: Not on file    Forced sexual activity: Not on file  Other Topics Concern  . Not on file  Social History Narrative  . Not on file    Review of Systems: All negative except as stated above in HPI.  Physical Exam: Vital signs: Vitals:   06/13/18 2333 06/14/18 0543  BP: 124/60 (!) 142/60  Pulse: 73 85  Resp: 18 18  Temp: 98.7 F (37.1 C) 98.1 F (36.7 C)  SpO2: 95% 96%   Last BM Date: 06/13/18 General:  Elderly, frail, alert, thin, pleasant and cooperative in NAD Head: normocephalic, atraumatic Eyes: anicteric sclera ENT: oropharynx clear Neck: supple, nontender Lungs:  Clear throughout to auscultation.   No wheezes, crackles, or rhonchi. No acute distress. Heart:  Regular rate and rhythm; no murmurs, clicks, rubs,  or gallops. Abdomen: diffuse tenderness with guarding, soft, nondistended, +BS  Rectal:  Deferred Ext: no  edema  GI:  Lab Results: Recent Labs    06/13/18 1754 06/13/18 2344  WBC 8.3 8.7  HGB 11.2* 11.4*  HCT 35.0* 35.8*  PLT 306 301   BMET Recent Labs    06/13/18 1754  NA 137  K 3.4*  CL 103  CO2 25  GLUCOSE 120*  BUN 12  CREATININE 0.53  CALCIUM 8.9   LFT Recent Labs    06/13/18 1754  PROT 5.6*  ALBUMIN 2.0*  AST 17  ALT 13  ALKPHOS 82  BILITOT 0.5   PT/INR  No results for input(s): LABPROT, INR in the last 72 hours.   Studies/Results: Ct Abdomen Pelvis Wo Contrast  Result Date: 06/13/2018 CLINICAL DATA:  generalized abdominal pain, greatest over right abdomen, diarrhea for 2-3 weeks. Previous appendectomy, hysterectomy and RIGHT breast lumpectomy. EXAM: CT ABDOMEN AND PELVIS WITHOUT CONTRAST TECHNIQUE: Multidetector CT imaging of the abdomen and pelvis was performed following the standard protocol without IV contrast. COMPARISON:  CT of the abdomen and pelvis on 09/26/2017 FINDINGS: Lower chest: There is subsegmental atelectasis at the lung bases. Coronary artery calcifications are present. Hepatobiliary: There is a stable 11 millimeter probable cyst within the LEFT hepatic lobe. Gallbladder is present. There are dependent partially calcified gallstones without CT evidence for acute cholecystitis. Pancreas: Unremarkable. No pancreatic ductal dilatation or surrounding inflammatory changes. Spleen: Calcified splenic granulomata. Otherwise the spleen is normal in appearance. Adrenals/Urinary Tract: Normal adrenal glands. Stable cyst within the UPPER pole of the LEFT kidney. There are punctate intrarenal calcifications bilaterally. No hydronephrosis. No ureteral obstruction. The bladder and visualized portion of the urethra are normal. Stomach/Bowel: The stomach and small bowel loops are normal in appearance. There is a large amount of stool particularly within the RIGHT colon. There is dilatation and wall thickening of the sigmoid colon. The wall thickening continues to level  of the anus. There are scattered diverticula within this region the appearance favors inflammatory, ischemic, or infectious colitis over acute diverticulitis. Although thickening of the colonic wall throughout the sigmoid colon and rectum has been demonstrated on prior exams, the dilatation of this segment appears new or different. Mesenteric edema is noted in the pelvis. Vascular/Lymphatic: Small mesenteric lymph nodes are identified in the pelvis. No significant retroperitoneal adenopathy. There is significant atherosclerotic calcification of the abdominal aorta not associated aneurysm. Patient has a RIGHT renal artery stent. Reproductive: Hysterectomy.  No adnexal mass. Other: No free pelvic fluid. Anterior abdominal wall is unremarkable. Musculoskeletal: There are degenerative changes in the LOWER thoracic spine and lumbar spine. No suspicious lytic or blastic lesions are identified. Stable small bone islands are identified in the LEFT ilium and RIGHT femoral head. IMPRESSION: 1. Significant stool within the RIGHT aspect of the colon. 2. Colonic diverticulosis. 3. Dilatation and wall thickening of the sigmoid colon which extends to the rectum. Associated mesenteric edema. Small mesocolon lymph nodes are also present. Findings favor infectious/inflammatory or ischemic colitis over diverticulitis. 4. Nonobstructing nephrolithiasis. 5. Splenic granulomata. 6. Coronary artery disease. 7.  Aortic atherosclerosis.  (ICD10-I70.0) 8. Hysterectomy. Electronically Signed   By: Nolon Nations M.D.   On: 06/13/2018 20:45    Impression/Plan: Ulcerative colitis flare on mesalamine. Start IV steroids since C. Diff negative. GI pathogen panel pending. Clear liquid diet. Supportive care. Will follow.    LOS: 0 days   Lear Ng  06/14/2018, 1:53 PM  Questions please call [phone]

## 2018-06-14 NOTE — Progress Notes (Signed)
Echocardiogram 2D Echocardiogram has been performed.  Felicia Carlson 06/14/2018, 11:14 AM

## 2018-06-15 DIAGNOSIS — K51919 Ulcerative colitis, unspecified with unspecified complications: Secondary | ICD-10-CM | POA: Diagnosis not present

## 2018-06-15 LAB — GASTROINTESTINAL PANEL BY PCR, STOOL (REPLACES STOOL CULTURE)
ASTROVIRUS: NOT DETECTED
Adenovirus F40/41: NOT DETECTED
CYCLOSPORA CAYETANENSIS: NOT DETECTED
Campylobacter species: NOT DETECTED
Cryptosporidium: NOT DETECTED
ENTAMOEBA HISTOLYTICA: NOT DETECTED
Enteroaggregative E coli (EAEC): NOT DETECTED
Enteropathogenic E coli (EPEC): NOT DETECTED
Enterotoxigenic E coli (ETEC): NOT DETECTED
Giardia lamblia: NOT DETECTED
NOROVIRUS GI/GII: NOT DETECTED
Plesimonas shigelloides: NOT DETECTED
Rotavirus A: NOT DETECTED
SALMONELLA SPECIES: NOT DETECTED
SAPOVIRUS (I, II, IV, AND V): NOT DETECTED
Shiga like toxin producing E coli (STEC): NOT DETECTED
Shigella/Enteroinvasive E coli (EIEC): NOT DETECTED
VIBRIO CHOLERAE: NOT DETECTED
VIBRIO SPECIES: NOT DETECTED
Yersinia enterocolitica: NOT DETECTED

## 2018-06-15 LAB — COMPREHENSIVE METABOLIC PANEL
ALBUMIN: 2 g/dL — AB (ref 3.5–5.0)
ALK PHOS: 85 U/L (ref 38–126)
ALT: 12 U/L (ref 0–44)
ANION GAP: 8 (ref 5–15)
AST: 13 U/L — ABNORMAL LOW (ref 15–41)
BUN: 15 mg/dL (ref 8–23)
CALCIUM: 8.5 mg/dL — AB (ref 8.9–10.3)
CO2: 24 mmol/L (ref 22–32)
CREATININE: 0.64 mg/dL (ref 0.44–1.00)
Chloride: 103 mmol/L (ref 98–111)
GFR calc Af Amer: >60 mL/min (ref >60)
GFR calc non Af Amer: >60 mL/min (ref >60)
GLUCOSE: 209 mg/dL — AB (ref 70–99)
Potassium: 4.2 mmol/L (ref 3.5–5.1)
SODIUM: 135 mmol/L (ref 135–145)
Total Bilirubin: 0.6 mg/dL (ref 0.3–1.2)
Total Protein: 5.4 g/dL — ABNORMAL LOW (ref 6.5–8.1)

## 2018-06-15 LAB — CBC
HCT: 34.4 % — ABNORMAL LOW (ref 36.0–46.0)
Hemoglobin: 10.9 g/dL — ABNORMAL LOW (ref 12.0–15.0)
MCH: 29.8 pg (ref 26.0–34.0)
MCHC: 31.7 g/dL (ref 30.0–36.0)
MCV: 94 fL (ref 80.0–100.0)
Platelets: 310 K/uL (ref 150–400)
RBC: 3.66 MIL/uL — ABNORMAL LOW (ref 3.87–5.11)
RDW: 12.8 % (ref 11.5–15.5)
WBC: 8.8 10*3/uL (ref 4.0–10.5)
nRBC: 0 % (ref 0.0–0.2)

## 2018-06-15 LAB — PHOSPHORUS: Phosphorus: 3 mg/dL (ref 2.5–4.6)

## 2018-06-15 LAB — MAGNESIUM: MAGNESIUM: 1.7 mg/dL (ref 1.7–2.4)

## 2018-06-15 LAB — PREALBUMIN: Prealbumin: <5 mg/dL — ABNORMAL LOW (ref 18–38)

## 2018-06-15 LAB — C-REACTIVE PROTEIN: CRP: 21.7 mg/dL — ABNORMAL HIGH (ref <1.0)

## 2018-06-15 LAB — TSH: TSH: 0.331 u[IU]/mL — ABNORMAL LOW (ref 0.350–4.500)

## 2018-06-15 LAB — T4, FREE: Free T4: 1.18 ng/dL (ref 0.82–1.77)

## 2018-06-15 MED ORDER — STERILE WATER FOR INJECTION IJ SOLN
INTRAMUSCULAR | Status: AC
  Administered 2018-06-15: 14:00:00
  Filled 2018-06-15: qty 10

## 2018-06-15 MED ORDER — HYDROCORTISONE 2.5 % RE CREA
TOPICAL_CREAM | Freq: Three times a day (TID) | RECTAL | Status: DC
  Administered 2018-06-15 – 2018-06-19 (×14): via RECTAL
  Filled 2018-06-15: qty 28.35

## 2018-06-15 MED ORDER — ALTEPLASE 2 MG IJ SOLR
2.0000 mg | Freq: Once | INTRAMUSCULAR | Status: AC
  Administered 2018-06-15: 2 mg
  Filled 2018-06-15: qty 2

## 2018-06-15 MED ORDER — METOPROLOL TARTRATE 25 MG PO TABS
12.5000 mg | ORAL_TABLET | Freq: Four times a day (QID) | ORAL | Status: DC
  Administered 2018-06-15 – 2018-06-16 (×5): 12.5 mg via ORAL
  Filled 2018-06-15 (×5): qty 1

## 2018-06-15 MED ORDER — METOPROLOL TARTRATE 5 MG/5ML IV SOLN
5.0000 mg | INTRAVENOUS | Status: AC | PRN
  Administered 2018-06-15 (×2): 5 mg via INTRAVENOUS
  Filled 2018-06-15: qty 5

## 2018-06-15 MED ORDER — BOOST / RESOURCE BREEZE PO LIQD CUSTOM
1.0000 | Freq: Three times a day (TID) | ORAL | Status: DC
  Administered 2018-06-15 – 2018-06-19 (×12): 1 via ORAL

## 2018-06-15 NOTE — Progress Notes (Signed)
Pt with hemorrhoid noted outside of rectum after using the [facility], MD made aware. New order given.

## 2018-06-15 NOTE — Progress Notes (Signed)
[facility] Gastroenterology Progress Note  Felicia Carlson 84 y.o. [birth year]   Subjective: Bloody diarrhea overnight. Complains of abdominal pain.  Objective: Vital signs: Vitals:   06/14/18 2133 06/15/18 0539  BP: 114/61 (!) 141/103  Pulse: 76 (!) 159  Resp: 18 18  Temp: 97.7 F (36.5 C) (!) 97.5 F (36.4 C)  SpO2: 95% 99%    Physical Exam: Gen: lethargic, elderly, frail, no acute distress  HEENT: anicteric sclera CV: RRR Chest: CTA B Abd: diffuse tenderness with minimal guarding, soft, nondistended, +BS Ext: no edema  Lab Results: Recent Labs    06/13/18 1754 06/13/18 2344 06/15/18 0357  NA 137  --  135  K 3.4*  --  4.2  CL 103  --  103  CO2 25  --  24  GLUCOSE 120*  --  209*  BUN 12  --  15  CREATININE 0.53  --  0.64  CALCIUM 8.9  --  8.5*  MG  --  1.6* 1.7  PHOS  --   --  3.0   Recent Labs    06/13/18 1754 06/15/18 0357  AST 17 13*  ALT 13 12  ALKPHOS 82 85  BILITOT 0.5 0.6  PROT 5.6* 5.4*  ALBUMIN 2.0* 2.0*   Recent Labs    06/13/18 1754 06/13/18 2344 06/15/18 0357  WBC 8.3 8.7 8.8  NEUTROABS 5.9  --   --   HGB 11.2* 11.4* 10.9*  HCT 35.0* 35.8* 34.4*  MCV 94.3 95.0 94.0  PLT 306 301 310      Assessment/Plan: Ulcerative colitis flare started on IV steroids yesterday (Solumedrol 60 mg IV Q 12 hours). C. Diff negative. GI pathogen panel pending. Clear liquid diet. Continue supportive care. If symptoms persist, then may need an updated inpt colonoscopy to further evaluate. [facility] will f/u tomorrow.   Felicia Carlson 06/15/2018, 10:14 AM  Questions please call [phone] ID: Felicia Carlson, female   DOB: 1924/12/29, 84 y.o.   MRN: [id number]

## 2018-06-15 NOTE — Care Management Obs Status (Signed)
[place] NOTIFICATION   Patient Details  Name: Felicia Carlson MRN: [id number] Date of Birth: 06/15/1925   Medicare Observation Status Notification Given:  Yes    MahabirJuliann Pulse, RN 06/15/2018, 4:06 PM

## 2018-06-15 NOTE — Progress Notes (Signed)
Initial Nutrition Assessment  INTERVENTION:   Provide Boost Breeze po TID, each supplement provides 250 kcal and 9 grams of protein  NUTRITION DIAGNOSIS:   Inadequate oral intake related to (abdominal pain) as evidenced by per patient/family report.  GOAL:   Patient will meet greater than or equal to 90% of their needs  MONITOR:   PO intake, Supplement acceptance, Diet advancement, Weight trends, Labs, I & O's  REASON FOR ASSESSMENT:   Consult Assessment of nutrition requirement/status  ASSESSMENT:   84 year old woman with past medical history relevant for ulcerative colitis followed at [facility], CKD unknown stage, hypertension, breast cancer status post chemoradiation in remission admitted with abdominal pain, hematochezia and CT findings concerning for rectosigmoid colitis concerning for UC flare.   Patient reports abdominal pain for 3 weeks PTA which has impacted her PO intakes. Pt currently on clear liquids d/t GI bleed. GI following. Pt is c.diff negative. Pt consuming some clears. Will order Boost Breeze supplements.   No recent weights in weight history. Last weight recorded is from 2016. Noted that weight was documented as 136 lb on 10/18 from a gastroenterology visit at [facility]. In April 2019, pt's weight documented as 141 lb. Weight is trending down but not significant loss.  Labs reviewed. Medications: Vitamin D tablet daily  NUTRITION - FOCUSED PHYSICAL EXAM:  Depletions may be more related to advanced age.  Diet Order:   Diet Order            Diet clear liquid Room service appropriate? Yes; Fluid consistency: Thin  Diet effective now              EDUCATION NEEDS:   No education needs have been identified at this time  Skin:  Skin Assessment: Reviewed RN Assessment  Last BM:  10/27  Height:   Ht Readings from Last 1 Encounters:  06/15/18 5\' 1"  (1.549 m)    Weight:   Wt Readings from Last 1 Encounters:  10/26/14 74.8 kg    Ideal  Body Weight:  47.7 kg  BMI:  Body mass index is 31.18 kg/m.  Estimated Nutritional Needs:   Kcal:  unable to calculate  Protein:     Fluid:     Clayton Bibles, MS, RD, LDN [place] Diet[facility] Pager: [phone] After Hours Pager: [phone]

## 2018-06-15 NOTE — Progress Notes (Addendum)
PROGRESS NOTE    Felicia Carlson  EZM:[id number] DOB: 24-Feb-1925 DOA: 06/13/2018 PCP: Rodena Medin, MD (Inactive)    Brief Narrative:  84 year old woman with past medical history relevant for ulcerative colitis followed at [facility], CKD unknown stage, hypertension, breast cancer status post chemoradiation in remission admitted with abdominal pain, hematochezia and CT findings concerning for rectosigmoid colitis concerning for UC flare.  Her hospital course has been comp gated by runs of atrial fibrillation with RVR.   Assessment & Plan:   Active Problems:   Lower GI bleed   Ulcerative colitis with complication (HCC)   Chest pain   Debility   Hypokalemia   Cardiac murmur   #) Abdominal pain/hematochezia/rectosigmoid colitis: At this time this seems most consistent with ulcerative colitis flare as her inflammatory markers are quite elevated and her infectious work-up was negative .-Continue IV methylprednisone 60 mg every 12 -Fecal calprotectin ordered - Gastroenterology consult [facility] -C. difficile negative, GI panel pending -Continue mesalamine 2.4 g daily -Clear liquid diet - IV fluids  #) Paroxysmal atrial fibrillation: Patient has had runs of paroxysmal atrial fibrillation with rapid ventricular response.  She was on rivaroxaban but she cannot tell me why she was on this medication or for what she takes it. -Echo on 10 26,019 shows only grade 1 diastolic dysfunction E -start metoprolol tartrate 12.5 mg every 6 hours -Telemetry  -we will address with the patient about anticoagulation however she continues to have hematochezia and so we will hold on anticoagulation at this time  #) CKD: Patient carries a diagnosis however her creatinine here is normal.  It might be underestimating due to her frailty however it is not clear that the patient does have CKD.  #) Grade 1 diastolic dysfunction: Currently patient is mildly hypovolemic  #) Hypertension: -Continue  amlodipine 10 mg daily  Fluids: Gentle IV fluids Electrodes: Monitor and supplement Nutrition: Clear liquid diet  Prophylaxis: SCDs  Disposition: Pending resolution of hematochezia  DO NOT RESUSCITATE   Consultants:   Gastroenterology  Procedures:  06/14/2018 echo: - Left ventricle: The cavity size was normal. Wall thickness was   increased in a pattern of mild LVH. Systolic function was   vigorous. The estimated ejection fraction was in the range of 65%   to 70%. Wall motion was normal; there were no regional wall   motion abnormalities. Doppler parameters are consistent with   abnormal left ventricular relaxation (grade 1 diastolic   dysfunction). - Aortic valve: There was mild regurgitation. Valve area (VTI):   2.62 cm^2. Valve area (Vmax): 2.38 cm^2. Valve area (Vmean): 2.55   cm^2. - Pulmonary arteries: Systolic pressure was moderately increased.    PA peak pressure: 46 mm Hg (S).  Antimicrobials:   None   Subjective: Patient reports she continues to have abdominal pain and tenesmus as well as hematochezia.  She has not improved much.  She is eating somewhat better.  She was started on steroids by gastroenterology due to concern for ulcerative colitis flare.  She was also noted to have runs of atrial fibrillation with RVR that was asymptomatic.  She cannot tell me if she is ever carried this diagnosis before.  Objective: Vitals:   06/14/18 1411 06/14/18 1811 06/14/18 2133 06/15/18 0539  BP: 136/69 105/64 114/61 (!) 141/103  Pulse: 91 75 76 (!) 159  Resp: 16 18 18 18   Temp: 99.1 F (37.3 C)  97.7 F (36.5 C) (!) 97.5 F (36.4 C)  TempSrc: Oral  Oral Oral  SpO2: 94% 97% 95% 99%    Intake/Output Summary (Last 24 hours) at 06/15/2018 0947 Last data filed at 06/15/2018 0550 Gross per 24 hour  Intake 365.17 ml  Output 89 ml  Net 276.17 ml   There were no vitals filed for this visit.  Examination:  General exam: Younger than stated age, lying in bed,  in mild distress Respiratory system: Clear to auscultation. Respiratory effort normal. Cardiovascular system: Irregularly irregular, no murmurs, tachycardic Gastrointestinal system: Soft, hypoactive bowel sounds, tenderness to deep and light palpation, no rebound or guarding. Central nervous system: Alert and oriented.  Intact, moving all extremities Extremities: There is lower extremity edema. Skin: No rashes over visible skin Psychiatry: Judgement and insight appear normal. Mood & affect appropriate.     Data Reviewed: I have personally reviewed following labs and imaging studies  CBC: Recent Labs  Lab 06/13/18 1754 06/13/18 2344 06/15/18 0357  WBC 8.3 8.7 8.8  NEUTROABS 5.9  --   --   HGB 11.2* 11.4* 10.9*  HCT 35.0* 35.8* 34.4*  MCV 94.3 95.0 94.0  PLT 306 301 250   Basic Metabolic Panel: Recent Labs  Lab 06/13/18 1754 06/13/18 2344 06/15/18 0357  NA 137  --  135  K 3.4*  --  4.2  CL 103  --  103  CO2 25  --  24  GLUCOSE 120*  --  209*  BUN 12  --  15  CREATININE 0.53  --  0.64  CALCIUM 8.9  --  8.5*  MG  --  1.6* 1.7  PHOS  --   --  3.0   GFR: CrCl cannot be calculated (Unknown ideal weight.). Liver Function Tests: Recent Labs  Lab 06/13/18 1754 06/15/18 0357  AST 17 13*  ALT 13 12  ALKPHOS 82 85  BILITOT 0.5 0.6  PROT 5.6* 5.4*  ALBUMIN 2.0* 2.0*   Recent Labs  Lab 06/13/18 1754  LIPASE 16   No results for input(s): AMMONIA in the last 168 hours. Coagulation Profile: No results for input(s): INR, PROTIME in the last 168 hours. Cardiac Enzymes: Recent Labs  Lab 06/13/18 2344 06/14/18 0938 06/14/18 1541  TROPONINI <0.03 <0.03 <0.03   BNP (last 3 results) No results for input(s): PROBNP in the last 8760 hours. HbA1C: No results for input(s): HGBA1C in the last 72 hours. CBG: No results for input(s): GLUCAP in the last 168 hours. Lipid Profile: No results for input(s): CHOL, HDL, LDLCALC, TRIG, CHOLHDL, LDLDIRECT in the last 72  hours. Thyroid Function Tests: Recent Labs    06/15/18 0357  TSH 0.331*   Anemia Panel: No results for input(s): VITAMINB12, FOLATE, FERRITIN, TIBC, IRON, RETICCTPCT in the last 72 hours. Sepsis Labs: Recent Labs  Lab 06/13/18 1738 06/13/18 2344  LATICACIDVEN 1.73 1.6    Recent Results (from the past 240 hour(s))  C difficile quick scan w PCR reflex     Status: None   Collection Time: 06/14/18 11:23 AM  Result Value Ref Range Status   C Diff antigen NEGATIVE NEGATIVE Final   C Diff toxin NEGATIVE NEGATIVE Final   C Diff interpretation No C. difficile detected.  Final    Comment: Performed at [facility], [place] [address] [place], [place] [address]         Radiology Studies: Ct Abdomen Pelvis Wo Contrast  Result Date: 06/13/2018 CLINICAL DATA:  generalized abdominal pain, greatest over right abdomen, diarrhea for 2-3 weeks. Previous appendectomy, hysterectomy and RIGHT breast lumpectomy. EXAM: CT ABDOMEN  AND PELVIS WITHOUT CONTRAST TECHNIQUE: Multidetector CT imaging of the abdomen and pelvis was performed following the standard protocol without IV contrast. COMPARISON:  CT of the abdomen and pelvis on 09/26/2017 FINDINGS: Lower chest: There is subsegmental atelectasis at the lung bases. Coronary artery calcifications are present. Hepatobiliary: There is a stable 11 millimeter probable cyst within the LEFT hepatic lobe. Gallbladder is present. There are dependent partially calcified gallstones without CT evidence for acute cholecystitis. Pancreas: Unremarkable. No pancreatic ductal dilatation or surrounding inflammatory changes. Spleen: Calcified splenic granulomata. Otherwise the spleen is normal in appearance. Adrenals/Urinary Tract: Normal adrenal glands. Stable cyst within the UPPER pole of the LEFT kidney. There are punctate intrarenal calcifications bilaterally. No hydronephrosis. No ureteral obstruction. The bladder and visualized portion of the urethra are  normal. Stomach/Bowel: The stomach and small bowel loops are normal in appearance. There is a large amount of stool particularly within the RIGHT colon. There is dilatation and wall thickening of the sigmoid colon. The wall thickening continues to level of the anus. There are scattered diverticula within this region the appearance favors inflammatory, ischemic, or infectious colitis over acute diverticulitis. Although thickening of the colonic wall throughout the sigmoid colon and rectum has been demonstrated on prior exams, the dilatation of this segment appears new or different. Mesenteric edema is noted in the pelvis. Vascular/Lymphatic: Small mesenteric lymph nodes are identified in the pelvis. No significant retroperitoneal adenopathy. There is significant atherosclerotic calcification of the abdominal aorta not associated aneurysm. Patient has a RIGHT renal artery stent. Reproductive: Hysterectomy.  No adnexal mass. Other: No free pelvic fluid. Anterior abdominal wall is unremarkable. Musculoskeletal: There are degenerative changes in the LOWER thoracic spine and lumbar spine. No suspicious lytic or blastic lesions are identified. Stable small bone islands are identified in the LEFT ilium and RIGHT femoral head. IMPRESSION: 1. Significant stool within the RIGHT aspect of the colon. 2. Colonic diverticulosis. 3. Dilatation and wall thickening of the sigmoid colon which extends to the rectum. Associated mesenteric edema. Small mesocolon lymph nodes are also present. Findings favor infectious/inflammatory or ischemic colitis over diverticulitis. 4. Nonobstructing nephrolithiasis. 5. Splenic granulomata. 6. Coronary artery disease. 7.  Aortic atherosclerosis.  (ICD10-I70.0) 8. Hysterectomy. Electronically Signed   By: Nolon Nations M.D.   On: 06/13/2018 20:45   [place] Ekg Site Rite  Result Date: 06/14/2018 If Site Rite image not attached, placement could not be confirmed due to current cardiac  rhythm.       Scheduled Meds: . alteplase  2 mg Intracatheter Once  . amLODipine  10 mg Oral Daily  . cholecalciferol  1,000 Units Oral Daily  . mesalamine  2.4 g Oral Q breakfast  . methylPREDNISolone (SOLU-MEDROL) injection  60 mg Intravenous Q12H  . metoprolol tartrate  12.5 mg Oral Q6H  . sodium chloride flush  10-40 mL Intracatheter Q12H   Continuous Infusions: . sodium chloride 10 mL/hr at 06/14/18 1729     LOS: 0 days    Time spent: [place], MD [facility]  If 7PM-7AM, please contact night-coverage [email or web address] Password [facility] 06/15/2018, 9:47 AM

## 2018-06-16 DIAGNOSIS — K625 Hemorrhage of anus and rectum: Secondary | ICD-10-CM | POA: Diagnosis present

## 2018-06-16 DIAGNOSIS — Z923 Personal history of irradiation: Secondary | ICD-10-CM | POA: Diagnosis not present

## 2018-06-16 DIAGNOSIS — I480 Paroxysmal atrial fibrillation: Secondary | ICD-10-CM | POA: Diagnosis present

## 2018-06-16 DIAGNOSIS — Z66 Do not resuscitate: Secondary | ICD-10-CM | POA: Diagnosis present

## 2018-06-16 DIAGNOSIS — Z9221 Personal history of antineoplastic chemotherapy: Secondary | ICD-10-CM | POA: Diagnosis not present

## 2018-06-16 DIAGNOSIS — K219 Gastro-esophageal reflux disease without esophagitis: Secondary | ICD-10-CM | POA: Diagnosis present

## 2018-06-16 DIAGNOSIS — I2510 Atherosclerotic heart disease of native coronary artery without angina pectoris: Secondary | ICD-10-CM | POA: Diagnosis present

## 2018-06-16 DIAGNOSIS — Z853 Personal history of malignant neoplasm of breast: Secondary | ICD-10-CM | POA: Diagnosis not present

## 2018-06-16 DIAGNOSIS — E861 Hypovolemia: Secondary | ICD-10-CM | POA: Diagnosis present

## 2018-06-16 DIAGNOSIS — Z90710 Acquired absence of both cervix and uterus: Secondary | ICD-10-CM | POA: Diagnosis not present

## 2018-06-16 DIAGNOSIS — K922 Gastrointestinal hemorrhage, unspecified: Secondary | ICD-10-CM | POA: Diagnosis not present

## 2018-06-16 DIAGNOSIS — N189 Chronic kidney disease, unspecified: Secondary | ICD-10-CM | POA: Diagnosis present

## 2018-06-16 DIAGNOSIS — E669 Obesity, unspecified: Secondary | ICD-10-CM | POA: Diagnosis present

## 2018-06-16 DIAGNOSIS — Z96651 Presence of right artificial knee joint: Secondary | ICD-10-CM | POA: Diagnosis present

## 2018-06-16 DIAGNOSIS — Z6830 Body mass index (BMI) 30.0-30.9, adult: Secondary | ICD-10-CM | POA: Diagnosis not present

## 2018-06-16 DIAGNOSIS — E871 Hypo-osmolality and hyponatremia: Secondary | ICD-10-CM | POA: Diagnosis present

## 2018-06-16 DIAGNOSIS — I129 Hypertensive chronic kidney disease with stage 1 through stage 4 chronic kidney disease, or unspecified chronic kidney disease: Secondary | ICD-10-CM | POA: Diagnosis present

## 2018-06-16 DIAGNOSIS — E876 Hypokalemia: Secondary | ICD-10-CM | POA: Diagnosis present

## 2018-06-16 DIAGNOSIS — K51919 Ulcerative colitis, unspecified with unspecified complications: Secondary | ICD-10-CM | POA: Diagnosis not present

## 2018-06-16 DIAGNOSIS — K51811 Other ulcerative colitis with rectal bleeding: Secondary | ICD-10-CM | POA: Diagnosis present

## 2018-06-16 LAB — BASIC METABOLIC PANEL
BUN: 25 mg/dL — ABNORMAL HIGH (ref 8–23)
Calcium: 8.7 mg/dL — ABNORMAL LOW (ref 8.9–10.3)
Creatinine, Ser: 0.97 mg/dL (ref 0.44–1.00)
GFR calc non Af Amer: 50 mL/min — ABNORMAL LOW (ref >60)
Potassium: 4.9 mmol/L (ref 3.5–5.1)
Sodium: 128 mmol/L — ABNORMAL LOW (ref 135–145)

## 2018-06-16 LAB — BASIC METABOLIC PANEL WITH GFR
Anion gap: 7 (ref 5–15)
CO2: 24 mmol/L (ref 22–32)
Chloride: 97 mmol/L — ABNORMAL LOW (ref 98–111)
GFR calc Af Amer: 58 mL/min — ABNORMAL LOW (ref >60)
Glucose, Bld: 295 mg/dL — ABNORMAL HIGH (ref 70–99)

## 2018-06-16 LAB — CBC
HCT: 31.9 % — ABNORMAL LOW (ref 36.0–46.0)
Hemoglobin: 10.1 g/dL — ABNORMAL LOW (ref 12.0–15.0)
MCH: 30 pg (ref 26.0–34.0)
MCHC: 31.7 g/dL (ref 30.0–36.0)
MCV: 94.7 fL (ref 80.0–100.0)
Platelets: 292 10*3/uL (ref 150–400)
RBC: 3.37 MIL/uL — ABNORMAL LOW (ref 3.87–5.11)
RDW: 12.7 % (ref 11.5–15.5)
WBC: 9.2 K/uL (ref 4.0–10.5)
nRBC: 0 % (ref 0.0–0.2)

## 2018-06-16 LAB — GLUCOSE, CAPILLARY
GLUCOSE-CAPILLARY: 205 mg/dL — AB (ref 70–99)
GLUCOSE-CAPILLARY: 276 mg/dL — AB (ref 70–99)
Glucose-Capillary: 247 mg/dL — ABNORMAL HIGH (ref 70–99)
Glucose-Capillary: 168 mg/dL — ABNORMAL HIGH (ref 70–99)

## 2018-06-16 LAB — T3, FREE: T3, Free: 1.4 pg/mL — ABNORMAL LOW (ref 2.0–4.4)

## 2018-06-16 LAB — CALPROTECTIN, FECAL: Calprotectin, Fecal: 1239 ug/g — ABNORMAL HIGH (ref 0–120)

## 2018-06-16 MED ORDER — METOPROLOL TARTRATE 25 MG PO TABS
25.0000 mg | ORAL_TABLET | Freq: Four times a day (QID) | ORAL | Status: DC
  Administered 2018-06-16 – 2018-06-17 (×4): 25 mg via ORAL
  Filled 2018-06-16 (×4): qty 1

## 2018-06-16 MED ORDER — INSULIN ASPART 100 UNIT/ML ~~LOC~~ SOLN
0.0000 [IU] | Freq: Three times a day (TID) | SUBCUTANEOUS | Status: DC
  Administered 2018-06-16: 5 [IU] via SUBCUTANEOUS
  Administered 2018-06-16 (×2): 3 [IU] via SUBCUTANEOUS
  Administered 2018-06-17: 1 [IU] via SUBCUTANEOUS
  Administered 2018-06-17: 2 [IU] via SUBCUTANEOUS
  Administered 2018-06-17: 5 [IU] via SUBCUTANEOUS
  Administered 2018-06-18: 2 [IU] via SUBCUTANEOUS
  Administered 2018-06-18 (×2): 3 [IU] via SUBCUTANEOUS
  Administered 2018-06-19: 7 [IU] via SUBCUTANEOUS
  Administered 2018-06-19 – 2018-06-20 (×3): 2 [IU] via SUBCUTANEOUS
  Administered 2018-06-20: 1 [IU] via SUBCUTANEOUS

## 2018-06-16 MED ORDER — SODIUM CHLORIDE 0.9 % IV SOLN
INTRAVENOUS | Status: AC
  Administered 2018-06-16: 10:00:00 via INTRAVENOUS

## 2018-06-16 MED ORDER — INSULIN ASPART 100 UNIT/ML ~~LOC~~ SOLN
0.0000 [IU] | Freq: Every day | SUBCUTANEOUS | Status: DC
  Administered 2018-06-17: 2 [IU] via SUBCUTANEOUS
  Administered 2018-06-18: 3 [IU] via SUBCUTANEOUS

## 2018-06-16 MED ORDER — DILTIAZEM HCL 25 MG/5ML IV SOLN
10.0000 mg | Freq: Once | INTRAVENOUS | Status: AC
  Administered 2018-06-16: 10 mg via INTRAVENOUS
  Filled 2018-06-16: qty 5

## 2018-06-16 NOTE — Evaluation (Signed)
Occupational Therapy Evaluation Patient Details Name: Felicia Carlson MRN: [id number] DOB: 1924/11/23 Today's Date: 06/16/2018    History of Present Illness 84 yo female admitted with LGIB, chest pain, abnormal ECG. hx of breast ca, UC, CKD   Clinical Impression  Pt admitted with LGIB. Pt currently with functional limitations due to the deficits listed below (see OT Problem List). Pt will benefit from skilled OT to increase their safety and independence with ADL and functional mobility for ADL to facilitate discharge to venue listed below.     Follow Up Recommendations  Home health OT;Supervision/Assistance - 24 hour    Equipment Recommendations  None recommended by OT       Precautions / Restrictions Precautions Precautions: Fall Precaution Comments: bloody drainage from rectum Restrictions Weight Bearing Restrictions: No      Mobility Bed Mobility Overal bed mobility: Needs Assistance Bed Mobility: Supine to Sit     Supine to sit: Min guard;HOB elevated Sit to supine: Min guard;HOB elevated   General bed mobility comments: Increased time.   Transfers Overall transfer level: Needs assistance Equipment used: Rolling walker (2 wheeled)(IV pole) Transfers: Sit to/from [facility] Sit to Stand: Min assist Stand pivot transfers: Min assist       General transfer comment: Assist to rise, stabilize, control descent. Pt required 2 hand support on IV pole for balance/assist. Increased time.     Balance Overall balance assessment: Needs assistance   Sitting balance-Leahy Scale: Good     Standing balance support: Bilateral upper extremity supported Standing balance-Leahy Scale: Poor                             ADL either performed or assessed with clinical judgement   ADL Overall ADL's : Needs assistance/impaired Eating/Feeding: Set up;Sitting   Grooming: Set up;Sitting   Upper Body Bathing: Set up;Sitting   Lower Body Bathing:  Moderate assistance;Sit to/from stand;Cueing for sequencing;Cueing for safety   Upper Body Dressing : Set up;Sitting   Lower Body Dressing: Moderate assistance;Sit to/from stand;Cueing for sequencing;Cueing for safety   Toilet Transfer: Minimal assistance;RW;Cueing for sequencing;Cueing for safety;Stand-pivot;BSC   Toileting- Clothing Manipulation and Hygiene: Minimal assistance;Cueing for safety;Cueing for sequencing               Vision Baseline Vision/History: Wears glasses Wears Glasses: Reading only Patient Visual Report: No change from baseline              Pertinent Vitals/Pain Pain Assessment: No/denies pain Pain Score: 7  Pain Location: abdomen, back Pain Descriptors / Indicators: Aching;Discomfort Pain Intervention(s): Limited activity within patient's tolerance;Repositioned;Patient requesting pain meds-RN notified           Communication Communication Communication: No difficulties   Cognition Arousal/Alertness: Awake/alert Behavior During Therapy: WFL for tasks assessed/performed Overall Cognitive Status: Within Functional Limits for tasks assessed                                                Home Living Family/patient expects to be discharged to:: Private residence Living Arrangements: Alone Available Help at Discharge: Family;Available PRN/intermittently(daughter n law helping/staying with pt recently) Type of Home: House Home Access: Level entry     Home Layout: One level     Bathroom Shower/Tub: Occupational psychologist: Standard     Home Equipment:  Walker - 2 wheels;Cane - single point          Prior Functioning/Environment Level of Independence: Needs assistance  Gait / Transfers Assistance Needed: uses RW vs cane ADL's / Homemaking Assistance Needed: daughter n law assists as needed            OT Problem List: Decreased strength;Decreased activity tolerance;Impaired balance (sitting and/or  standing);Decreased knowledge of use of DME or AE      OT Treatment/Interventions: Self-care/ADL training;Patient/family education;DME and/or AE instruction    OT Goals(Current goals can be found in the care plan section) Acute Rehab OT Goals Patient Stated Goal: home with kids to A OT Goal Formulation: With patient Time For Goal Achievement: 06/30/18 Potential to Achieve Goals: Good ADL Goals Pt Will Perform Grooming: with supervision;standing Pt Will Perform Lower Body Dressing: with supervision;sit to/from stand Pt Will Transfer to Toilet: with supervision;regular height toilet;ambulating  OT Frequency: Min 2X/week              AM-PAC PT "6 Clicks" Daily Activity     Outcome Measure Help from another person eating meals?: None Help from another person taking care of personal grooming?: A Little Help from another person toileting, which includes using toliet, bedpan, or urinal?: A Little Help from another person bathing (including washing, rinsing, drying)?: A Little Help from another person to put on and taking off regular upper body clothing?: A Little Help from another person to put on and taking off regular lower body clothing?: A Lot 6 Click Score: 18   End of Session Equipment Utilized During Treatment: Rolling walker Nurse Communication: Mobility status  Activity Tolerance: Patient tolerated treatment well Patient left: in chair;with call bell/phone within reach;with chair alarm set  OT Visit Diagnosis: Unsteadiness on feet (R26.81);Other abnormalities of gait and mobility (R26.89);Pain Pain - part of body: (stomach)                Time: 1126-1150 OT Time Calculation (min): 24 min Charges:  OT General Charges $OT Visit: 1 Visit OT Evaluation $OT Eval Moderate Complexity: 1 Mod OT Treatments $Self Care/Home Management : 8-22 mins  Kari Baars, OT Acute Rehabilitation Services [phone] Office- [phone], Edwena Felty D 06/16/2018,  4:22 PM

## 2018-06-16 NOTE — Progress Notes (Signed)
PROGRESS NOTE    Felicia GALLOP  TSV:[id number] DOB: Oct 04, 1924 DOA: 06/13/2018 PCP: Rodena Medin, MD (Inactive)    Brief Narrative:  84 year old woman with past medical history relevant for ulcerative colitis followed at [facility], CKD unknown stage, hypertension, breast cancer status post chemoradiation in remission admitted with abdominal pain, hematochezia and CT findings concerning for rectosigmoid colitis concerning for UC flare.  Her hospital course has been comp gated by runs of atrial fibrillation with RVR.   Assessment & Plan:   Active Problems:   Lower GI bleed   Ulcerative colitis with complication (HCC)   Chest pain   Debility   Hypokalemia   Cardiac murmur   #) Ulcerative colitis flare: Improving with steroids and advancing diet .-Continue IV methylprednisone 60 mg every 12 -Fecal calprotectin ordered - Gastroenterology consult [facility] -C. difficile negative, GI panel negative -Continue mesalamine 2.4 g daily -Advance to full liquid diet - IV fluids  #) Paroxysmal atrial fibrillation: Continues to have runs of RVR particularly when exerting herself.  Her heart rate is better controlled on metoprolol.  She did however require a one-time dose of diltiazem yesterday. -Echo on 06/14/2018 shows only grade 1 diastolic dysfunction E - increase metoprolol tartrate to 25 mg every 6 hours -Telemetry  -Patient has rivaroxaban on her medication list though she cannot remember if she is still taking this.  Regardless she is just stopped having bloody diarrhea  #) CKD: Patient carries a diagnosis however her creatinine here is normal.  It might be underestimating due to her frailty however it is not clear that the patient does have CKD.  #) Grade 1 diastolic dysfunction: Euvolemic  #) Hypertension: -Continue amlodipine 10 mg daily -Continue beta-blocker per above  Fluids: Gentle IV fluids Electrodes: Monitor and supplement Nutrition: Full liquid  diet  Prophylaxis: SCDs  Disposition: Pending resolution of hematochezia  DO NOT RESUSCITATE   Consultants:   Gastroenterology  Procedures:  06/14/2018 echo: - Left ventricle: The cavity size was normal. Wall thickness was   increased in a pattern of mild LVH. Systolic function was   vigorous. The estimated ejection fraction was in the range of 65%   to 70%. Wall motion was normal; there were no regional wall   motion abnormalities. Doppler parameters are consistent with   abnormal left ventricular relaxation (grade 1 diastolic   dysfunction). - Aortic valve: There was mild regurgitation. Valve area (VTI):   2.62 cm^2. Valve area (Vmax): 2.38 cm^2. Valve area (Vmean): 2.55   cm^2. - Pulmonary arteries: Systolic pressure was moderately increased.    PA peak pressure: 46 mm Hg (S).  Antimicrobials:   None   Subjective: Patient reports that her bloody diarrhea has almost resolved.  Her abdominal pain is dramatically improved except for some left and right lower quadrant abdominal pain.  She has not had any further emesis.  She is tolerating her clear liquid diet and is requesting it be advanced.  She denies any nausea, vomiting, fevers, cough, congestion, chest pain, rhinorrhea.  She has not had any palpitations though she does have runs of RVR when she exerts herself such as going to the bathroom.  Objective: Vitals:   06/16/18 0154 06/16/18 0502 06/16/18 0516 06/16/18 0524  BP: 114/77 (!) 85/44 132/86 117/81  Pulse: (!) 129 (!) 103 (!) 101   Resp: 18 16  12   Temp: 97.9 F (36.6 C) 97.9 F (36.6 C)  97.8 F (36.6 C)  TempSrc: Oral Oral  Oral  SpO2:  96% 95%  99%  Height:        Intake/Output Summary (Last 24 hours) at 06/16/2018 1036 Last data filed at 06/15/2018 1200 Gross per 24 hour  Intake 360 ml  Output -  Net 360 ml   There were no vitals filed for this visit.  Examination:  General exam: Younger than stated age, lying in bed, no acute  distress Respiratory system: Clear to auscultation. Respiratory effort normal. Cardiovascular system: Irregularly irregular, no murmurs, tachycardic Gastrointestinal system: Soft, hypoactive bowel sounds, mildly tender to deep palpation over left and right suprapubic areas, no rebound or guarding Central nervous system: Alert and oriented.  Intact, moving all extremities Extremities: There is lower extremity edema. Skin: No rashes over visible skin Psychiatry: Judgement and insight appear normal. Mood & affect appropriate.     Data Reviewed: I have personally reviewed following labs and imaging studies  CBC: Recent Labs  Lab 06/13/18 1754 06/13/18 2344 06/15/18 0357 06/16/18 0305  WBC 8.3 8.7 8.8 9.2  NEUTROABS 5.9  --   --   --   HGB 11.2* 11.4* 10.9* 10.1*  HCT 35.0* 35.8* 34.4* 31.9*  MCV 94.3 95.0 94.0 94.7  PLT 306 301 310 664   Basic Metabolic Panel: Recent Labs  Lab 06/13/18 1754 06/13/18 2344 06/15/18 0357 06/16/18 0305  NA 137  --  135 128*  K 3.4*  --  4.2 4.9  CL 103  --  103 97*  CO2 25  --  24 24  GLUCOSE 120*  --  209* 295*  BUN 12  --  15 25*  CREATININE 0.53  --  0.64 0.97  CALCIUM 8.9  --  8.5* 8.7*  MG  --  1.6* 1.7  --   PHOS  --   --  3.0  --    GFR: CrCl cannot be calculated (Unknown ideal weight.). Liver Function Tests: Recent Labs  Lab 06/13/18 1754 06/15/18 0357  AST 17 13*  ALT 13 12  ALKPHOS 82 85  BILITOT 0.5 0.6  PROT 5.6* 5.4*  ALBUMIN 2.0* 2.0*   Recent Labs  Lab 06/13/18 1754  LIPASE 16   No results for input(s): AMMONIA in the last 168 hours. Coagulation Profile: No results for input(s): INR, PROTIME in the last 168 hours. Cardiac Enzymes: Recent Labs  Lab 06/13/18 2344 06/14/18 0938 06/14/18 1541  TROPONINI <0.03 <0.03 <0.03   BNP (last 3 results) No results for input(s): PROBNP in the last 8760 hours. HbA1C: No results for input(s): HGBA1C in the last 72 hours. CBG: Recent Labs  Lab 06/16/18 0756   GLUCAP 205*   Lipid Profile: No results for input(s): CHOL, HDL, LDLCALC, TRIG, CHOLHDL, LDLDIRECT in the last 72 hours. Thyroid Function Tests: Recent Labs    06/15/18 0357 06/15/18 0814  TSH 0.331*  --   FREET4  --  1.18   Anemia Panel: No results for input(s): VITAMINB12, FOLATE, FERRITIN, TIBC, IRON, RETICCTPCT in the last 72 hours. Sepsis Labs: Recent Labs  Lab 06/13/18 1738 06/13/18 2344  LATICACIDVEN 1.73 1.6    Recent Results (from the past 240 hour(s))  C difficile quick scan w PCR reflex     Status: None   Collection Time: 06/14/18 11:23 AM  Result Value Ref Range Status   C Diff antigen NEGATIVE NEGATIVE Final   C Diff toxin NEGATIVE NEGATIVE Final   C Diff interpretation No C. difficile detected.  Final    Comment: Performed at [facility], [address] [place], [place] [address]  Gastrointestinal Panel by PCR , Stool     Status: None   Collection Time: 06/14/18 11:23 AM  Result Value Ref Range Status   Campylobacter species NOT DETECTED NOT DETECTED Final   Plesimonas shigelloides NOT DETECTED NOT DETECTED Final   Salmonella species NOT DETECTED NOT DETECTED Final   Yersinia enterocolitica NOT DETECTED NOT DETECTED Final   Vibrio species NOT DETECTED NOT DETECTED Final   Vibrio cholerae NOT DETECTED NOT DETECTED Final   Enteroaggregative E coli (EAEC) NOT DETECTED NOT DETECTED Final   Enteropathogenic E coli (EPEC) NOT DETECTED NOT DETECTED Final   Enterotoxigenic E coli (ETEC) NOT DETECTED NOT DETECTED Final   Shiga like toxin producing E coli (STEC) NOT DETECTED NOT DETECTED Final   Shigella/Enteroinvasive E coli (EIEC) NOT DETECTED NOT DETECTED Final   Cryptosporidium NOT DETECTED NOT DETECTED Final   Cyclospora cayetanensis NOT DETECTED NOT DETECTED Final   Entamoeba histolytica NOT DETECTED NOT DETECTED Final   Giardia lamblia NOT DETECTED NOT DETECTED Final   Adenovirus F40/41 NOT DETECTED NOT DETECTED Final   Astrovirus NOT  DETECTED NOT DETECTED Final   Norovirus GI/GII NOT DETECTED NOT DETECTED Final   Rotavirus A NOT DETECTED NOT DETECTED Final   Sapovirus (I, II, IV, and V) NOT DETECTED NOT DETECTED Final    Comment: Performed at [facility], [address], [place], [place] [address]         Radiology Studies: [place] Ekg Site [place]  Result Date: 06/14/2018 If [facility] image not attached, placement could not be confirmed due to current cardiac rhythm.       Scheduled Meds: . amLODipine  10 mg Oral Daily  . cholecalciferol  1,000 Units Oral Daily  . feeding supplement  1 Container Oral TID BM  . hydrocortisone   Rectal TID  . insulin aspart  0-5 Units Subcutaneous QHS  . insulin aspart  0-9 Units Subcutaneous TID WC  . mesalamine  2.4 g Oral Q breakfast  . methylPREDNISolone (SOLU-MEDROL) injection  60 mg Intravenous Q12H  . metoprolol tartrate  25 mg Oral Q6H  . sodium chloride flush  10-40 mL Intracatheter Q12H   Continuous Infusions: . sodium chloride 10 mL/hr at 06/14/18 1729  . sodium chloride 50 mL/hr at 06/16/18 0935     LOS: 0 days    Time spent: [place], MD [facility]  If 7PM-7AM, please contact night-coverage [email or web address] Password [email or web address] 06/16/2018, 10:36 AM

## 2018-06-16 NOTE — Progress Notes (Signed)
[facility] Progress Note  Subjective: The patient is seen in regards to history of ulcerative colitis. She is currently having a flareup. She feels better today. Solu-Medrol was started. Denies abdominal pain. Has had bloody stools but doesn't report any last night but states she did not look. GI pathogens panel negative.  Objective: Vital signs in last 24 hours: Temp:  [97.5 F (36.4 C)-98 F (36.7 C)] 97.8 F (36.6 C) (10/28 0524) Pulse Rate:  [76-133] 101 (10/28 0516) Resp:  [12-18] 12 (10/28 0524) BP: (85-132)/(44-86) 117/81 (10/28 0524) SpO2:  [95 %-99 %] 99 % (10/28 0524) Weight change:    PE:  Heart regular rhythm  Lungs clear  Abdomen soft and nontender  Lab Results: Results for orders placed or performed during the hospital encounter of 06/13/18 (from the past 24 hour(s))  CBC     Status: Abnormal   Collection Time: 06/16/18  3:05 AM  Result Value Ref Range   WBC 9.2 4.0 - 10.5 K/uL   RBC 3.37 (L) 3.87 - 5.11 MIL/uL   Hemoglobin 10.1 (L) 12.0 - 15.0 g/dL   HCT 31.9 (L) 36.0 - 46.0 %   MCV 94.7 80.0 - 100.0 fL   MCH 30.0 26.0 - 34.0 pg   MCHC 31.7 30.0 - 36.0 g/dL   RDW 12.7 11.5 - 15.5 %   Platelets 292 150 - 400 K/uL   nRBC 0.0 0.0 - 0.2 %  Basic metabolic panel     Status: Abnormal   Collection Time: 06/16/18  3:05 AM  Result Value Ref Range   Sodium 128 (L) 135 - 145 mmol/L   Potassium 4.9 3.5 - 5.1 mmol/L   Chloride 97 (L) 98 - 111 mmol/L   CO2 24 22 - 32 mmol/L   Glucose, Bld 295 (H) 70 - 99 mg/dL   BUN 25 (H) 8 - 23 mg/dL   Creatinine, Ser 0.97 0.44 - 1.00 mg/dL   Calcium 8.7 (L) 8.9 - 10.3 mg/dL   GFR calc non Af Amer 50 (L) >60 mL/min   GFR calc Af Amer 58 (L) >60 mL/min   Anion gap 7 5 - 15  Glucose, capillary     Status: Abnormal   Collection Time: 06/16/18  7:56 AM  Result Value Ref Range   Glucose-Capillary 205 (H) 70 - 99 mg/dL    Studies/Results: No results found.    Assessment: Ulcerative colitis with flareup. GI  pathogens panel and C. Difficile negative.  Plan:   Continue supportive care. Continue Solu-Medrol.    Cassell Clement 06/16/2018, 9:28 AM  Pager: [phone] If no answer or after 5 PM call [phone]

## 2018-06-16 NOTE — Progress Notes (Signed)
Physical Therapy Treatment Patient Details Name: Felicia Carlson MRN: [id number] DOB: [birth year] Today's Date: 06/16/2018    History of Present Illness 84 yo female admitted with LGIB, chest pain, abnormal ECG. hx of breast ca, UC, CKD    PT Comments    Progressing slowly with mobility. Pt was able to ambulate a short distance on today. She remains weak and unsteady. Discussed d/c plan-pt stated she has assistance available at home and she plans to return home once discharged. Will continue to follow.     Follow Up Recommendations  Home health PT;Supervision/Assistance - 24 hour(pt stated she has assistance at home and plan is to return home)     Equipment Recommendations  None recommended by PT    Recommendations for Other Services       Precautions / Restrictions Precautions Precautions: Fall Precaution Comments: bloody drainage from rectum Restrictions Weight Bearing Restrictions: No    Mobility  Bed Mobility Overal bed mobility: Needs Assistance Bed Mobility: Supine to Sit;Sit to Supine     Supine to sit: Min guard;HOB elevated Sit to supine: Min guard;HOB elevated   General bed mobility comments: Increased time.   Transfers Overall transfer level: Needs assistance Equipment used: (IV pole) Transfers: Sit to/from Stand Sit to Stand: Min assist Stand pivot transfers: Min assist       General transfer comment: Assist to rise, stabilize, control descent. Pt required 2 hand support on IV pole for balance/assist. Increased time.   Ambulation/Gait Ambulation/Gait assistance: Min Web designer (Feet): 15 Feet Assistive device: IV Pole Gait Pattern/deviations: Step-through pattern;Trunk flexed     General Gait Details: Assist to stabilize pt and maneuver with IV pole. Pt relied on pole with 2 hands for support. Slow gait speed.    Stairs             Wheelchair Mobility    Modified Rankin (Stroke Patients Only)       Balance Overall  balance assessment: Needs assistance         Standing balance support: Bilateral upper extremity supported Standing balance-Leahy Scale: Poor                              Cognition Arousal/Alertness: Awake/alert Behavior During Therapy: WFL for tasks assessed/performed Overall Cognitive Status: Within Functional Limits for tasks assessed                                        Exercises      General Comments        Pertinent Vitals/Pain Pain Assessment: No/denies pain Pain Score: 7  Pain Location: abdomen, back Pain Descriptors / Indicators: Aching;Discomfort Pain Intervention(s): Patient requesting pain meds-RN notified    Home Living Family/patient expects to be discharged to:: Private residence Living Arrangements: Alone Available Help at Discharge: Family;Available PRN/intermittently(daughter n law helping/staying with pt recently) Type of Home: House Home Access: Level entry   Home Layout: One level Home Equipment: Walker - 2 wheels;Cane - single point      Prior Function Level of Independence: Needs assistance  Gait / Transfers Assistance Needed: uses RW vs cane ADL's / Homemaking Assistance Needed: daughter n law assists as needed     PT Goals (current goals can now be found in the care plan section) Acute Rehab PT Goals Patient Stated Goal: home with kids to A Progress  towards PT goals: Progressing toward goals    Frequency    Min 3X/week      PT Plan Current plan remains appropriate    Co-evaluation              AM-PAC PT "6 Clicks" Daily Activity  Outcome Measure  Difficulty turning over in bed (including adjusting bedclothes, sheets and blankets)?: A Little Difficulty moving from lying on back to sitting on the side of the bed? : A Little Difficulty sitting down on and standing up from a chair with arms (e.g., wheelchair, bedside commode, etc,.)?: Unable Help needed moving to and from a bed to chair  (including a wheelchair)?: A Little Help needed walking in hospital room?: A Little Help needed climbing 3-5 steps with a railing? : A Lot 6 Click Score: 15    End of Session Equipment Utilized During Treatment: Gait belt Activity Tolerance: Patient tolerated treatment well Patient left: in bed;with call bell/phone within reach;with bed alarm set   PT Visit Diagnosis: Muscle weakness (generalized) (M62.81);Difficulty in walking, not elsewhere classified (R26.2)     Time: 8832-5498 PT Time Calculation (min) (ACUTE ONLY): 10 min  Charges:  $Gait Training: 8-22 mins                        Weston Anna, PT Acute Rehabilitation Services Pager: [phone] Office: [phone]

## 2018-06-17 LAB — CBC
HCT: 31 % — ABNORMAL LOW (ref 36.0–46.0)
Hemoglobin: 9.8 g/dL — ABNORMAL LOW (ref 12.0–15.0)
MCH: 30.2 pg (ref 26.0–34.0)
MCHC: 31.6 g/dL (ref 30.0–36.0)
MCV: 95.4 fL (ref 80.0–100.0)
Platelets: 270 K/uL (ref 150–400)
RBC: 3.25 MIL/uL — ABNORMAL LOW (ref 3.87–5.11)
RDW: 12.6 % (ref 11.5–15.5)
WBC: 8.3 K/uL (ref 4.0–10.5)
nRBC: 0 % (ref 0.0–0.2)

## 2018-06-17 LAB — GLUCOSE, CAPILLARY
GLUCOSE-CAPILLARY: 184 mg/dL — AB (ref 70–99)
Glucose-Capillary: 251 mg/dL — ABNORMAL HIGH (ref 70–99)
Glucose-Capillary: 141 mg/dL — ABNORMAL HIGH (ref 70–99)
Glucose-Capillary: 234 mg/dL — ABNORMAL HIGH (ref 70–99)

## 2018-06-17 LAB — BASIC METABOLIC PANEL
BUN: 22 mg/dL (ref 8–23)
CO2: 22 mmol/L (ref 22–32)
Chloride: 103 mmol/L (ref 98–111)
Creatinine, Ser: 0.8 mg/dL (ref 0.44–1.00)
Glucose, Bld: 243 mg/dL — ABNORMAL HIGH (ref 70–99)
Potassium: 4.9 mmol/L (ref 3.5–5.1)
Sodium: 131 mmol/L — ABNORMAL LOW (ref 135–145)

## 2018-06-17 LAB — HEMOGLOBIN A1C
Hgb A1c MFr Bld: 6.6 % — ABNORMAL HIGH (ref 4.8–5.6)
Mean Plasma Glucose: 142.72 mg/dL

## 2018-06-17 LAB — BASIC METABOLIC PANEL WITH GFR
Anion gap: 6 (ref 5–15)
Calcium: 8.7 mg/dL — ABNORMAL LOW (ref 8.9–10.3)
GFR calc Af Amer: >60 mL/min (ref >60)
GFR calc non Af Amer: >60 mL/min (ref >60)

## 2018-06-17 MED ORDER — METOPROLOL TARTRATE 25 MG PO TABS
37.5000 mg | ORAL_TABLET | Freq: Four times a day (QID) | ORAL | Status: DC
  Administered 2018-06-17 – 2018-06-19 (×7): 37.5 mg via ORAL
  Filled 2018-06-17 (×7): qty 2

## 2018-06-17 MED ORDER — DILTIAZEM HCL 25 MG/5ML IV SOLN
10.0000 mg | Freq: Once | INTRAVENOUS | Status: AC
  Administered 2018-06-17: 10 mg via INTRAVENOUS
  Filled 2018-06-17: qty 5

## 2018-06-17 NOTE — Progress Notes (Signed)
Paged on call provider for hr of 120-150 bpm

## 2018-06-17 NOTE — Progress Notes (Signed)
[facility] Progress Note  Subjective: Some abdominal discomfort today. She states stools and blood in stools is less  Objective: Vital signs in last 24 hours: Temp:  [97.5 F (36.4 C)-97.8 F (36.6 C)] 97.7 F (36.5 C) (10/29 0552) Pulse Rate:  [81-137] 112 (10/29 0925) Resp:  [18-20] 18 (10/29 0552) BP: (102-150)/(64-98) 139/97 (10/29 0925) SpO2:  [95 %-98 %] 98 % (10/29 0552) Weight change:    PE:.  No distress  Heart regular rhythm  Lungs clear  Abdomen: Bowel sounds present, soft, mild discomfort mid and lower abdomen  Lab Results: Results for orders placed or performed during the hospital encounter of 06/13/18 (from the past 24 hour(s))  Glucose, capillary     Status: Abnormal   Collection Time: 06/16/18 12:01 PM  Result Value Ref Range   Glucose-Capillary 247 (H) 70 - 99 mg/dL  Glucose, capillary     Status: Abnormal   Collection Time: 06/16/18  5:04 PM  Result Value Ref Range   Glucose-Capillary 276 (H) 70 - 99 mg/dL  Glucose, capillary     Status: Abnormal   Collection Time: 06/16/18  9:21 PM  Result Value Ref Range   Glucose-Capillary 168 (H) 70 - 99 mg/dL  Hemoglobin A1c     Status: Abnormal   Collection Time: 06/17/18  4:28 AM  Result Value Ref Range   Hgb A1c MFr Bld 6.6 (H) 4.8 - 5.6 %   Mean Plasma Glucose 142.72 mg/dL  CBC     Status: Abnormal   Collection Time: 06/17/18  4:28 AM  Result Value Ref Range   WBC 8.3 4.0 - 10.5 K/uL   RBC 3.25 (L) 3.87 - 5.11 MIL/uL   Hemoglobin 9.8 (L) 12.0 - 15.0 g/dL   HCT 31.0 (L) 36.0 - 46.0 %   MCV 95.4 80.0 - 100.0 fL   MCH 30.2 26.0 - 34.0 pg   MCHC 31.6 30.0 - 36.0 g/dL   RDW 12.6 11.5 - 15.5 %   Platelets 270 150 - 400 K/uL   nRBC 0.0 0.0 - 0.2 %  Basic metabolic panel     Status: Abnormal   Collection Time: 06/17/18  4:28 AM  Result Value Ref Range   Sodium 131 (L) 135 - 145 mmol/L   Potassium 4.9 3.5 - 5.1 mmol/L   Chloride 103 98 - 111 mmol/L   CO2 22 22 - 32 mmol/L   Glucose, Bld  243 (H) 70 - 99 mg/dL   BUN 22 8 - 23 mg/dL   Creatinine, Ser 0.80 0.44 - 1.00 mg/dL   Calcium 8.7 (L) 8.9 - 10.3 mg/dL   GFR calc non Af Amer >60 >60 mL/min   GFR calc Af Amer >60 >60 mL/min   Anion gap 6 5 - 15  Glucose, capillary     Status: Abnormal   Collection Time: 06/17/18  8:03 AM  Result Value Ref Range   Glucose-Capillary 184 (H) 70 - 99 mg/dL    Studies/Results: No results found.    Assessment: Colitis with flareup. Most likely ulcerative colitis, could be ischemic colitis. GI pathogens panel negative.  Plan:   Continue supportive care and IV steroids.    Felicia Carlson 06/17/2018, 10:20 AM  Pager: [phone] If no answer or after 5 PM call [phone] Lab Results  Component Value Date   HGB 9.8 (L) 06/17/2018   HGB 10.1 (L) 06/16/2018   HGB 10.9 (L) 06/15/2018   HGB 12.9 12/06/2008   HGB 12.2 10/29/2007   HGB 13.2  09/02/2007   HCT 31.0 (L) 06/17/2018   HCT 31.9 (L) 06/16/2018   HCT 34.4 (L) 06/15/2018   HCT 37.7 12/06/2008   HCT 35.5 10/29/2007   HCT 37.8 09/02/2007   ALKPHOS 85 06/15/2018   ALKPHOS 82 06/13/2018   ALKPHOS 64 11/01/2010   AST 13 (L) 06/15/2018   AST 17 06/13/2018   AST 29 11/01/2010   ALT 12 06/15/2018   ALT 13 06/13/2018   ALT 15 11/01/2010

## 2018-06-17 NOTE — Care Management Note (Signed)
Case Management Note  Patient Details  Name: Felicia Carlson MRN: [id number] Date of Birth: 1925-02-27  Subjective/Objective:  PT/OT-HH. Provided [place] agency list to dtr-Sharon-await choice. Await final [place] orders.                  Action/Plan:d/c home w/HHC.   Expected Discharge Date:  (unknown)               Expected Discharge Plan:  [place]  In-House Referral:     Discharge planning Services  CM Consult  Post Acute Care Choice:  Durable Medical Equipment(cane,rw) Choice offered to:  Adult Children  DME Arranged:    DME Agency:     HH Arranged:    HH Agency:     Status of Service:  In process, will continue to follow  If discussed at Long Length of Stay Meetings, dates discussed:    Additional Comments:  Dessa Phi, RN 06/17/2018, 3:15 PM

## 2018-06-17 NOTE — Progress Notes (Signed)
PROGRESS NOTE    Felicia Carlson  OIZ:[id number] DOB: 27-Jul-1925 DOA: 06/13/2018 PCP: Rodena Medin, MD (Inactive)    Brief Narrative:  84 year old woman with past medical history relevant for ulcerative colitis followed at [facility], CKD unknown stage, hypertension, breast cancer status post chemoradiation in remission admitted with abdominal pain, hematochezia and CT findings concerning for rectosigmoid colitis concerning for UC flare.  Her hospital course has been comp gated by runs of atrial fibrillation with RVR.   Assessment & Plan:   Active Problems:   Lower GI bleed   Ulcerative colitis with complication (HCC)   Chest pain   Debility   Hypokalemia   Cardiac murmur   GIB (gastrointestinal bleeding)   #) Ulcerative colitis flare: Improving with steroids and advancing diet .-Continue IV methylprednisone 60 mg every 12 -Fecal calprotectin ordered - Gastroenterology consult [facility] -C. difficile negative, GI panel negative -Continue mesalamine 2.4 g daily -Advance to soft, bland  #) Paroxysmal atrial fibrillation: Improving with beta-blocker however continues to have episodes of RVR with exertion. -Echo on 06/14/2018 shows only grade 1 diastolic dysfunction E - increase metoprolol tartrate to 37.5 mg every 6 hours -Telemetry  -Patient has rivaroxaban on her medication list though she cannot remember if she is still taking this.  Regardless she is just stopped having bloody diarrhea  #) CKD: Patient carries a diagnosis however her creatinine here is normal.  It might be underestimating due to her frailty however it is not clear that the patient does have CKD.  #) Grade 1 diastolic dysfunction: Euvolemic  #) Hypertension: -Continue amlodipine 10 mg daily -Continue beta-blocker per above  Fluids: Tolerating p.o. Electrodes: Monitor and supplement Nutrition: Soft, bland diet  Prophylaxis: SCDs  Disposition: Pending resolution of ulcerative colitis flare and  heart rate control  DO NOT RESUSCITATE   Consultants:   Gastroenterology  Procedures:  06/14/2018 echo: - Left ventricle: The cavity size was normal. Wall thickness was   increased in a pattern of mild LVH. Systolic function was   vigorous. The estimated ejection fraction was in the range of 65%   to 70%. Wall motion was normal; there were no regional wall   motion abnormalities. Doppler parameters are consistent with   abnormal left ventricular relaxation (grade 1 diastolic   dysfunction). - Aortic valve: There was mild regurgitation. Valve area (VTI):   2.62 cm^2. Valve area (Vmax): 2.38 cm^2. Valve area (Vmean): 2.55   cm^2. - Pulmonary arteries: Systolic pressure was moderately increased.    PA peak pressure: 46 mm Hg (S).  Antimicrobials:   None   Subjective: Patient reports that she is doing well.  She denies any nausea, vomiting, diarrhea, cough, congestion.  She continues to have small volumes of stools and some tenesmus but this is improving.  Objective: Vitals:   06/17/18 0212 06/17/18 0239 06/17/18 0552 06/17/18 0925  BP:  106/74 (!) 150/98 (!) 139/97  Pulse: 95 (!) 120 81 (!) 112  Resp:  20 18   Temp:  97.7 F (36.5 C) 97.7 F (36.5 C)   TempSrc:  Oral Oral   SpO2:  96% 98%   Height:        Intake/Output Summary (Last 24 hours) at 06/17/2018 1033 Last data filed at 06/17/2018 0400 Gross per 24 hour  Intake 1614 ml  Output -  Net 1614 ml   There were no vitals filed for this visit.  Examination:  General exam: Younger than stated age, lying in bed, no acute distress  Respiratory system: Clear to auscultation. Respiratory effort normal. Cardiovascular system: Irregularly irregular, no murmurs,  Gastrointestinal system: Soft, hypoactive bowel sounds, no rebound or guarding, minimal tenderness to palpation over the suprapubic area Central nervous system: Alert and oriented.  Intact, moving all extremities Extremities: There is lower extremity  edema. Skin: No rashes over visible skin Psychiatry: Judgement and insight appear normal. Mood & affect appropriate.     Data Reviewed: I have personally reviewed following labs and imaging studies  CBC: Recent Labs  Lab 06/13/18 1754 06/13/18 2344 06/15/18 0357 06/16/18 0305 06/17/18 0428  WBC 8.3 8.7 8.8 9.2 8.3  NEUTROABS 5.9  --   --   --   --   HGB 11.2* 11.4* 10.9* 10.1* 9.8*  HCT 35.0* 35.8* 34.4* 31.9* 31.0*  MCV 94.3 95.0 94.0 94.7 95.4  PLT 306 301 310 292 825   Basic Metabolic Panel: Recent Labs  Lab 06/13/18 1754 06/13/18 2344 06/15/18 0357 06/16/18 0305 06/17/18 0428  NA 137  --  135 128* 131*  K 3.4*  --  4.2 4.9 4.9  CL 103  --  103 97* 103  CO2 25  --  24 24 22   GLUCOSE 120*  --  209* 295* 243*  BUN 12  --  15 25* 22  CREATININE 0.53  --  0.64 0.97 0.80  CALCIUM 8.9  --  8.5* 8.7* 8.7*  MG  --  1.6* 1.7  --   --   PHOS  --   --  3.0  --   --    GFR: CrCl cannot be calculated (Unknown ideal weight.). Liver Function Tests: Recent Labs  Lab 06/13/18 1754 06/15/18 0357  AST 17 13*  ALT 13 12  ALKPHOS 82 85  BILITOT 0.5 0.6  PROT 5.6* 5.4*  ALBUMIN 2.0* 2.0*   Recent Labs  Lab 06/13/18 1754  LIPASE 16   No results for input(s): AMMONIA in the last 168 hours. Coagulation Profile: No results for input(s): INR, PROTIME in the last 168 hours. Cardiac Enzymes: Recent Labs  Lab 06/13/18 2344 06/14/18 0938 06/14/18 1541  TROPONINI <0.03 <0.03 <0.03   BNP (last 3 results) No results for input(s): PROBNP in the last 8760 hours. HbA1C: Recent Labs    06/17/18 0428  HGBA1C 6.6*   CBG: Recent Labs  Lab 06/16/18 0756 06/16/18 1201 06/16/18 1704 06/16/18 2121 06/17/18 0803  GLUCAP 205* 247* 276* 168* 184*   Lipid Profile: No results for input(s): CHOL, HDL, LDLCALC, TRIG, CHOLHDL, LDLDIRECT in the last 72 hours. Thyroid Function Tests: Recent Labs    06/15/18 0357 06/15/18 0814  TSH 0.331*  --   FREET4  --  1.18  T3FREE   --  1.4*   Anemia Panel: No results for input(s): VITAMINB12, FOLATE, FERRITIN, TIBC, IRON, RETICCTPCT in the last 72 hours. Sepsis Labs: Recent Labs  Lab 06/13/18 1738 06/13/18 2344  LATICACIDVEN 1.73 1.6    Recent Results (from the past 240 hour(s))  C difficile quick scan w PCR reflex     Status: None   Collection Time: 06/14/18 11:23 AM  Result Value Ref Range Status   C Diff antigen NEGATIVE NEGATIVE Final   C Diff toxin NEGATIVE NEGATIVE Final   C Diff interpretation No C. difficile detected.  Final    Comment: Performed at [facility], [place] [address], [place], [place] [address]  Gastrointestinal Panel by PCR , Stool     Status: None   Collection Time: 06/14/18 11:23 AM  Result Value Ref Range Status   Campylobacter species NOT DETECTED NOT DETECTED Final   Plesimonas shigelloides NOT DETECTED NOT DETECTED Final   Salmonella species NOT DETECTED NOT DETECTED Final   Yersinia enterocolitica NOT DETECTED NOT DETECTED Final   Vibrio species NOT DETECTED NOT DETECTED Final   Vibrio cholerae NOT DETECTED NOT DETECTED Final   Enteroaggregative E coli (EAEC) NOT DETECTED NOT DETECTED Final   Enteropathogenic E coli (EPEC) NOT DETECTED NOT DETECTED Final   Enterotoxigenic E coli (ETEC) NOT DETECTED NOT DETECTED Final   Shiga like toxin producing E coli (STEC) NOT DETECTED NOT DETECTED Final   Shigella/Enteroinvasive E coli (EIEC) NOT DETECTED NOT DETECTED Final   Cryptosporidium NOT DETECTED NOT DETECTED Final   Cyclospora cayetanensis NOT DETECTED NOT DETECTED Final   Entamoeba histolytica NOT DETECTED NOT DETECTED Final   Giardia lamblia NOT DETECTED NOT DETECTED Final   Adenovirus F40/41 NOT DETECTED NOT DETECTED Final   Astrovirus NOT DETECTED NOT DETECTED Final   Norovirus GI/GII NOT DETECTED NOT DETECTED Final   Rotavirus A NOT DETECTED NOT DETECTED Final   Sapovirus (I, II, IV, and V) NOT DETECTED NOT DETECTED Final    Comment: Performed at [facility], [address] [place], [place] [address]  Calprotectin, Fecal     Status: Abnormal   Collection Time: 06/14/18 11:23 AM  Result Value Ref Range Status   Calprotectin, Fecal 1,239 (H) 0 - 120 ug/g Final    Comment: (NOTE) Concentration     Interpretation   Follow-Up <16 - 50 ug/g     Normal           None >50 -120 ug/g     Borderline       Re-evaluate in 4-6 weeks    >120 ug/g     Abnormal         Repeat as clinically                                   indicated Performed At: [facility] [place], [place] [id number] Rush Farmer MD QM:[id number]          Radiology Studies: No results found.      Scheduled Meds: . amLODipine  10 mg Oral Daily  . cholecalciferol  1,000 Units Oral Daily  . feeding supplement  1 Container Oral TID BM  . hydrocortisone   Rectal TID  . insulin aspart  0-5 Units Subcutaneous QHS  . insulin aspart  0-9 Units Subcutaneous TID WC  . mesalamine  2.4 g Oral Q breakfast  . methylPREDNISolone (SOLU-MEDROL) injection  60 mg Intravenous Q12H  . metoprolol tartrate  37.5 mg Oral Q6H  . sodium chloride flush  10-40 mL Intracatheter Q12H   Continuous Infusions: . sodium chloride 10 mL/hr at 06/14/18 1729     LOS: 1 day    Time spent: [place], MD [facility]  If 7PM-7AM, please contact night-coverage [email or web address] Password [email or web address] 06/17/2018, 10:33 AM

## 2018-06-17 NOTE — Progress Notes (Signed)
Occupational Therapy Treatment Patient Details Name: MAKAYLYNN BONILLAS MRN: [id number] DOB: 01-05-1925 Today's Date: 06/17/2018    History of present illness 84 yo female admitted with LGIB, chest pain, abnormal ECG. hx of breast ca, UC, CKD   OT comments  Pt very agreeable but having discomfort in abdomen. Pt had been pre medicated  Follow Up Recommendations  Home health OT;Supervision/Assistance - 24 hour    Equipment Recommendations  None recommended by OT    Recommendations for Other Services      Precautions / Restrictions Precautions Precautions: Fall       Mobility Bed Mobility Overal bed mobility: Needs Assistance Bed Mobility: Supine to Sit     Supine to sit: Min guard;HOB elevated     General bed mobility comments: Increased time.   Transfers Overall transfer level: Needs assistance Equipment used: Rolling walker (2 wheeled)(IV pole) Transfers: Sit to/from [facility] Sit to Stand: Min assist Stand pivot transfers: Min assist            Balance Overall balance assessment: Needs assistance   Sitting balance-Leahy Scale: Good     Standing balance support: Bilateral upper extremity supported Standing balance-Leahy Scale: Poor                             ADL either performed or assessed with clinical judgement   ADL Overall ADL's : Needs assistance/impaired Eating/Feeding: Sitting;Set up   Grooming: Standing;Supervision/safety                   Toilet Transfer: Minimal assistance;RW;Cueing for sequencing;Cueing for safety;Stand-pivot;BSC   Toileting- Clothing Manipulation and Hygiene: Minimal assistance;Cueing for safety;Cueing for sequencing       Functional mobility during ADLs: Minimal assistance       Vision Patient Visual Report: No change from baseline            Cognition Arousal/Alertness: Awake/alert Behavior During Therapy: WFL for tasks assessed/performed Overall Cognitive Status: Within  Functional Limits for tasks assessed                                                     Pertinent Vitals/ Pain       Pain Score: 5  Pain Location: abdomen Pain Intervention(s): Limited activity within patient's tolerance;Repositioned;Monitored during session         Frequency  Min 2X/week        Progress Toward Goals  OT Goals(current goals can now be found in the care plan section)  Progress towards OT goals: Progressing toward goals     Plan Discharge plan remains appropriate       AM-PAC PT "6 Clicks" Daily Activity     Outcome Measure   Help from another person eating meals?: None Help from another person taking care of personal grooming?: A Little Help from another person toileting, which includes using toliet, bedpan, or urinal?: A Little Help from another person bathing (including washing, rinsing, drying)?: A Little Help from another person to put on and taking off regular upper body clothing?: A Little Help from another person to put on and taking off regular lower body clothing?: A Lot 6 Click Score: 18    End of Session Equipment Utilized During Treatment: Rolling walker  OT Visit Diagnosis: Unsteadiness on feet (R26.81);Other abnormalities  of gait and mobility (R26.89);Pain Pain - part of body: (stomach)   Activity Tolerance Patient tolerated treatment well   Patient Left in chair;with call bell/phone within reach;with chair alarm set   Nurse Communication Mobility status        Time: 0814-4818 OT Time Calculation (min): 15 min  Charges: OT General Charges $OT Visit: 1 Visit OT Treatments $Self Care/Home Management : 8-22 mins  Kari Baars, [place] [phone] Office- [phone]      [place], Edwena Felty D 06/17/2018, 2:47 PM

## 2018-06-18 LAB — BASIC METABOLIC PANEL
BUN: 23 mg/dL (ref 8–23)
Calcium: 8.6 mg/dL — ABNORMAL LOW (ref 8.9–10.3)
Chloride: 106 mmol/L (ref 98–111)
Creatinine, Ser: 0.75 mg/dL (ref 0.44–1.00)
GFR calc Af Amer: >60 mL/min (ref >60)
GFR calc non Af Amer: >60 mL/min (ref >60)
Glucose, Bld: 193 mg/dL — ABNORMAL HIGH (ref 70–99)
Sodium: 134 mmol/L — ABNORMAL LOW (ref 135–145)

## 2018-06-18 LAB — CBC
HCT: 29.5 % — ABNORMAL LOW (ref 36.0–46.0)
Hemoglobin: 9.4 g/dL — ABNORMAL LOW (ref 12.0–15.0)
MCH: 30 pg (ref 26.0–34.0)
MCHC: 31.9 g/dL (ref 30.0–36.0)
MCV: 94.2 fL (ref 80.0–100.0)
Platelets: 257 10*3/uL (ref 150–400)
RBC: 3.13 MIL/uL — ABNORMAL LOW (ref 3.87–5.11)
RDW: 12.9 % (ref 11.5–15.5)
WBC: 8.3 10*3/uL (ref 4.0–10.5)
nRBC: 0 % (ref 0.0–0.2)

## 2018-06-18 LAB — BASIC METABOLIC PANEL WITH GFR
Anion gap: 5 (ref 5–15)
CO2: 23 mmol/L (ref 22–32)
Potassium: 4.5 mmol/L (ref 3.5–5.1)

## 2018-06-18 LAB — GLUCOSE, CAPILLARY
GLUCOSE-CAPILLARY: 263 mg/dL — AB (ref 70–99)
Glucose-Capillary: 204 mg/dL — ABNORMAL HIGH (ref 70–99)
Glucose-Capillary: 181 mg/dL — ABNORMAL HIGH (ref 70–99)
Glucose-Capillary: 218 mg/dL — ABNORMAL HIGH (ref 70–99)

## 2018-06-18 LAB — MAGNESIUM: Magnesium: 1.8 mg/dL (ref 1.7–2.4)

## 2018-06-18 NOTE — Care Management Note (Signed)
Case Management Note  Patient Details  Name: Felicia Carlson MRN: [id number] Date of Birth: Aug 21, 1924  Subjective/Objective:  [facility] chosen by dtr Mateo Flow per patient [facility] rep Brad aware of HHPT/OT. No further CM needs.                  Action/Plan:d/c home w/HHC.   Expected Discharge Date:  (unknown)               Expected Discharge Plan:  [place]  In-House Referral:     Discharge planning Services  CM Consult  Post Acute Care Choice:  Durable Medical Equipment(cane,rw) Choice offered to:  Adult Children  DME Arranged:    DME Agency:     HH Arranged:  PT, OT [place] Agency:  [place]  Status of Service:  Completed, signed off  If discussed at [place] of Stay Meetings, dates discussed:    Additional Comments:  Dessa Phi, RN 06/18/2018, 3:22 PM

## 2018-06-18 NOTE — Progress Notes (Signed)
[facility] Progress Note  Subjective: Doing better. Less stools and less blood  Objective: Vital signs in last 24 hours: Temp:  [97.4 F (36.3 C)-97.7 F (36.5 C)] 97.4 F (36.3 C) (10/30 0412) Pulse Rate:  [50-114] 68 (10/30 0549) Resp:  [16-20] 16 (10/30 0412) BP: (95-130)/(53-92) 129/61 (10/30 0412) SpO2:  [96 %-97 %] 96 % (10/30 0412) Weight change:    PE: Abdomen soft  Lab Results: Results for orders placed or performed during the hospital encounter of 06/13/18 (from the past 24 hour(s))  Glucose, capillary     Status: Abnormal   Collection Time: 06/17/18 12:08 PM  Result Value Ref Range   Glucose-Capillary 251 (H) 70 - 99 mg/dL  Glucose, capillary     Status: Abnormal   Collection Time: 06/17/18  4:21 PM  Result Value Ref Range   Glucose-Capillary 141 (H) 70 - 99 mg/dL  Glucose, capillary     Status: Abnormal   Collection Time: 06/17/18  9:50 PM  Result Value Ref Range   Glucose-Capillary 234 (H) 70 - 99 mg/dL  CBC     Status: Abnormal   Collection Time: 06/18/18  4:06 AM  Result Value Ref Range   WBC 8.3 4.0 - 10.5 K/uL   RBC 3.13 (L) 3.87 - 5.11 MIL/uL   Hemoglobin 9.4 (L) 12.0 - 15.0 g/dL   HCT 29.5 (L) 36.0 - 46.0 %   MCV 94.2 80.0 - 100.0 fL   MCH 30.0 26.0 - 34.0 pg   MCHC 31.9 30.0 - 36.0 g/dL   RDW 12.9 11.5 - 15.5 %   Platelets 257 150 - 400 K/uL   nRBC 0.0 0.0 - 0.2 %  Basic metabolic panel     Status: Abnormal   Collection Time: 06/18/18  4:06 AM  Result Value Ref Range   Sodium 134 (L) 135 - 145 mmol/L   Potassium 4.5 3.5 - 5.1 mmol/L   Chloride 106 98 - 111 mmol/L   CO2 23 22 - 32 mmol/L   Glucose, Bld 193 (H) 70 - 99 mg/dL   BUN 23 8 - 23 mg/dL   Creatinine, Ser 0.75 0.44 - 1.00 mg/dL   Calcium 8.6 (L) 8.9 - 10.3 mg/dL   GFR calc non Af Amer >60 >60 mL/min   GFR calc Af Amer >60 >60 mL/min   Anion gap 5 5 - 15  Magnesium     Status: None   Collection Time: 06/18/18  4:06 AM  Result Value Ref Range   Magnesium 1.8 1.7 -  2.4 mg/dL  Glucose, capillary     Status: Abnormal   Collection Time: 06/18/18  7:14 AM  Result Value Ref Range   Glucose-Capillary 204 (H) 70 - 99 mg/dL    Studies/Results: No results found.    Assessment: Colitis flare  Plan:   Continue steroids. Could be on po prednisone with taper as outpatient and then follow up with her GI at [facility]. Will sign off. Call us if needed.    Felicia Carlson 06/18/2018, 10:18 AM  Pager: [phone] If no answer or after 5 PM call [phone] Lab Results  Component Value Date   HGB 9.4 (L) 06/18/2018   HGB 9.8 (L) 06/17/2018   HGB 10.1 (L) 06/16/2018   HGB 12.9 12/06/2008   HGB 12.2 10/29/2007   HGB 13.2 09/02/2007   HCT 29.5 (L) 06/18/2018   HCT 31.0 (L) 06/17/2018   HCT 31.9 (L) 06/16/2018   HCT 37.7 12/06/2008   HCT 35.5 10/29/2007  HCT 37.8 09/02/2007   ALKPHOS 85 06/15/2018   ALKPHOS 82 06/13/2018   ALKPHOS 64 11/01/2010   AST 13 (L) 06/15/2018   AST 17 06/13/2018   AST 29 11/01/2010   ALT 12 06/15/2018   ALT 13 06/13/2018   ALT 15 11/01/2010

## 2018-06-18 NOTE — Progress Notes (Signed)
PROGRESS NOTE    Felicia Carlson  DXI:[id number] DOB: 07-19-1925 DOA: 06/13/2018 PCP: Rodena Medin, MD (Inactive)    Brief Narrative:  84 year old woman with past medical history relevant for ulcerative colitis followed at [facility], CKD unknown stage, hypertension, breast cancer status post chemoradiation in remission admitted with abdominal pain, hematochezia and CT findings concerning for rectosigmoid colitis concerning for UC flare.  Her hospital course has been comp gated by runs of atrial fibrillation with RVR.   Assessment & Plan:   Active Problems:   Lower GI bleed   Ulcerative colitis with complication (HCC)   Chest pain   Debility   Hypokalemia   Cardiac murmur   GIB (gastrointestinal bleeding)   #) Ulcerative colitis flare: Improving with steroids and advancing diet .-Continue IV methylprednisone 60 mg every 12 -Fecal calprotectin ordered - Gastroenterology consult [facility] -C. difficile negative, GI panel negative -Continue mesalamine 2.4 g daily  #) Paroxysmal atrial fibrillation: Patient no longer has episodes of tachyarrhythmia while exerting herself. -Echo on 06/14/2018 shows only grade 1 diastolic dysfunction E -Continue metoprolol tartrate to 37.5 mg every 6 hours, will consolidate on discharge -Telemetry  -Patient has rivaroxaban on her medication list though she cannot remember if she is still taking this.  Will discuss with the patient's daughter and the family about the risks and benefits.  #) CKD: Patient carries a diagnosis however her creatinine here is normal.  It might be underestimating due to her frailty however it is not clear that the patient does have CKD.  #) Grade 1 diastolic dysfunction: Euvolemic  #) Hypertension: -Continue amlodipine 10 mg daily -Continue beta-blocker per above  Fluids: Tolerating p.o. Electrodes: Monitor and supplement Nutrition: Soft, bland diet  Prophylaxis: SCDs  Disposition: Pending resolution of  ulcerative colitis flare and heart rate control  DO NOT RESUSCITATE   Consultants:   Gastroenterology  Procedures:  06/14/2018 echo: - Left ventricle: The cavity size was normal. Wall thickness was   increased in a pattern of mild LVH. Systolic function was   vigorous. The estimated ejection fraction was in the range of 65%   to 70%. Wall motion was normal; there were no regional wall   motion abnormalities. Doppler parameters are consistent with   abnormal left ventricular relaxation (grade 1 diastolic   dysfunction). - Aortic valve: There was mild regurgitation. Valve area (VTI):   2.62 cm^2. Valve area (Vmax): 2.38 cm^2. Valve area (Vmean): 2.55   cm^2. - Pulmonary arteries: Systolic pressure was moderately increased.    PA peak pressure: 46 mm Hg (S).  Antimicrobials:   None   Subjective: Patient reports that she is doing well.  She does continue to have some abdominal pain but her diarrhea is improving.  She denies any nausea, vomiting.  She denies any chest pain.  She denies any palpitations.  She is tolerating her diet well.  Objective: Vitals:   06/17/18 2341 06/17/18 2345 06/18/18 0412 06/18/18 0549  BP: 121/60 121/60 129/61   Pulse: 68 63 (!) 50 68  Resp:   16   Temp:   (!) 97.4 F (36.3 C)   TempSrc:   Oral   SpO2:  96% 96%   Height:        Intake/Output Summary (Last 24 hours) at 06/18/2018 1009 Last data filed at 06/18/2018 0934 Gross per 24 hour  Intake 600.67 ml  Output -  Net 600.67 ml   There were no vitals filed for this visit.  Examination:  General  exam: Younger than stated age, lying in bed, no acute distress Respiratory system: Clear to auscultation. Respiratory effort normal. Cardiovascular system: Irregularly irregular, no murmurs,  Gastrointestinal system: Soft, hypoactive bowel sounds, mild tender to palpation over suprapubic area and laterally Central nervous system: Alert and oriented.  Intact, moving all  extremities Extremities: There is lower extremity edema. Skin: No rashes over visible skin Psychiatry: Judgement and insight appear normal. Mood & affect appropriate.     Data Reviewed: I have personally reviewed following labs and imaging studies  CBC: Recent Labs  Lab 06/13/18 1754 06/13/18 2344 06/15/18 0357 06/16/18 0305 06/17/18 0428 06/18/18 0406  WBC 8.3 8.7 8.8 9.2 8.3 8.3  NEUTROABS 5.9  --   --   --   --   --   HGB 11.2* 11.4* 10.9* 10.1* 9.8* 9.4*  HCT 35.0* 35.8* 34.4* 31.9* 31.0* 29.5*  MCV 94.3 95.0 94.0 94.7 95.4 94.2  PLT 306 301 310 292 270 801   Basic Metabolic Panel: Recent Labs  Lab 06/13/18 1754 06/13/18 2344 06/15/18 0357 06/16/18 0305 06/17/18 0428 06/18/18 0406  NA 137  --  135 128* 131* 134*  K 3.4*  --  4.2 4.9 4.9 4.5  CL 103  --  103 97* 103 106  CO2 25  --  24 24 22 23   GLUCOSE 120*  --  209* 295* 243* 193*  BUN 12  --  15 25* 22 23  CREATININE 0.53  --  0.64 0.97 0.80 0.75  CALCIUM 8.9  --  8.5* 8.7* 8.7* 8.6*  MG  --  1.6* 1.7  --   --  1.8  PHOS  --   --  3.0  --   --   --    GFR: CrCl cannot be calculated (Unknown ideal weight.). Liver Function Tests: Recent Labs  Lab 06/13/18 1754 06/15/18 0357  AST 17 13*  ALT 13 12  ALKPHOS 82 85  BILITOT 0.5 0.6  PROT 5.6* 5.4*  ALBUMIN 2.0* 2.0*   Recent Labs  Lab 06/13/18 1754  LIPASE 16   No results for input(s): AMMONIA in the last 168 hours. Coagulation Profile: No results for input(s): INR, PROTIME in the last 168 hours. Cardiac Enzymes: Recent Labs  Lab 06/13/18 2344 06/14/18 0938 06/14/18 1541  TROPONINI <0.03 <0.03 <0.03   BNP (last 3 results) No results for input(s): PROBNP in the last 8760 hours. HbA1C: Recent Labs    06/17/18 0428  HGBA1C 6.6*   CBG: Recent Labs  Lab 06/17/18 0803 06/17/18 1208 06/17/18 1621 06/17/18 2150 06/18/18 0714  GLUCAP 184* 251* 141* 234* 204*   Lipid Profile: No results for input(s): CHOL, HDL, LDLCALC, TRIG,  CHOLHDL, LDLDIRECT in the last 72 hours. Thyroid Function Tests: No results for input(s): TSH, T4TOTAL, FREET4, T3FREE, THYROIDAB in the last 72 hours. Anemia Panel: No results for input(s): VITAMINB12, FOLATE, FERRITIN, TIBC, IRON, RETICCTPCT in the last 72 hours. Sepsis Labs: Recent Labs  Lab 06/13/18 1738 06/13/18 2344  LATICACIDVEN 1.73 1.6    Recent Results (from the past 240 hour(s))  C difficile quick scan w PCR reflex     Status: None   Collection Time: 06/14/18 11:23 AM  Result Value Ref Range Status   C Diff antigen NEGATIVE NEGATIVE Final   C Diff toxin NEGATIVE NEGATIVE Final   C Diff interpretation No C. difficile detected.  Final    Comment: Performed at [facility], [place] [address], [place], [place] [address]  Gastrointestinal Panel  by PCR , Stool     Status: None   Collection Time: 06/14/18 11:23 AM  Result Value Ref Range Status   Campylobacter species NOT DETECTED NOT DETECTED Final   Plesimonas shigelloides NOT DETECTED NOT DETECTED Final   Salmonella species NOT DETECTED NOT DETECTED Final   Yersinia enterocolitica NOT DETECTED NOT DETECTED Final   Vibrio species NOT DETECTED NOT DETECTED Final   Vibrio cholerae NOT DETECTED NOT DETECTED Final   Enteroaggregative E coli (EAEC) NOT DETECTED NOT DETECTED Final   Enteropathogenic E coli (EPEC) NOT DETECTED NOT DETECTED Final   Enterotoxigenic E coli (ETEC) NOT DETECTED NOT DETECTED Final   Shiga like toxin producing E coli (STEC) NOT DETECTED NOT DETECTED Final   Shigella/Enteroinvasive E coli (EIEC) NOT DETECTED NOT DETECTED Final   Cryptosporidium NOT DETECTED NOT DETECTED Final   Cyclospora cayetanensis NOT DETECTED NOT DETECTED Final   Entamoeba histolytica NOT DETECTED NOT DETECTED Final   Giardia lamblia NOT DETECTED NOT DETECTED Final   Adenovirus F40/41 NOT DETECTED NOT DETECTED Final   Astrovirus NOT DETECTED NOT DETECTED Final   Norovirus GI/GII NOT DETECTED NOT DETECTED Final    Rotavirus A NOT DETECTED NOT DETECTED Final   Sapovirus (I, II, IV, and V) NOT DETECTED NOT DETECTED Final    Comment: Performed at [facility], [address] [place], [place] [address]  Calprotectin, Fecal     Status: Abnormal   Collection Time: 06/14/18 11:23 AM  Result Value Ref Range Status   Calprotectin, Fecal 1,239 (H) 0 - 120 ug/g Final    Comment: (NOTE) Concentration     Interpretation   Follow-Up <16 - 50 ug/g     Normal           None >50 -120 ug/g     Borderline       Re-evaluate in 4-6 weeks    >120 ug/g     Abnormal         Repeat as clinically                                   indicated Performed At: [facility] [place], [place] [id number] Rush Farmer MD YK:[id number]          Radiology Studies: No results found.      Scheduled Meds: . amLODipine  10 mg Oral Daily  . cholecalciferol  1,000 Units Oral Daily  . feeding supplement  1 Container Oral TID BM  . hydrocortisone   Rectal TID  . insulin aspart  0-5 Units Subcutaneous QHS  . insulin aspart  0-9 Units Subcutaneous TID WC  . mesalamine  2.4 g Oral Q breakfast  . methylPREDNISolone (SOLU-MEDROL) injection  60 mg Intravenous Q12H  . metoprolol tartrate  37.5 mg Oral Q6H  . sodium chloride flush  10-40 mL Intracatheter Q12H   Continuous Infusions: . sodium chloride Stopped (06/18/18 0404)     LOS: 2 days    Time spent: [place], MD [facility]  If 7PM-7AM, please contact night-coverage [email or web address] Password [email or web address] 06/18/2018, 10:09 AM

## 2018-06-18 NOTE — Progress Notes (Signed)
Physical Therapy Treatment Patient Details Name: Felicia Carlson MRN: [id number] DOB: 13-Nov-1924 Today's Date: 06/18/2018    History of Present Illness 84 yo female admitted with LGIB, chest pain, abnormal ECG. hx of breast ca, UC, CKD    PT Comments    Pt tolerated increased activity level today, she ambulated 77' with RW, no loss of balance. Distance limited by fatigue and back/abdominal pain, pain meds requested.   Follow Up Recommendations  Home health PT;Supervision/Assistance - 24 hour(pt stated she has assistance at home and plan is to return home)     Equipment Recommendations  None recommended by PT    Recommendations for Other Services       Precautions / Restrictions Precautions Precautions: Fall Restrictions Weight Bearing Restrictions: No    Mobility  Bed Mobility Overal bed mobility: Needs Assistance Bed Mobility: Supine to Sit     Supine to sit: HOB elevated;Min assist     General bed mobility comments: min A to scoot hips to edge of bed  Transfers Overall transfer level: Needs assistance Equipment used: Rolling walker (2 wheeled)(IV pole) Transfers: Sit to/from Stand Sit to Stand: Min guard         General transfer comment: VCs hand placement  Ambulation/Gait Ambulation/Gait assistance: Supervision Gait Distance (Feet): 45 Feet Assistive device: Rolling walker (2 wheeled) Gait Pattern/deviations: Step-through pattern;Trunk flexed Gait velocity: decr   General Gait Details: no loss of balance, VCs for posture, distance limited by fatigue   Stairs             Wheelchair Mobility    Modified Rankin (Stroke Patients Only)       Balance Overall balance assessment: Needs assistance   Sitting balance-Leahy Scale: Good     Standing balance support: Bilateral upper extremity supported Standing balance-Leahy Scale: Poor                              Cognition Arousal/Alertness: Awake/alert Behavior During Therapy:  WFL for tasks assessed/performed Overall Cognitive Status: Within Functional Limits for tasks assessed                                        Exercises      General Comments        Pertinent Vitals/Pain Pain Score: 7  Pain Location: abdomen and back Pain Descriptors / Indicators: Aching;Discomfort Pain Intervention(s): Limited activity within patient's tolerance;Monitored during session;Patient requesting pain meds-RN notified    Home Living                      Prior Function            PT Goals (current goals can now be found in the care plan section) Acute Rehab PT Goals Patient Stated Goal: home with kids to assist PT Goal Formulation: With patient/family Time For Goal Achievement: 06/28/18 Potential to Achieve Goals: Good Progress towards PT goals: Progressing toward goals    Frequency    Min 3X/week      PT Plan Current plan remains appropriate    Co-evaluation              AM-PAC PT "6 Clicks" Daily Activity  Outcome Measure  Difficulty turning over in bed (including adjusting bedclothes, sheets and blankets)?: A Little Difficulty moving from lying on back to sitting on the side of  the bed? : A Little Difficulty sitting down on and standing up from a chair with arms (e.g., wheelchair, bedside commode, etc,.)?: A Little Help needed moving to and from a bed to chair (including a wheelchair)?: A Little Help needed walking in hospital room?: A Little Help needed climbing 3-5 steps with a railing? : A Lot 6 Click Score: 17    End of Session Equipment Utilized During Treatment: Gait belt Activity Tolerance: Patient tolerated treatment well Patient left: with call bell/phone within reach;in chair;with family/visitor present Nurse Communication: Mobility status PT Visit Diagnosis: Muscle weakness (generalized) (M62.81);Difficulty in walking, not elsewhere classified (R26.2)     Time: 1470-9295 PT Time Calculation (min)  (ACUTE ONLY): 17 min  Charges:  $Gait Training: 8-22 mins                     Blondell Reveal Kistler PT 06/18/2018  Acute Rehabilitation Services Pager [phone] Office [phone]

## 2018-06-19 LAB — GLUCOSE, CAPILLARY
GLUCOSE-CAPILLARY: 310 mg/dL — AB (ref 70–99)
GLUCOSE-CAPILLARY: 196 mg/dL — AB (ref 70–99)
Glucose-Capillary: 160 mg/dL — ABNORMAL HIGH (ref 70–99)
Glucose-Capillary: 192 mg/dL — ABNORMAL HIGH (ref 70–99)

## 2018-06-19 MED ORDER — ENSURE ENLIVE PO LIQD
237.0000 mL | ORAL | Status: DC
  Administered 2018-06-19: 237 mL via ORAL

## 2018-06-19 MED ORDER — RIVAROXABAN 15 MG PO TABS
15.0000 mg | ORAL_TABLET | Freq: Every day | ORAL | Status: DC
  Administered 2018-06-19: 15 mg via ORAL
  Filled 2018-06-19: qty 1

## 2018-06-19 MED ORDER — BOOST / RESOURCE BREEZE PO LIQD CUSTOM
1.0000 | Freq: Two times a day (BID) | ORAL | Status: DC
  Administered 2018-06-19: 1 via ORAL

## 2018-06-19 MED ORDER — ADULT MULTIVITAMIN W/MINERALS CH
1.0000 | ORAL_TABLET | Freq: Every day | ORAL | Status: DC
  Administered 2018-06-19 – 2018-06-20 (×2): 1 via ORAL
  Filled 2018-06-19 (×2): qty 1

## 2018-06-19 MED ORDER — PREDNISONE 20 MG PO TABS
60.0000 mg | ORAL_TABLET | Freq: Every day | ORAL | Status: DC
  Administered 2018-06-20: 60 mg via ORAL
  Filled 2018-06-19: qty 3

## 2018-06-19 MED ORDER — METOPROLOL SUCCINATE ER 50 MG PO TB24
150.0000 mg | ORAL_TABLET | Freq: Every day | ORAL | Status: DC
  Administered 2018-06-19 – 2018-06-20 (×2): 150 mg via ORAL
  Filled 2018-06-19 (×2): qty 1

## 2018-06-19 NOTE — Progress Notes (Signed)
PROGRESS NOTE    Felicia Carlson  DPO:[id number] DOB: Aug 31, 1924 DOA: 06/13/2018 PCP: Rodena Medin, MD (Inactive)    Brief Narrative:  84 year old woman with past medical history relevant for ulcerative colitis followed at [facility], CKD unknown stage, hypertension, breast cancer status post chemoradiation in remission admitted with abdominal pain, hematochezia and CT findings concerning for rectosigmoid colitis concerning for UC flare.  Her hospital course has been comp gated by runs of atrial fibrillation with RVR.   Assessment & Plan:   Active Problems:   Lower GI bleed   Ulcerative colitis with complication (HCC)   Chest pain   Debility   Hypokalemia   Cardiac murmur   GIB (gastrointestinal bleeding)   #) Ulcerative colitis flare: Improving  .-  Discontinue IV methylprednisone 60 mg every 12, start prednisone 60 mg daily, will wean as outpatient - Gastroenterology consult [facility], signed off -C. difficile negative, GI panel negative -Continue mesalamine 2.4 g daily  #) Paroxysmal atrial fibrillation: Patient no longer has episodes of tachyarrhythmia while exerting herself. -Echo on 06/14/2018 shows only grade 1 diastolic dysfunction E -Consolidate metoprolol tartrate 37.5 mg every 6 hours to metoprolol succinate 150 mg daily -Telemetry  -Start rivaroxaban 15 mg daily for atrial fibrillation.  #) CKD: Patient carries a diagnosis however her creatinine here is normal.  It might be underestimating due to her frailty however it is not clear that the patient does have CKD.  #) Grade 1 diastolic dysfunction: Euvolemic  #) Hypertension: -Continue amlodipine 10 mg daily -Continue beta-blocker per above  Fluids: Tolerating p.o. Electrodes: Monitor and supplement Nutrition: Soft, bland diet  Prophylaxis: SCDs  Disposition: Pending improved abdominal pain  DO NOT RESUSCITATE   Consultants:   Gastroenterology  Procedures:  06/14/2018 echo: - Left  ventricle: The cavity size was normal. Wall thickness was   increased in a pattern of mild LVH. Systolic function was   vigorous. The estimated ejection fraction was in the range of 65%   to 70%. Wall motion was normal; there were no regional wall   motion abnormalities. Doppler parameters are consistent with   abnormal left ventricular relaxation (grade 1 diastolic   dysfunction). - Aortic valve: There was mild regurgitation. Valve area (VTI):   2.62 cm^2. Valve area (Vmax): 2.38 cm^2. Valve area (Vmean): 2.55   cm^2. - Pulmonary arteries: Systolic pressure was moderately increased.    PA peak pressure: 46 mm Hg (S).  Antimicrobials:   None   Subjective: Patient reports that she has plateaued.  She no longer has tenesmus or hematochezia.  She continues to have vague nonspecific abdominal pain as well as back pain.  She denies any nausea, vomiting, diarrhea, cough, congestion, rhinorrhea.  Objective: Vitals:   06/18/18 1436 06/18/18 2126 06/19/18 0512 06/19/18 1000  BP: (!) 119/40 (!) 105/52 (!) 109/53 (!) 150/66  Pulse: (!) 57 61 (!) 56 62  Resp: 18 18 18    Temp: 97.6 F (36.4 C) 97.9 F (36.6 C) 97.8 F (36.6 C) 97.6 F (36.4 C)  TempSrc: Oral Oral Oral Oral  SpO2: 98% 97% 98% 100%  Height:        Intake/Output Summary (Last 24 hours) at 06/19/2018 1026 Last data filed at 06/19/2018 1011 Gross per 24 hour  Intake 120 ml  Output -  Net 120 ml   There were no vitals filed for this visit.  Examination:  General exam: Younger than stated age, lying in bed, no acute distress Respiratory system: Clear to auscultation. Respiratory  effort normal. Cardiovascular system: Irregularly irregular, no murmurs,  Gastrointestinal system: Soft, hypoactive bowel sounds, minimal tender to palpation over suprapubic area and laterally Central nervous system: Alert and oriented.  Intact, moving all extremities Extremities: There is lower extremity edema. Skin: No rashes over visible  skin Psychiatry: Judgement and insight appear normal. Mood & affect appropriate.     Data Reviewed: I have personally reviewed following labs and imaging studies  CBC: Recent Labs  Lab 06/13/18 1754 06/13/18 2344 06/15/18 0357 06/16/18 0305 06/17/18 0428 06/18/18 0406  WBC 8.3 8.7 8.8 9.2 8.3 8.3  NEUTROABS 5.9  --   --   --   --   --   HGB 11.2* 11.4* 10.9* 10.1* 9.8* 9.4*  HCT 35.0* 35.8* 34.4* 31.9* 31.0* 29.5*  MCV 94.3 95.0 94.0 94.7 95.4 94.2  PLT 306 301 310 292 270 161   Basic Metabolic Panel: Recent Labs  Lab 06/13/18 1754 06/13/18 2344 06/15/18 0357 06/16/18 0305 06/17/18 0428 06/18/18 0406  NA 137  --  135 128* 131* 134*  K 3.4*  --  4.2 4.9 4.9 4.5  CL 103  --  103 97* 103 106  CO2 25  --  24 24 22 23   GLUCOSE 120*  --  209* 295* 243* 193*  BUN 12  --  15 25* 22 23  CREATININE 0.53  --  0.64 0.97 0.80 0.75  CALCIUM 8.9  --  8.5* 8.7* 8.7* 8.6*  MG  --  1.6* 1.7  --   --  1.8  PHOS  --   --  3.0  --   --   --    GFR: CrCl cannot be calculated (Unknown ideal weight.). Liver Function Tests: Recent Labs  Lab 06/13/18 1754 06/15/18 0357  AST 17 13*  ALT 13 12  ALKPHOS 82 85  BILITOT 0.5 0.6  PROT 5.6* 5.4*  ALBUMIN 2.0* 2.0*   Recent Labs  Lab 06/13/18 1754  LIPASE 16   No results for input(s): AMMONIA in the last 168 hours. Coagulation Profile: No results for input(s): INR, PROTIME in the last 168 hours. Cardiac Enzymes: Recent Labs  Lab 06/13/18 2344 06/14/18 0938 06/14/18 1541  TROPONINI <0.03 <0.03 <0.03   BNP (last 3 results) No results for input(s): PROBNP in the last 8760 hours. HbA1C: Recent Labs    06/17/18 0428  HGBA1C 6.6*   CBG: Recent Labs  Lab 06/18/18 0714 06/18/18 1139 06/18/18 1648 06/18/18 2123 06/19/18 0732  GLUCAP 204* 181* 218* 263* 160*   Lipid Profile: No results for input(s): CHOL, HDL, LDLCALC, TRIG, CHOLHDL, LDLDIRECT in the last 72 hours. Thyroid Function Tests: No results for input(s):  TSH, T4TOTAL, FREET4, T3FREE, THYROIDAB in the last 72 hours. Anemia Panel: No results for input(s): VITAMINB12, FOLATE, FERRITIN, TIBC, IRON, RETICCTPCT in the last 72 hours. Sepsis Labs: Recent Labs  Lab 06/13/18 1738 06/13/18 2344  LATICACIDVEN 1.73 1.6    Recent Results (from the past 240 hour(s))  C difficile quick scan w PCR reflex     Status: None   Collection Time: 06/14/18 11:23 AM  Result Value Ref Range Status   C Diff antigen NEGATIVE NEGATIVE Final   C Diff toxin NEGATIVE NEGATIVE Final   C Diff interpretation No C. difficile detected.  Final    Comment: Performed at [facility], [place] [address] [place], [place] [address]  Gastrointestinal Panel by PCR , Stool     Status: None   Collection Time: 06/14/18 11:23 AM  Result Value Ref Range Status   Campylobacter species NOT DETECTED NOT DETECTED Final   Plesimonas shigelloides NOT DETECTED NOT DETECTED Final   Salmonella species NOT DETECTED NOT DETECTED Final   Yersinia enterocolitica NOT DETECTED NOT DETECTED Final   Vibrio species NOT DETECTED NOT DETECTED Final   Vibrio cholerae NOT DETECTED NOT DETECTED Final   Enteroaggregative E coli (EAEC) NOT DETECTED NOT DETECTED Final   Enteropathogenic E coli (EPEC) NOT DETECTED NOT DETECTED Final   Enterotoxigenic E coli (ETEC) NOT DETECTED NOT DETECTED Final   Shiga like toxin producing E coli (STEC) NOT DETECTED NOT DETECTED Final   Shigella/Enteroinvasive E coli (EIEC) NOT DETECTED NOT DETECTED Final   Cryptosporidium NOT DETECTED NOT DETECTED Final   Cyclospora cayetanensis NOT DETECTED NOT DETECTED Final   Entamoeba histolytica NOT DETECTED NOT DETECTED Final   Giardia lamblia NOT DETECTED NOT DETECTED Final   Adenovirus F40/41 NOT DETECTED NOT DETECTED Final   Astrovirus NOT DETECTED NOT DETECTED Final   Norovirus GI/GII NOT DETECTED NOT DETECTED Final   Rotavirus A NOT DETECTED NOT DETECTED Final   Sapovirus (I, II, IV, and V) NOT DETECTED NOT  DETECTED Final    Comment: Performed at [facility], [place] [place],  [address]  Calprotectin, Fecal     Status: Abnormal   Collection Time: 06/14/18 11:23 AM  Result Value Ref Range Status   Calprotectin, Fecal 1,239 (H) 0 - 120 ug/g Final    Comment: (NOTE) Concentration     Interpretation   Follow-Up <16 - 50 ug/g     Normal           None >50 -120 ug/g     Borderline       Re-evaluate in 4-6 weeks    >120 ug/g     Abnormal         Repeat as clinically                                   indicated Performed At: [facility] [place], [place] [id number] Rush Farmer MD IT:[id number]          Radiology Studies: No results found.      Scheduled Meds: . amLODipine  10 mg Oral Daily  . cholecalciferol  1,000 Units Oral Daily  . feeding supplement  1 Container Oral TID BM  . hydrocortisone   Rectal TID  . insulin aspart  0-5 Units Subcutaneous QHS  . insulin aspart  0-9 Units Subcutaneous TID WC  . mesalamine  2.4 g Oral Q breakfast  . metoprolol tartrate  37.5 mg Oral Q6H  . [START ON 06/20/2018] predniSONE  60 mg Oral Q breakfast  . rivaroxaban  15 mg Oral Daily  . sodium chloride flush  10-40 mL Intracatheter Q12H   Continuous Infusions: . sodium chloride Stopped (06/18/18 0404)     LOS: 3 days    Time spent: 41    Cristy Folks, MD [facility]  If 7PM-7AM, please contact night-coverage [email or web address] Password [email or web address] 06/19/2018, 10:26 AM

## 2018-06-19 NOTE — Progress Notes (Signed)
Nutrition Follow-up  DOCUMENTATION CODES:   Not applicable  INTERVENTION:  - Continue Boost Breeze but will decrease to BID. - Will order Ensure Enlive once/day, this supplement provides 350 kcal and 20 grams of protein. - Will order daily multivitamin with minerals. - Weigh patient today. - Continue to encourage PO intakes.   NUTRITION DIAGNOSIS:   Inadequate oral intake related to other (see comment)(abdominal pain) as evidenced by per patient/family report, meal completion < 50%. -ongoing  GOAL:   Patient will meet greater than or equal to 90% of their needs -unmet on average  MONITOR:   PO intake, Supplement acceptance, Weight trends, Labs  ASSESSMENT:   84 year old woman with past medical history relevant for ulcerative colitis followed at [facility], CKD unknown stage, hypertension, breast cancer status post chemoradiation in remission admitted with abdominal pain, hematochezia and CT findings concerning for rectosigmoid colitis concerning for UC flare.   No weight available from this admission and last recorded weight in chart was from 06/06/18 at [facility] when patient weighed 61.8 kg/136 lb; used this weight to estimate nutrition needs at this time.  Per flow sheet review, patient has been eating <50% of meals on average. Lunch tray was delivered at the time of RD visit; RD helped patient cut up items and tech aided RD in pulling patient up in bed. Patient reports that she had sausage and breakfast potatoes for breakfast and flow sheet indicates that she consumed 60% of that meal.  Patient reports ongoing abdominal pain and pain associated with hemorrhoids. She denies hemorrhoids making her afraid/nervous to eat.     Medications reviewed; 1000 units vitamin D/day, sliding scale Novolog, 60 g Deltasone/day. Labs reviewed; CBGs: 160 and 310 mg/dL today, Na: 134 mmol/L, Ca: 8.6 mg/dL.        Diet Order:   Diet Order            DIET SOFT Room service  appropriate? No; Fluid consistency: Thin  Diet effective now              EDUCATION NEEDS:   No education needs have been identified at this time  Skin:  Skin Assessment: Reviewed RN Assessment  Last BM:  10/30  Height:   Ht Readings from Last 1 Encounters:  06/15/18 5\' 1"  (1.549 m)    Weight:   Wt Readings from Last 1 Encounters:  10/26/14 74.8 kg    Ideal Body Weight:  47.73 kg  BMI:  Body mass index is 31.18 kg/m.  Estimated Nutritional Needs:   Kcal:  1420-1610 (23-26 kcal/kg)  Protein:  50-60 grams  Fluid:  >/= 1.6 L/day     Jarome Matin, MS, RD, LDN, [facility] Inpatient Clinical Dietitian Pager # [phone] After hours/weekend pager # [phone]

## 2018-06-19 NOTE — Care Management Important Message (Signed)
Important Message  Patient Details  Name: FELEICA FULMORE MRN: [id number] Date of Birth: 03/28/1925   Medicare Important Message Given:  Yes    Kerin Salen 06/19/2018, 1:21 PMImportant Message  Patient Details  Name: NNEKA BLANDA MRN: [id number] Date of Birth: [birth year]   Medicare Important Message Given:  Yes    Kerin Salen 06/19/2018, 1:21 PM

## 2018-06-19 NOTE — Discharge Instructions (Signed)
Information on my medicine - XARELTO (Rivaroxaban)  This medication education was reviewed with me or my healthcare representative as part of my discharge preparation.  The pharmacist that spoke with me during my hospital stay was:  Hayli Milligan A, RPH  Why was Xarelto prescribed for you? Xarelto was prescribed for you to reduce the risk of a blood clot forming that can cause a stroke if you have a medical condition called atrial fibrillation (a type of irregular heartbeat).  What do you need to know about xarelto ? Take your Xarelto ONCE DAILY at the same time every day with your evening meal. If you have difficulty swallowing the tablet whole, you may crush it and mix in applesauce just prior to taking your dose.  Take Xarelto exactly as prescribed by your doctor and DO NOT stop taking Xarelto without talking to the doctor who prescribed the medication.  Stopping without other stroke prevention medication to take the place of Xarelto may increase your risk of developing a clot that causes a stroke.  Refill your prescription before you run out.  After discharge, you should have regular check-up appointments with your healthcare provider that is prescribing your Xarelto.  In the future your dose may need to be changed if your kidney function or weight changes by a significant amount.  What do you do if you miss a dose? If you are taking Xarelto ONCE DAILY and you miss a dose, take it as soon as you remember on the same day then continue your regularly scheduled once daily regimen the next day. Do not take two doses of Xarelto at the same time or on the same day.   Important Safety Information A possible side effect of Xarelto is bleeding. You should call your healthcare provider right away if you experience any of the following: ? Bleeding from an injury or your nose that does not stop. ? Unusual colored urine (red or dark brown) or unusual colored stools (red or black). ? Unusual  bruising for unknown reasons. ? A serious fall or if you hit your head (even if there is no bleeding).  Some medicines may interact with Xarelto and might increase your risk of bleeding while on Xarelto. To help avoid this, consult your healthcare provider or pharmacist prior to using any new prescription or non-prescription medications, including herbals, vitamins, non-steroidal anti-inflammatory drugs (NSAIDs) and supplements.  This website has more information on Xarelto: www.xarelto.com.   

## 2018-06-19 NOTE — Progress Notes (Signed)
Provided patient w/$30 day free xarelto coupon per pharmacy-they didn't have any. Afib-already on xarelto.

## 2018-06-20 LAB — GLUCOSE, CAPILLARY
GLUCOSE-CAPILLARY: 123 mg/dL — AB (ref 70–99)
GLUCOSE-CAPILLARY: 186 mg/dL — AB (ref 70–99)

## 2018-06-20 LAB — BASIC METABOLIC PANEL WITH GFR
Anion gap: 6 (ref 5–15)
BUN: 31 mg/dL — ABNORMAL HIGH (ref 8–23)
Chloride: 102 mmol/L (ref 98–111)
GFR calc non Af Amer: >60 mL/min (ref >60)
Potassium: 3.9 mmol/L (ref 3.5–5.1)
Sodium: 132 mmol/L — ABNORMAL LOW (ref 135–145)

## 2018-06-20 LAB — CBC
HCT: 33 % — ABNORMAL LOW (ref 36.0–46.0)
Hemoglobin: 10.8 g/dL — ABNORMAL LOW (ref 12.0–15.0)
MCH: 30.4 pg (ref 26.0–34.0)
MCHC: 32.7 g/dL (ref 30.0–36.0)
MCV: 93 fL (ref 80.0–100.0)
Platelets: 298 K/uL (ref 150–400)
RBC: 3.55 MIL/uL — ABNORMAL LOW (ref 3.87–5.11)
RDW: 12.8 % (ref 11.5–15.5)
WBC: 10 10*3/uL (ref 4.0–10.5)
nRBC: 0.2 % (ref 0.0–0.2)

## 2018-06-20 LAB — BASIC METABOLIC PANEL
CO2: 24 mmol/L (ref 22–32)
Calcium: 8.7 mg/dL — ABNORMAL LOW (ref 8.9–10.3)
Creatinine, Ser: 0.75 mg/dL (ref 0.44–1.00)
GFR calc Af Amer: >60 mL/min (ref >60)
Glucose, Bld: 168 mg/dL — ABNORMAL HIGH (ref 70–99)

## 2018-06-20 MED ORDER — RIVAROXABAN 15 MG PO TABS: 15.0000 mg | ORAL_TABLET | Freq: Every day | ORAL | 1 refills | Status: DC

## 2018-06-20 MED ORDER — PREDNISONE 10 MG PO TABS: ORAL_TABLET | ORAL | 0 refills | Status: DC

## 2018-06-20 MED ORDER — METOPROLOL SUCCINATE ER 50 MG PO TB24: 150.0000 mg | ORAL_TABLET | Freq: Every day | ORAL | 0 refills | Status: AC

## 2018-06-20 NOTE — Progress Notes (Signed)
Physical Therapy Treatment Patient Details Name: Felicia Carlson MRN: [id number] DOB: 1925-01-21 Today's Date: 06/20/2018    History of Present Illness 84 yo female admitted with LGIB, chest pain, abnormal ECG. hx of breast ca, UC, CKD    PT Comments    Pt assisted to [facility] per her request and then reported nausea.  Pt felt unable to tolerate further mobility at this time.  RN notified.  Follow Up Recommendations  Home health PT;Supervision/Assistance - 24 hour     Equipment Recommendations  None recommended by PT    Recommendations for Other Services       Precautions / Restrictions Precautions Precautions: Fall Precaution Comments: bloody drainage from rectum    Mobility  Bed Mobility Overal bed mobility: Needs Assistance Bed Mobility: Supine to Sit     Supine to sit: HOB elevated;Min guard Sit to supine: Min guard;HOB elevated   General bed mobility comments: min/guard for safety, increased time and effort  Transfers Overall transfer level: Needs assistance Equipment used: None Transfers: Sit to/from [facility] Sit to Stand: Min guard Stand pivot transfers: Min guard       General transfer comment: pt requested to use BSC so assisted OOB; pt required HHA for back to bed, pt reports nausea limiting mobility at this time so she declined OOB to recliner and ambulating  Ambulation/Gait                 Stairs             Wheelchair Mobility    Modified Rankin (Stroke Patients Only)       Balance                                            Cognition Arousal/Alertness: Awake/alert Behavior During Therapy: WFL for tasks assessed/performed Overall Cognitive Status: Within Functional Limits for tasks assessed                                        Exercises      General Comments        Pertinent Vitals/Pain Pain Assessment: (only c/o nausea, RN notified)    Home Living                      Prior Function            PT Goals (current goals can now be found in the care plan section) Progress towards PT goals: Progressing toward goals    Frequency    Min 3X/week      PT Plan Current plan remains appropriate    Co-evaluation              AM-PAC PT "6 Clicks" Daily Activity  Outcome Measure  Difficulty turning over in bed (including adjusting bedclothes, sheets and blankets)?: A Little Difficulty moving from lying on back to sitting on the side of the bed? : A Little Difficulty sitting down on and standing up from a chair with arms (e.g., wheelchair, bedside commode, etc,.)?: A Little Help needed moving to and from a bed to chair (including a wheelchair)?: A Little Help needed walking in hospital room?: A Little Help needed climbing 3-5 steps with a railing? : A Lot 6 Click Score: 17  End of Session Equipment Utilized During Treatment: Gait belt Activity Tolerance: Patient limited by fatigue Patient left: with call bell/phone within reach;in bed;with bed alarm set Nurse Communication: Mobility status(pt with nausea) PT Visit Diagnosis: Muscle weakness (generalized) (M62.81);Difficulty in walking, not elsewhere classified (R26.2)     Time: 4193-7902 PT Time Calculation (min) (ACUTE ONLY): 13 min  Charges:  $Therapeutic Activity: 8-22 mins                    Carmelia Bake, PT, DPT Acute Rehabilitation Services Office: [phone] Pager: [phone]  York Ram E 06/20/2018, 1:00 PM

## 2018-06-20 NOTE — Progress Notes (Signed)
Occupational Therapy Treatment Patient Details Name: Felicia Carlson MRN: [id number] DOB: Sep 24, 1924 Today's Date: 06/20/2018    History of present illness 84 yo female admitted with LGIB, chest pain, abnormal ECG. hx of breast ca, UC, CKD   OT comments  Pt steady walking to bathroom with RW.  Performed oral care from standing and got up to chair.  Pt plans d/c home today  Follow Up Recommendations  Home health OT;Supervision/Assistance - 24 hour    Equipment Recommendations  None recommended by OT    Recommendations for Other Services      Precautions / Restrictions Precautions Precautions: Fall        Mobility Bed Mobility Overal bed mobility: Needs Assistance Bed Mobility: Supine to Sit     Supine to sit: HOB elevated;Min guard Sit to supine: Supervision   General bed mobility comments: min/guard for safety, increased time and effort  Transfers Overall transfer level: Needs assistance Equipment used: Rolling walker (2 wheeled) Transfers: Sit to/from [facility] Sit to Stand: Supervision            Balance                                           ADL either performed or assessed with clinical judgement   ADL       Grooming: Oral care;Supervision/safety;Standing                   Toilet Transfer: Min guard;Ambulation;RW;BSC             General ADL Comments: Pt initially dizzy when sitting up.  PICC removed.  BP taken on LLE:  WFLs; see vitals.  Pt is planning home today. She had already bathed but wanted to wait for daughter to arrive to get dressed. Reviewed energy conservation     Vision       Perception     Praxis      Cognition Arousal/Alertness: Awake/alert Behavior During Therapy: WFL for tasks assessed/performed Overall Cognitive Status: Within Functional Limits for tasks assessed                                          Exercises     Shoulder Instructions        General Comments      Pertinent Vitals/ Pain       Pain Assessment: No/denies pain  Home Living                                          Prior Functioning/Environment              Frequency           Progress Toward Goals  OT Goals(current goals can now be found in the care plan section)  Progress towards OT goals: Progressing toward goals     Plan      Co-evaluation                 AM-PAC PT "6 Clicks" Daily Activity     Outcome Measure   Help from another person eating meals?: None Help from another person taking care of personal grooming?: A Little Help  from another person toileting, which includes using toliet, bedpan, or urinal?: A Little Help from another person bathing (including washing, rinsing, drying)?: A Little Help from another person to put on and taking off regular upper body clothing?: A Little Help from another person to put on and taking off regular lower body clothing?: A Lot 6 Click Score: 18    End of Session    OT Visit Diagnosis: Unsteadiness on feet (R26.81);Other abnormalities of gait and mobility (R26.89);Pain   Activity Tolerance Patient tolerated treatment well   Patient Left in chair;with call bell/phone within reach;with chair alarm set   Nurse Communication          Time: [id number] OT Time Calculation (min): 22 min  Charges: OT General Charges $OT Visit: 1 Visit OT Treatments $Self Care/Home Management : 8-22 mins  Lesle Chris, OTR/L Acute Rehabilitation Services [phone] [facility] pager [phone] office 06/20/2018   Asuncion Shibata 06/20/2018, 1:44 PM

## 2018-06-20 NOTE — Discharge Summary (Signed)
Physician Discharge Summary  Felicia Carlson:[id number] DOB: [birth year] DOA: 06/13/2018  PCP: Rodena Medin, MD (Inactive)  Admit date: 06/13/2018 Discharge date: 06/20/2018  Admitted From: Home Disposition: Home  Recommendations for Outpatient Follow-up:  1. Follow up with PCP in 1-2 weeks 2. Please obtain BMP/CBC in one week   Home Health: Yes Home health PT Equipment/Devices: None  Discharge Condition: Stable CODE STATUS: DNR Diet recommendation: Regular  Brief/Interim Summary:  #) Hematochezia due to ulcerative colitis flare: Patient was admitted with hematochezia and abdominal pain.  C. difficile and GI panel were negative.  Patient was started on IV steroids and her diet was advanced.  She was tolerating a soft bland diet and transition to oral steroids with an extensive outpatient taper.  [facility] gastroenterology was consulted.  She was continued on her home mesalamine.  #) Paroxysmal atrial fibrillation: Patient had runs of atrial fibrillation during this hospitalization.  She has a known history of atrial fibrillation but was not on anticoagulation.  Patient had an echo on 06/14/2018 that was unremarkable.  Anticoagulation was discussed with the patient and she opted to started after hematocrit her hematochezia resolved.  She was started on rivaroxaban which had been on prior.  She was started on beta-blockers and this was uptitrated until she was discharged on a once a day dose of metoprolol succinate.  #) Grade 1 diastolic dysfunction: Patient's home furosemide was held.  She was given gentle IV fluids for 2 days.  She may restart her home diuretics on discharge.  #) Hypertension: Patient was continued on home amlodipine.  Beta-blocker was added for continued hypertension and tachyarrhythmia per above.  Discharge Diagnoses:  Active Problems:   Lower GI bleed   Ulcerative colitis with complication (HCC)   Chest pain   Debility   Hypokalemia   Cardiac murmur   GIB  (gastrointestinal bleeding)    Discharge Instructions  Discharge Instructions    Diet - low sodium heart healthy   Complete by:  As directed    Increase activity slowly   Complete by:  As directed      Allergies as of 06/20/2018      Reactions   Isovue [iopamidol] Hives, Rash   Pt  Had hives, rash and erythema her chest, face and neck.  Slight eye swelling.  Pt given 50 mg po benadryl.  Pt also developed severe abd pain during contrast injection and continued to get worse.  For those reasons the radiologist had the pt transported by EMS to [facility].  Pt needs full premeds in the future.  J Bohm  RTRCT   Tetanus Immune Globulin Swelling      Medication List    TAKE these medications   ALOE VERA PO Take 1 tablet by mouth daily.   amLODipine 10 MG tablet Commonly known as:  NORVASC Take 10 mg by mouth daily.   APPLE CIDER VINEGAR PO Take 1 tablet by mouth daily.   furosemide 40 MG tablet Commonly known as:  LASIX Take 40 mg by mouth daily.   HAWTHORNE BERRY PO Take 1 tablet by mouth daily.   meloxicam 15 MG tablet Commonly known as:  MOBIC Take 15 mg by mouth daily.   mesalamine 1.2 g EC tablet Commonly known as:  LIALDA Take 2.4 g by mouth daily with breakfast.   metoprolol succinate 50 MG 24 hr tablet Commonly known as:  TOPROL-XL Take 3 tablets (150 mg total) by mouth daily. Start taking on:  06/21/2018   oxyCODONE-acetaminophen 5-325  MG tablet Commonly known as:  PERCOCET/ROXICET Take 1-2 tablets by mouth every 4 (four) hours as needed.   predniSONE 10 MG tablet Commonly known as:  DELTASONE Take 4 tablets (40 mg total) by mouth daily with breakfast for 5 days, THEN 2 tablets (20 mg total) daily with breakfast for 5 days, THEN 1 tablet (10 mg total) daily with breakfast for 5 days, THEN 0.5 tablets (5 mg total) daily with breakfast for 14 days. Start taking on:  06/21/2018   Rivaroxaban 15 MG Tabs tablet Commonly known as:  XARELTO Take 1 tablet (15 mg total)  by mouth daily with supper. What changed:    medication strength  how much to take  when to take this   VITAMIN D-1000 MAX ST 1000 units tablet Generic drug:  Cholecalciferol Take 1,000 Units by mouth daily.      Follow-up Information    [facility] Follow [facility].   Specialty:  Home Health Services Why:  [facility] physical therapy/occupational therapy Contact information: [place] [address] [phone]          Allergies  Allergen Reactions  . Isovue [Iopamidol] Hives and Rash    Pt  Had hives, rash and erythema her chest, face and neck.  Slight eye swelling.  Pt given 50 mg po benadryl.  Pt also developed severe abd pain during contrast injection and continued to get worse.  For those reasons the radiologist had the pt transported by EMS to [facility].  Pt needs full premeds in the future.  J Bohm  RTRCT  . Tetanus Immune Globulin Swelling    Consultations:  [facility]   Procedures/Studies: Ct Abdomen Pelvis Wo Contrast  Result Date: 06/13/2018 CLINICAL DATA:  generalized abdominal pain, greatest over right abdomen, diarrhea for 2-3 weeks. Previous appendectomy, hysterectomy and RIGHT breast lumpectomy. EXAM: CT ABDOMEN AND PELVIS WITHOUT CONTRAST TECHNIQUE: Multidetector CT imaging of the abdomen and pelvis was performed following the standard protocol without IV contrast. COMPARISON:  CT of the abdomen and pelvis on 09/26/2017 FINDINGS: Lower chest: There is subsegmental atelectasis at the lung bases. Coronary artery calcifications are present. Hepatobiliary: There is a stable 11 millimeter probable cyst within the LEFT hepatic lobe. Gallbladder is present. There are dependent partially calcified gallstones without CT evidence for acute cholecystitis. Pancreas: Unremarkable. No pancreatic ductal dilatation or surrounding inflammatory changes. Spleen: Calcified splenic granulomata. Otherwise the spleen is normal in appearance.  Adrenals/Urinary Tract: Normal adrenal glands. Stable cyst within the UPPER pole of the LEFT kidney. There are punctate intrarenal calcifications bilaterally. No hydronephrosis. No ureteral obstruction. The bladder and visualized portion of the urethra are normal. Stomach/Bowel: The stomach and small bowel loops are normal in appearance. There is a large amount of stool particularly within the RIGHT colon. There is dilatation and wall thickening of the sigmoid colon. The wall thickening continues to level of the anus. There are scattered diverticula within this region the appearance favors inflammatory, ischemic, or infectious colitis over acute diverticulitis. Although thickening of the colonic wall throughout the sigmoid colon and rectum has been demonstrated on prior exams, the dilatation of this segment appears new or different. Mesenteric edema is noted in the pelvis. Vascular/Lymphatic: Small mesenteric lymph nodes are identified in the pelvis. No significant retroperitoneal adenopathy. There is significant atherosclerotic calcification of the abdominal aorta not associated aneurysm. Patient has a RIGHT renal artery stent. Reproductive: Hysterectomy.  No adnexal mass. Other: No free pelvic fluid. Anterior abdominal wall is unremarkable. Musculoskeletal: There  are degenerative changes in the LOWER thoracic spine and lumbar spine. No suspicious lytic or blastic lesions are identified. Stable small bone islands are identified in the LEFT ilium and RIGHT femoral head. IMPRESSION: 1. Significant stool within the RIGHT aspect of the colon. 2. Colonic diverticulosis. 3. Dilatation and wall thickening of the sigmoid colon which extends to the rectum. Associated mesenteric edema. Small mesocolon lymph nodes are also present. Findings favor infectious/inflammatory or ischemic colitis over diverticulitis. 4. Nonobstructing nephrolithiasis. 5. Splenic granulomata. 6. Coronary artery disease. 7.  Aortic atherosclerosis.   (ICD10-I70.0) 8. Hysterectomy. Electronically Signed   By: Nolon Nations M.D.   On: 06/13/2018 20:45   [place] Ekg Site Rite  Result Date: 06/14/2018 If Site Rite image not attached, placement could not be confirmed due to current cardiac rhythm.   06/14/2018 echo: - Left ventricle: The cavity size was normal. Wall thickness was increased in a pattern of mild LVH. Systolic function was vigorous. The estimated ejection fraction was in the range of 65% to 70%. Wall motion was normal; there were no regional wall motion abnormalities. Doppler parameters are consistent with abnormal left ventricular relaxation (grade 1 diastolic dysfunction). - Aortic valve: There was mild regurgitation. Valve area (VTI): 2.62 cm^2. Valve area (Vmax): 2.38 cm^2. Valve area (Vmean): 2.55 cm^2. - Pulmonary arteries: Systolic pressure was moderately increased.  PA peak pressure: 46 mm Hg (S).   Subjective:   Discharge Exam: Vitals:   06/20/18 0451 06/20/18 0939  BP: (!) 112/55   Pulse: (!) 58 61  Resp: 18   Temp: 98 F (36.7 C)   SpO2: 98%    Vitals:   06/19/18 1429 06/19/18 2059 06/20/18 0451 06/20/18 0939  BP: (!) 103/57 (!) 91/50 (!) 112/55   Pulse: 91 64 (!) 58 61  Resp:  18 18   Temp: 98 F (36.7 C) 98.8 F (37.1 C) 98 F (36.7 C)   TempSrc: Oral Oral Oral   SpO2: 95% 97% 98%   Weight:      Height:       General exam: Younger than stated age, lying in bed, no acute distress Respiratory system: Clear to auscultation. Respiratory effort normal. Cardiovascular system: Irregularly irregular, no murmurs,  Gastrointestinal system: Soft, hypoactive bowel sounds, minimal tender to palpation over suprapubic area and laterally Central nervous system: Alert and oriented.  Intact, moving all extremities Extremities: There is lower extremity edema. Skin: No rashes over visible skin Psychiatry: Judgement and insight appear normal. Mood & affect appropriate.     The  results of significant diagnostics from this hospitalization (including imaging, microbiology, ancillary and laboratory) are listed below for reference.     Microbiology: Recent Results (from the past 240 hour(s))  C difficile quick scan w PCR reflex     Status: None   Collection Time: 06/14/18 11:23 AM  Result Value Ref Range Status   C Diff antigen NEGATIVE NEGATIVE Final   C Diff toxin NEGATIVE NEGATIVE Final   C Diff interpretation No C. difficile detected.  Final    Comment: Performed at [facility], [place] [address], [place], [place] [address]  Gastrointestinal Panel by PCR , Stool     Status: None   Collection Time: 06/14/18 11:23 AM  Result Value Ref Range Status   Campylobacter species NOT DETECTED NOT DETECTED Final   Plesimonas shigelloides NOT DETECTED NOT DETECTED Final   Salmonella species NOT DETECTED NOT DETECTED Final   Yersinia enterocolitica NOT DETECTED NOT DETECTED Final   Vibrio  species NOT DETECTED NOT DETECTED Final   Vibrio cholerae NOT DETECTED NOT DETECTED Final   Enteroaggregative E coli (EAEC) NOT DETECTED NOT DETECTED Final   Enteropathogenic E coli (EPEC) NOT DETECTED NOT DETECTED Final   Enterotoxigenic E coli (ETEC) NOT DETECTED NOT DETECTED Final   Shiga like toxin producing E coli (STEC) NOT DETECTED NOT DETECTED Final   Shigella/Enteroinvasive E coli (EIEC) NOT DETECTED NOT DETECTED Final   Cryptosporidium NOT DETECTED NOT DETECTED Final   Cyclospora cayetanensis NOT DETECTED NOT DETECTED Final   Entamoeba histolytica NOT DETECTED NOT DETECTED Final   Giardia lamblia NOT DETECTED NOT DETECTED Final   Adenovirus F40/41 NOT DETECTED NOT DETECTED Final   Astrovirus NOT DETECTED NOT DETECTED Final   Norovirus GI/GII NOT DETECTED NOT DETECTED Final   Rotavirus A NOT DETECTED NOT DETECTED Final   Sapovirus (I, II, IV, and V) NOT DETECTED NOT DETECTED Final    Comment: Performed at [facility], [address]  [place], [place] [address]  Calprotectin, Fecal     Status: Abnormal   Collection Time: 06/14/18 11:23 AM  Result Value Ref Range Status   Calprotectin, Fecal 1,239 (H) 0 - 120 ug/g Final    Comment: (NOTE) Concentration     Interpretation   Follow-Up <16 - 50 ug/g     Normal           None >50 -120 ug/g     Borderline       Re-evaluate in 4-6 weeks    >120 ug/g     Abnormal         Repeat as clinically                                   indicated Performed At: [facility] [place], [place] [id number] Rush Farmer MD [id number]      Labs: BNP (last 3 results) No results for input(s): BNP in the last 8760 hours. Basic Metabolic Panel: Recent Labs  Lab 06/13/18 2344 06/15/18 0357 06/16/18 0305 06/17/18 0428 06/18/18 0406 06/20/18 0437  NA  --  135 128* 131* 134* 132*  K  --  4.2 4.9 4.9 4.5 3.9  CL  --  103 97* 103 106 102  CO2  --  24 24 22 23 24   GLUCOSE  --  209* 295* 243* 193* 168*  BUN  --  15 25* 22 23 31*  CREATININE  --  0.64 0.97 0.80 0.75 0.75  CALCIUM  --  8.5* 8.7* 8.7* 8.6* 8.7*  MG 1.6* 1.7  --   --  1.8  --   PHOS  --  3.0  --   --   --   --    Liver Function Tests: Recent Labs  Lab 06/13/18 1754 06/15/18 0357  AST 17 13*  ALT 13 12  ALKPHOS 82 85  BILITOT 0.5 0.6  PROT 5.6* 5.4*  ALBUMIN 2.0* 2.0*   Recent Labs  Lab 06/13/18 1754  LIPASE 16   No results for input(s): AMMONIA in the last 168 hours. CBC: Recent Labs  Lab 06/13/18 1754  06/15/18 0357 06/16/18 0305 06/17/18 0428 06/18/18 0406 06/20/18 0437  WBC 8.3   < > 8.8 9.2 8.3 8.3 10.0  NEUTROABS 5.9  --   --   --   --   --   --   HGB 11.2*   < > 10.9* 10.1*  9.8* 9.4* 10.8*  HCT 35.0*   < > 34.4* 31.9* 31.0* 29.5* 33.0*  MCV 94.3   < > 94.0 94.7 95.4 94.2 93.0  PLT 306   < > 310 292 270 257 298   < > = values in this interval not displayed.   Cardiac Enzymes: Recent Labs  Lab 06/13/18 2344 06/14/18 0938 06/14/18 1541  TROPONINI <0.03 <0.03 <0.03    BNP: Invalid input(s): POCBNP CBG: Recent Labs  Lab 06/19/18 0732 06/19/18 1202 06/19/18 1657 06/19/18 2057 06/20/18 0753  GLUCAP 160* 310* 192* 196* 123*   D-Dimer No results for input(s): DDIMER in the last 72 hours. Hgb A1c No results for input(s): HGBA1C in the last 72 hours. Lipid Profile No results for input(s): CHOL, HDL, LDLCALC, TRIG, CHOLHDL, LDLDIRECT in the last 72 hours. Thyroid function studies No results for input(s): TSH, T4TOTAL, T3FREE, THYROIDAB in the last 72 hours.  Invalid input(s): FREET3 Anemia work up No results for input(s): VITAMINB12, FOLATE, FERRITIN, TIBC, IRON, RETICCTPCT in the last 72 hours. Urinalysis    Component Value Date/Time   COLORURINE YELLOW 11/02/2010 0300   APPEARANCEUR CLEAR 11/02/2010 0300   LABSPEC 1.012 11/02/2010 0300   PHURINE 5.0 11/02/2010 0300   GLUCOSEU NEGATIVE 11/02/2010 0300   HGBUR NEGATIVE 11/02/2010 0300   BILIRUBINUR NEGATIVE 11/02/2010 0300   KETONESUR NEGATIVE 11/02/2010 0300   PROTEINUR NEGATIVE 11/02/2010 0300   UROBILINOGEN 0.2 11/02/2010 0300   NITRITE NEGATIVE 11/02/2010 0300   LEUKOCYTESUR  11/02/2010 0300    NEGATIVE MICROSCOPIC NOT DONE ON URINES WITH NEGATIVE PROTEIN, BLOOD, LEUKOCYTES, NITRITE, OR GLUCOSE <1000 mg/dL.   Sepsis Labs Invalid input(s): PROCALCITONIN,  WBC,  LACTICIDVEN Microbiology Recent Results (from the past 240 hour(s))  C difficile quick scan w PCR reflex     Status: None   Collection Time: 06/14/18 11:23 AM  Result Value Ref Range Status   C Diff antigen NEGATIVE NEGATIVE Final   C Diff toxin NEGATIVE NEGATIVE Final   C Diff interpretation No C. difficile detected.  Final    Comment: Performed at [facility], [place] [address], [place], [place] [address]  Gastrointestinal Panel by PCR , Stool     Status: None   Collection Time: 06/14/18 11:23 AM  Result Value Ref Range Status   Campylobacter species NOT DETECTED NOT DETECTED Final   Plesimonas  shigelloides NOT DETECTED NOT DETECTED Final   Salmonella species NOT DETECTED NOT DETECTED Final   Yersinia enterocolitica NOT DETECTED NOT DETECTED Final   Vibrio species NOT DETECTED NOT DETECTED Final   Vibrio cholerae NOT DETECTED NOT DETECTED Final   Enteroaggregative E coli (EAEC) NOT DETECTED NOT DETECTED Final   Enteropathogenic E coli (EPEC) NOT DETECTED NOT DETECTED Final   Enterotoxigenic E coli (ETEC) NOT DETECTED NOT DETECTED Final   Shiga like toxin producing E coli (STEC) NOT DETECTED NOT DETECTED Final   Shigella/Enteroinvasive E coli (EIEC) NOT DETECTED NOT DETECTED Final   Cryptosporidium NOT DETECTED NOT DETECTED Final   Cyclospora cayetanensis NOT DETECTED NOT DETECTED Final   Entamoeba histolytica NOT DETECTED NOT DETECTED Final   Giardia lamblia NOT DETECTED NOT DETECTED Final   Adenovirus F40/41 NOT DETECTED NOT DETECTED Final   Astrovirus NOT DETECTED NOT DETECTED Final   Norovirus GI/GII NOT DETECTED NOT DETECTED Final   Rotavirus A NOT DETECTED NOT DETECTED Final   Sapovirus (I, II, IV, and V) NOT DETECTED NOT DETECTED Final    Comment: Performed at [facility], [address] [place], [place] [address]  Calprotectin, Fecal     Status: Abnormal   Collection Time: 06/14/18 11:23 AM  Result Value Ref Range Status   Calprotectin, Fecal 1,239 (H) 0 - 120 ug/g Final    Comment: (NOTE) Concentration     Interpretation   Follow-Up <16 - 50 ug/g     Normal           None >50 -120 ug/g     Borderline       Re-evaluate in 4-6 weeks    >120 ug/g     Abnormal         Repeat as clinically                                   indicated Performed At: [facility] [place], [place] [id number] Rush Farmer MD LY:[id number]      Time coordinating discharge: 60  SIGNED:   Cristy Folks, MD  [facility] 06/20/2018, 10:46 AM   If 7PM-7AM, please contact night-coverage [email or web address] Password [email or web address]

## 2018-06-25 ENCOUNTER — Other Ambulatory Visit: Payer: Self-pay

## 2018-06-25 ENCOUNTER — Inpatient Hospital Stay (HOSPITAL_COMMUNITY)
Admission: EM | Admit: 2018-06-25 | Discharge: 2018-07-17 | DRG: 329 | Disposition: A | Discharge status: Hospice | Payer: Medicare HMO | Attending: Family Medicine | Admitting: Family Medicine

## 2018-06-25 ENCOUNTER — Emergency Department (HOSPITAL_COMMUNITY): Payer: Medicare HMO

## 2018-06-25 ENCOUNTER — Encounter (HOSPITAL_COMMUNITY): Payer: Self-pay | Admitting: Emergency Medicine

## 2018-06-25 DIAGNOSIS — K659 Peritonitis, unspecified: Secondary | ICD-10-CM

## 2018-06-25 DIAGNOSIS — K529 Noninfective gastroenteritis and colitis, unspecified: Secondary | ICD-10-CM | POA: Diagnosis not present

## 2018-06-25 DIAGNOSIS — E871 Hypo-osmolality and hyponatremia: Secondary | ICD-10-CM | POA: Diagnosis not present

## 2018-06-25 DIAGNOSIS — R5381 Other malaise: Secondary | ICD-10-CM | POA: Diagnosis not present

## 2018-06-25 DIAGNOSIS — E43 Unspecified severe protein-calorie malnutrition: Secondary | ICD-10-CM | POA: Diagnosis present

## 2018-06-25 DIAGNOSIS — K648 Other hemorrhoids: Secondary | ICD-10-CM | POA: Diagnosis present

## 2018-06-25 DIAGNOSIS — Z978 Presence of other specified devices: Secondary | ICD-10-CM

## 2018-06-25 DIAGNOSIS — J90 Pleural effusion, not elsewhere classified: Secondary | ICD-10-CM | POA: Diagnosis present

## 2018-06-25 DIAGNOSIS — K658 Other peritonitis: Secondary | ICD-10-CM | POA: Diagnosis present

## 2018-06-25 DIAGNOSIS — N823 Fistula of vagina to large intestine: Secondary | ICD-10-CM | POA: Diagnosis present

## 2018-06-25 DIAGNOSIS — K5900 Constipation, unspecified: Secondary | ICD-10-CM

## 2018-06-25 DIAGNOSIS — A419 Sepsis, unspecified organism: Secondary | ICD-10-CM

## 2018-06-25 DIAGNOSIS — Z9221 Personal history of antineoplastic chemotherapy: Secondary | ICD-10-CM

## 2018-06-25 DIAGNOSIS — Z90710 Acquired absence of both cervix and uterus: Secondary | ICD-10-CM | POA: Diagnosis not present

## 2018-06-25 DIAGNOSIS — K51019 Ulcerative (chronic) pancolitis with unspecified complications: Secondary | ICD-10-CM | POA: Diagnosis not present

## 2018-06-25 DIAGNOSIS — G8929 Other chronic pain: Secondary | ICD-10-CM | POA: Diagnosis present

## 2018-06-25 DIAGNOSIS — Z515 Encounter for palliative care: Secondary | ICD-10-CM

## 2018-06-25 DIAGNOSIS — K219 Gastro-esophageal reflux disease without esophagitis: Secondary | ICD-10-CM | POA: Diagnosis present

## 2018-06-25 DIAGNOSIS — Z923 Personal history of irradiation: Secondary | ICD-10-CM

## 2018-06-25 DIAGNOSIS — Z96651 Presence of right artificial knee joint: Secondary | ICD-10-CM | POA: Diagnosis present

## 2018-06-25 DIAGNOSIS — I10 Essential (primary) hypertension: Secondary | ICD-10-CM | POA: Diagnosis present

## 2018-06-25 DIAGNOSIS — D649 Anemia, unspecified: Secondary | ICD-10-CM | POA: Diagnosis present

## 2018-06-25 DIAGNOSIS — J9601 Acute respiratory failure with hypoxia: Secondary | ICD-10-CM | POA: Diagnosis not present

## 2018-06-25 DIAGNOSIS — K922 Gastrointestinal hemorrhage, unspecified: Secondary | ICD-10-CM | POA: Diagnosis present

## 2018-06-25 DIAGNOSIS — R109 Unspecified abdominal pain: Secondary | ICD-10-CM | POA: Diagnosis present

## 2018-06-25 DIAGNOSIS — K668 Other specified disorders of peritoneum: Secondary | ICD-10-CM

## 2018-06-25 DIAGNOSIS — K625 Hemorrhage of anus and rectum: Secondary | ICD-10-CM | POA: Diagnosis not present

## 2018-06-25 DIAGNOSIS — K51511 Left sided colitis with rectal bleeding: Principal | ICD-10-CM | POA: Diagnosis present

## 2018-06-25 DIAGNOSIS — E872 Acidosis: Secondary | ICD-10-CM | POA: Diagnosis present

## 2018-06-25 DIAGNOSIS — M533 Sacrococcygeal disorders, not elsewhere classified: Secondary | ICD-10-CM | POA: Diagnosis present

## 2018-06-25 DIAGNOSIS — Z66 Do not resuscitate: Secondary | ICD-10-CM | POA: Diagnosis present

## 2018-06-25 DIAGNOSIS — Z452 Encounter for adjustment and management of vascular access device: Secondary | ICD-10-CM

## 2018-06-25 DIAGNOSIS — Z853 Personal history of malignant neoplasm of breast: Secondary | ICD-10-CM

## 2018-06-25 DIAGNOSIS — F05 Delirium due to known physiological condition: Secondary | ICD-10-CM | POA: Diagnosis not present

## 2018-06-25 DIAGNOSIS — K631 Perforation of intestine (nontraumatic): Secondary | ICD-10-CM

## 2018-06-25 DIAGNOSIS — E860 Dehydration: Secondary | ICD-10-CM | POA: Diagnosis present

## 2018-06-25 DIAGNOSIS — Z9119 Patient's noncompliance with other medical treatment and regimen: Secondary | ICD-10-CM

## 2018-06-25 DIAGNOSIS — Z7901 Long term (current) use of anticoagulants: Secondary | ICD-10-CM | POA: Diagnosis not present

## 2018-06-25 DIAGNOSIS — T380X5A Adverse effect of glucocorticoids and synthetic analogues, initial encounter: Secondary | ICD-10-CM | POA: Diagnosis not present

## 2018-06-25 DIAGNOSIS — E873 Alkalosis: Secondary | ICD-10-CM | POA: Diagnosis present

## 2018-06-25 DIAGNOSIS — R739 Hyperglycemia, unspecified: Secondary | ICD-10-CM | POA: Diagnosis not present

## 2018-06-25 DIAGNOSIS — Z0189 Encounter for other specified special examinations: Secondary | ICD-10-CM

## 2018-06-25 DIAGNOSIS — D62 Acute posthemorrhagic anemia: Secondary | ICD-10-CM | POA: Diagnosis not present

## 2018-06-25 DIAGNOSIS — Z79899 Other long term (current) drug therapy: Secondary | ICD-10-CM

## 2018-06-25 DIAGNOSIS — I482 Chronic atrial fibrillation, unspecified: Secondary | ICD-10-CM

## 2018-06-25 DIAGNOSIS — K567 Ileus, unspecified: Secondary | ICD-10-CM | POA: Diagnosis not present

## 2018-06-25 DIAGNOSIS — K921 Melena: Secondary | ICD-10-CM | POA: Diagnosis present

## 2018-06-25 DIAGNOSIS — I959 Hypotension, unspecified: Secondary | ICD-10-CM | POA: Diagnosis not present

## 2018-06-25 DIAGNOSIS — K644 Residual hemorrhoidal skin tags: Secondary | ICD-10-CM | POA: Diagnosis present

## 2018-06-25 DIAGNOSIS — Z6833 Body mass index (BMI) 33.0-33.9, adult: Secondary | ICD-10-CM

## 2018-06-25 DIAGNOSIS — K51919 Ulcerative colitis, unspecified with unspecified complications: Secondary | ICD-10-CM | POA: Diagnosis present

## 2018-06-25 DIAGNOSIS — M5416 Radiculopathy, lumbar region: Secondary | ICD-10-CM

## 2018-06-25 LAB — URINALYSIS, ROUTINE W REFLEX MICROSCOPIC
BILIRUBIN URINE: NEGATIVE
GLUCOSE, UA: NEGATIVE mg/dL
HGB URINE DIPSTICK: NEGATIVE
Ketones, ur: NEGATIVE mg/dL
Leukocytes, UA: NEGATIVE
Nitrite: NEGATIVE
PH: 7 (ref 5.0–8.0)
Protein, ur: NEGATIVE mg/dL
Specific Gravity, Urine: 1.005 (ref 1.005–1.030)

## 2018-06-25 LAB — CBC WITH DIFFERENTIAL/PLATELET
Abs Immature Granulocytes: 0.03 10*3/uL (ref 0.00–0.07)
Basophils Absolute: 0 10*3/uL (ref 0.0–0.1)
Basophils Relative: 0 %
EOS PCT: 0 %
Eosinophils Absolute: 0 10*3/uL (ref 0.0–0.5)
HCT: 30 % — ABNORMAL LOW (ref 36.0–46.0)
HEMOGLOBIN: 9.7 g/dL — AB (ref 12.0–15.0)
IMMATURE GRANULOCYTES: 1 %
LYMPHS PCT: 11 %
Lymphs Abs: 0.4 10*3/uL — ABNORMAL LOW (ref 0.7–4.0)
MCH: 30.2 pg (ref 26.0–34.0)
MCHC: 32.3 g/dL (ref 30.0–36.0)
MCV: 93.5 fL (ref 80.0–100.0)
Monocytes Absolute: 0.4 10*3/uL (ref 0.1–1.0)
Monocytes Relative: 10 %
NEUTROS ABS: 3 10*3/uL (ref 1.7–7.7)
NEUTROS PCT: 78 %
NRBC: 0 % (ref 0.0–0.2)
Platelets: 297 10*3/uL (ref 150–400)
RBC: 3.21 MIL/uL — ABNORMAL LOW (ref 3.87–5.11)
RDW: 13.7 % (ref 11.5–15.5)
WBC: 3.8 10*3/uL — ABNORMAL LOW (ref 4.0–10.5)

## 2018-06-25 LAB — I-STAT CG4 LACTIC ACID, ED
LACTIC ACID, VENOUS: 4.85 mmol/L — AB (ref 0.5–1.9)
Lactic Acid, Venous: 3.88 mmol/L (ref 0.5–1.9)

## 2018-06-25 LAB — COMPREHENSIVE METABOLIC PANEL
ALT: 19 U/L (ref 0–44)
ANION GAP: 12 (ref 5–15)
AST: 20 U/L (ref 15–41)
Albumin: 2.4 g/dL — ABNORMAL LOW (ref 3.5–5.0)
Alkaline Phosphatase: 70 U/L (ref 38–126)
BUN: 12 mg/dL (ref 8–23)
CHLORIDE: 93 mmol/L — AB (ref 98–111)
CO2: 33 mmol/L — AB (ref 22–32)
CREATININE: 0.79 mg/dL (ref 0.44–1.00)
Calcium: 8.5 mg/dL — ABNORMAL LOW (ref 8.9–10.3)
GFR calc non Af Amer: >60 mL/min (ref >60)
Glucose, Bld: 218 mg/dL — ABNORMAL HIGH (ref 70–99)
POTASSIUM: 3.8 mmol/L (ref 3.5–5.1)
SODIUM: 138 mmol/L (ref 135–145)
Total Bilirubin: 0.7 mg/dL (ref 0.3–1.2)
Total Protein: 5.4 g/dL — ABNORMAL LOW (ref 6.5–8.1)

## 2018-06-25 MED ORDER — MESALAMINE 1.2 G PO TBEC
2.4000 g | DELAYED_RELEASE_TABLET | Freq: Every day | ORAL | Status: DC
  Filled 2018-06-25: qty 2

## 2018-06-25 MED ORDER — VITAMIN D 25 MCG (1000 UNIT) PO TABS
1000.0000 [IU] | ORAL_TABLET | Freq: Every day | ORAL | Status: DC
  Administered 2018-06-26 – 2018-07-02 (×7): 1000 [IU] via ORAL

## 2018-06-25 MED ORDER — ONDANSETRON HCL 4 MG/2ML IJ SOLN
4.0000 mg | Freq: Four times a day (QID) | INTRAMUSCULAR | Status: DC | PRN
  Administered 2018-06-25 – 2018-06-27 (×4): 4 mg via INTRAVENOUS
  Filled 2018-06-25 (×5): qty 2

## 2018-06-25 MED ORDER — PREDNISONE 20 MG PO TABS
20.0000 mg | ORAL_TABLET | Freq: Every day | ORAL | Status: DC
  Administered 2018-06-26: 20 mg via ORAL
  Filled 2018-06-25: qty 1

## 2018-06-25 MED ORDER — ONDANSETRON HCL 4 MG PO TABS
4.0000 mg | ORAL_TABLET | Freq: Four times a day (QID) | ORAL | Status: DC | PRN
  Administered 2018-07-02: 4 mg via ORAL
  Filled 2018-06-25: qty 1

## 2018-06-25 MED ORDER — SODIUM CHLORIDE 0.9 % IV BOLUS (SEPSIS)
1000.0000 mL | Freq: Once | INTRAVENOUS | Status: AC
  Administered 2018-06-25: 1000 mL via INTRAVENOUS

## 2018-06-25 MED ORDER — OXYCODONE-ACETAMINOPHEN 5-325 MG PO TABS
1.0000 | ORAL_TABLET | ORAL | Status: DC | PRN
  Administered 2018-06-26 (×2): 2 via ORAL
  Administered 2018-06-26: 1 via ORAL
  Administered 2018-06-26: 2 via ORAL
  Administered 2018-06-26: 1 via ORAL
  Administered 2018-06-27 (×2): 2 via ORAL
  Administered 2018-06-28: 1 via ORAL
  Administered 2018-06-28 – 2018-06-29 (×3): 2 via ORAL
  Administered 2018-06-29: 1 via ORAL
  Administered 2018-06-29 – 2018-07-02 (×10): 2 via ORAL
  Filled 2018-06-25 (×3): qty 2
  Filled 2018-06-25: qty 1
  Filled 2018-06-25 (×4): qty 2
  Filled 2018-06-25: qty 1
  Filled 2018-06-25 (×5): qty 2
  Filled 2018-06-25: qty 1
  Filled 2018-06-25: qty 2
  Filled 2018-06-25: qty 1
  Filled 2018-06-25: qty 2
  Filled 2018-06-25: qty 1
  Filled 2018-06-25 (×2): qty 2
  Filled 2018-06-25 (×2): qty 1
  Filled 2018-06-25: qty 2

## 2018-06-25 MED ORDER — FENTANYL CITRATE (PF) 100 MCG/2ML IJ SOLN
12.5000 ug | INTRAMUSCULAR | Status: AC | PRN
  Administered 2018-06-25 (×4): 12.5 ug via INTRAVENOUS
  Filled 2018-06-25 (×4): qty 2

## 2018-06-25 MED ORDER — AMLODIPINE BESYLATE 10 MG PO TABS
10.0000 mg | ORAL_TABLET | Freq: Every day | ORAL | Status: DC
  Filled 2018-06-25: qty 1

## 2018-06-25 MED ORDER — POLYETHYLENE GLYCOL 3350 17 G PO PACK
17.0000 g | PACK | Freq: Two times a day (BID) | ORAL | Status: DC
  Administered 2018-06-26 – 2018-06-29 (×6): 17 g via ORAL
  Filled 2018-06-25 (×7): qty 1

## 2018-06-25 MED ORDER — METRONIDAZOLE IN NACL 5-0.79 MG/ML-% IV SOLN
500.0000 mg | Freq: Three times a day (TID) | INTRAVENOUS | Status: DC
  Administered 2018-06-25 – 2018-06-27 (×5): 500 mg via INTRAVENOUS
  Filled 2018-06-25 (×5): qty 100

## 2018-06-25 MED ORDER — SODIUM CHLORIDE 0.9 % IV SOLN: 1000.0000 mL | INTRAVENOUS | Status: DC

## 2018-06-25 MED ORDER — ONDANSETRON HCL 4 MG/2ML IJ SOLN
4.0000 mg | Freq: Once | INTRAMUSCULAR | Status: AC
  Administered 2018-06-25: 4 mg via INTRAVENOUS
  Filled 2018-06-25: qty 2

## 2018-06-25 MED ORDER — SODIUM CHLORIDE 0.9 % IV SOLN
2.0000 g | INTRAVENOUS | Status: DC
  Administered 2018-06-25 – 2018-06-26 (×2): 2 g via INTRAVENOUS
  Filled 2018-06-25 (×2): qty 20
  Filled 2018-06-25: qty 2

## 2018-06-25 MED ORDER — SODIUM CHLORIDE 0.9 % IV SOLN
INTRAVENOUS | Status: DC
  Administered 2018-06-25: 13:00:00 via INTRAVENOUS

## 2018-06-25 MED ORDER — SODIUM CHLORIDE 0.9 % IV BOLUS (SEPSIS): 250.0000 mL | Freq: Once | INTRAVENOUS | Status: DC

## 2018-06-25 MED ORDER — POTASSIUM CHLORIDE IN NACL 20-0.9 MEQ/L-% IV SOLN
INTRAVENOUS | Status: AC
  Administered 2018-06-26: 07:00:00 via INTRAVENOUS
  Filled 2018-06-25: qty 1000

## 2018-06-25 MED ORDER — METOPROLOL SUCCINATE ER 50 MG PO TB24
150.0000 mg | ORAL_TABLET | Freq: Every day | ORAL | Status: DC
  Filled 2018-06-25: qty 3

## 2018-06-25 MED ORDER — POLYETHYLENE GLYCOL 3350 17 G PO PACK
17.0000 g | PACK | Freq: Every day | ORAL | Status: DC
  Administered 2018-06-25: 17 g via ORAL

## 2018-06-25 NOTE — ED Notes (Signed)
ED Provider at bedside. 

## 2018-06-25 NOTE — ED Notes (Signed)
Hospitalist at bedside 

## 2018-06-25 NOTE — ED Notes (Signed)
ED TO INPATIENT HANDOFF REPORT  Name/Age/Gender Felicia Carlson 84 y.o. female  Code Status Code Status History    Date Active Date Inactive Code Status Order ID Comments User Context   06/13/2018 2323 06/20/2018 1619 DNR [id number]  [name], MD Inpatient   10/26/2014 1912 10/30/2014 1814 Full Code [id number]  [name], MD Inpatient    Questions for Most Recent Historical Code Status (Order [id number])    Question Answer Comment   In the event of cardiac or respiratory ARREST Do not call a "code blue"    In the event of cardiac or respiratory ARREST Do not perform Intubation, CPR, defibrillation or ACLS    In the event of cardiac or respiratory ARREST Use medication by any route, position, wound care, and other measures to relive pain and suffering. May use oxygen, suction and manual treatment of airway obstruction as needed for comfort.         Advance Directive Documentation     Most Recent Value  Type of Advance Directive  Healthcare Power of Attorney  Pre-existing out of facility DNR order (yellow form or pink MOST form)  -  "MOST" Form in Place?  -      Home/SNF/Other Home  Chief Complaint gi bleed  Level of Care/Admitting Diagnosis ED Disposition    ED Disposition Condition [place]: [place] [[id number]]  Level of Care: Med-Surg [16]  Diagnosis: Acute colitis [[id number]]  Admitting Physician: [name] [[id number]]  Attending Physician: [name] [[id number]]  Estimated length of stay: past midnight tomorrow  Certification:: I certify this patient will need inpatient services for at least 2 midnights  PT Class (Do Not Modify): Inpatient [101]  PT Acc Code (Do Not Modify): Private [1]       Medical History Past Medical History:  Diagnosis Date  . Arthritis   . Cancer [facility]) 2007   breast cancer right side  . Chest pain   . Chronic kidney disease    stent in kidney right side  . Dyspnea   .  GERD (gastroesophageal reflux disease)    sometimes  . Headache    shingles right side of head and right eye  . Hypertension   . Personal history of chemotherapy   . Personal history of radiation therapy     Allergies Allergies  Allergen Reactions  . Isovue [Iopamidol] Hives and Rash    Pt  Had hives, rash and erythema her chest, face and neck.  Slight eye swelling.  Pt given 50 mg po benadryl.  Pt also developed severe abd pain during contrast injection and continued to get worse.  For those reasons the radiologist had the pt transported by EMS to [facility].  Pt needs full premeds in the future.  J Bohm  RTRCT  . Tetanus Immune Globulin Swelling    IV Location/Drains/Wounds Patient Lines/Drains/Airways Status   Active Line/Drains/Airways    Name:   Placement date:   Placement time:   Site:   Days:   Peripheral IV 06/25/18 Left Antecubital   06/25/18    -    Antecubital   less than 1   Incision (Closed) 10/26/14 Knee Right   10/26/14    [id number]     2952          Labs/Imaging Results for orders placed or performed during the hospital encounter of 06/25/18 (from the past 48 hour(s))  Comprehensive metabolic panel     Status: Abnormal  Collection Time: 06/25/18 12:31 PM  Result Value Ref Range   Sodium 138 135 - 145 mmol/L   Potassium 3.8 3.5 - 5.1 mmol/L   Chloride 93 (L) 98 - 111 mmol/L   CO2 33 (H) 22 - 32 mmol/L   Glucose, Bld 218 (H) 70 - 99 mg/dL   BUN 12 8 - 23 mg/dL   Creatinine, Ser 0.79 0.44 - 1.00 mg/dL   Calcium 8.5 (L) 8.9 - 10.3 mg/dL   Total Protein 5.4 (L) 6.5 - 8.1 g/dL   Albumin 2.4 (L) 3.5 - 5.0 g/dL   AST 20 15 - 41 U/L   ALT 19 0 - 44 U/L   Alkaline Phosphatase 70 38 - 126 U/L   Total Bilirubin 0.7 0.3 - 1.2 mg/dL   GFR calc non Af Amer >60 >60 mL/min   GFR calc Af Amer >60 >60 mL/min    Comment: (NOTE) The eGFR has been calculated using the CKD EPI equation. This calculation has not been validated in all clinical situations. eGFR's persistently <60 mL/min  signify possible Chronic Kidney Disease.    Anion gap 12 5 - 15    Comment: Performed at [facility], [place] [address], [place], [place] [address]  CBC with Differential     Status: Abnormal   Collection Time: 06/25/18 12:31 PM  Result Value Ref Range   WBC 3.8 (L) 4.0 - 10.5 K/uL   RBC 3.21 (L) 3.87 - 5.11 MIL/uL   Hemoglobin 9.7 (L) 12.0 - 15.0 g/dL   HCT 30.0 (L) 36.0 - 46.0 %   MCV 93.5 80.0 - 100.0 fL   MCH 30.2 26.0 - 34.0 pg   MCHC 32.3 30.0 - 36.0 g/dL   RDW 13.7 11.5 - 15.5 %   Platelets 297 150 - 400 K/uL   nRBC 0.0 0.0 - 0.2 %   Neutrophils Relative % 78 %   Neutro Abs 3.0 1.7 - 7.7 K/uL   Lymphocytes Relative 11 %   Lymphs Abs 0.4 (L) 0.7 - 4.0 K/uL   Monocytes Relative 10 %   Monocytes Absolute 0.4 0.1 - 1.0 K/uL   Eosinophils Relative 0 %   Eosinophils Absolute 0.0 0.0 - 0.5 K/uL   Basophils Relative 0 %   Basophils Absolute 0.0 0.0 - 0.1 K/uL   Immature Granulocytes 1 %   Abs Immature Granulocytes 0.03 0.00 - 0.07 K/uL   Target Cells PRESENT     Comment: Performed at [facility], [place] [address], [place], [place] [address]  I-Stat CG4 Lactic Acid, ED     Status: Abnormal   Collection Time: 06/25/18  1:02 PM  Result Value Ref Range   Lactic Acid, Venous 4.85 (HH) 0.5 - 1.9 mmol/L   Comment NOTIFIED PHYSICIAN   I-Stat CG4 Lactic Acid, ED  (not at  [facility])     Status: Abnormal   Collection Time: 06/25/18  5:08 PM  Result Value Ref Range   Lactic Acid, Venous 3.88 (HH) 0.5 - 1.9 mmol/L   Comment NOTIFIED PHYSICIAN    Ct Abdomen Pelvis Wo Contrast  Result Date: 06/25/2018 CLINICAL DATA:  Intermittent rectal bleeding for 2 weeks. EXAM: CT ABDOMEN AND PELVIS WITHOUT CONTRAST TECHNIQUE: Multidetector CT imaging of the abdomen and pelvis was performed following the standard protocol without IV contrast. COMPARISON:  CT abdomen and pelvis 06/13/2018. FINDINGS: Lower chest: Mild dependent atelectasis is seen in the lung bases. No  pleural or pericardial effusion. Calcific aortic and coronary atherosclerosis noted.  Hepatobiliary: A few tiny stones are seen layering dependently in the fundus of the gallbladder. No evidence of cholecystitis. Gallbladder and biliary tree appear normal. Pancreas: No pancreatic ductal dilatation or surrounding inflammatory changes. The pancreas is atrophic as seen on the prior exam. Spleen: Normal in size. Calcifications in the spleen consistent with old granulomatous disease noted. Adrenals/Urinary Tract: The adrenal glands are unremarkable. Mild atrophy of the right kidney is seen. A 0.3 cm in diameter nonobstructing stone in the upper pole of the right kidney is noted. No hydronephrosis or ureteral stones. Urinary bladder appears normal. Stomach/Bowel: The rectosigmoid colon is decompressed but its walls appear thickened. A few scattered diverticula are identified but no focal inflammatory change about a diverticulum is seen. The descending and sigmoid colon are tortuous. There is a large volume of stool in the ascending and transverse colon. No pneumatosis or portal venous gas. The appendix has been removed. The stomach and small bowel appear normal. Vascular/Lymphatic: Aortic atherosclerosis. No enlarged abdominal or pelvic lymph nodes. Reproductive: Status post hysterectomy. No adnexal masses. Other: No free or focal fluid collection. No free intraperitoneal air. Musculoskeletal: No acute or focal abnormality. Scoliosis and multilevel lumbar degenerative change noted. IMPRESSION: Although the rectosigmoid colon is decompressed, its walls appear thickened most consistent with infectious or inflammatory colitis. No CT signs of ischemia are present. Diverticulosis without diverticulitis. Large stool burden ascending and transverse colon. A few tiny gallstones are seen but there is no evidence of cholecystitis. 0.3 cm nonobstructing stone right kidney. Calcific aortic and coronary atherosclerosis. Electronically  Signed   By: Inge Rise M.D.   On: 06/25/2018 15:31   Dg Chest Port 1 View  Result Date: 06/25/2018 CLINICAL DATA:  Chest pain, shortness of breath. EXAM: PORTABLE CHEST 1 VIEW COMPARISON:  Radiographs of July 25, 2016. FINDINGS: Stable cardiomegaly. No pneumothorax or pleural effusion is noted. Elevated right hemidiaphragm is noted. No consolidative process is noted. Degenerative changes are seen involving both glenohumeral joints. IMPRESSION: No acute cardiopulmonary abnormality seen. Electronically Signed   By: Marijo Conception, M.D.   On: 06/25/2018 16:10   None  Pending Labs Unresulted Labs (From admission, onward)    Start     Ordered   06/25/18 1545  Blood Culture (routine x 2)  BLOOD CULTURE X 2,   STAT     06/25/18 1546   06/25/18 1545  Urinalysis, Routine w reflex microscopic (not at [facility])  STAT,   STAT     06/25/18 1546   06/25/18 1545  Urine culture  STAT,   STAT     06/25/18 1546   Signed and Held  CBC  Tomorrow morning,   R     Signed and Held   Signed and Held  Basic metabolic panel  Tomorrow morning,   R     Signed and Held          Vitals/Pain Today's Vitals   06/25/18 1538 06/25/18 1600 06/25/18 1631 06/25/18 1706  BP:  114/64  125/62  Pulse:  67  (!) 48  Resp:  (!) 23  16  Temp:      TempSrc:      SpO2:  93%  98%  PainSc: 8   7      Isolation Precautions No active isolations  Medications Medications  0.9 %  sodium chloride infusion ( Intravenous Restarted 06/25/18 1708)  fentaNYL (SUBLIMAZE) injection 12.5 mcg (12.5 mcg Intravenous Given 06/25/18 1538)  0.9 %  sodium chloride infusion (has no administration  in time range)  sodium chloride 0.9 % bolus 1,000 mL (1,000 mLs Intravenous New Bag/Given (Non-Interop) 06/25/18 1626)    And  sodium chloride 0.9 % bolus 1,000 mL (1,000 mLs Intravenous New Bag/Given 06/25/18 1630)    And  sodium chloride 0.9 % bolus 250 mL (has no administration in time range)  cefTRIAXone (ROCEPHIN) 2 g in sodium  chloride 0.9 % 100 mL IVPB (2 g Intravenous New Bag/Given 06/25/18 1709)  metroNIDAZOLE (FLAGYL) IVPB 500 mg (has no administration in time range)  polyethylene glycol (MIRALAX / GLYCOLAX) packet 17 g (has no administration in time range)  ondansetron (ZOFRAN) injection 4 mg (4 mg Intravenous Given 06/25/18 1254)    Mobility walks

## 2018-06-25 NOTE — ED Notes (Signed)
EDP [facility] notified of POC Lactic 4.85

## 2018-06-25 NOTE — ED Triage Notes (Signed)
Patient from home c/o intermittent rectal bleeding x2 weeks. Seen for same x2 weeks ago and dx with colitis. Reports increased weakness and generalized body aches.

## 2018-06-25 NOTE — H&P (Addendum)
History and Physical    Felicia Carlson GNF:[id number] DOB: May 04, 1925 DOA: 06/25/2018  PCP: Lajean Manes, MD  Patient coming from: home  Chief Complaint: Hematochezia with crampy abdominal pain  HPI: Felicia Carlson is a 84 y.o. female with medical history significant of ulcerative colitis recently discharged from the hospital on 06/20/2018 on steroids for an ulcerative colitis flare with infectious work-up negative, chronic atrial fibrillation on Xarelto, her Xarelto was held 4 days prior to admission by her PCP, since discharge she been having crampy abdominal pain with mucousy hematochezia, she has not had a bowel movement for the last 8 to 9 days, she relates her abdominal leg continues to get worse she, her daughter relates she is anorexic is lost about 10 pounds in the last 2 weeks, she has an appointment with [facility] tomorrow but her daughter brought her in due to her severe abdominal pain not able to tolerate anything by mouth, she denies any fever, chills, shortness of breath, chest pain, focal weaknesses.  ED Course:  In the ED she was found to be normotensive with vitals stable, mild leukopenia with a hemoglobin of 9.7 on discharge on 08/30/2017 it was 10.8, lactic acid 4.8, CT scan of the abdomen and pelvis done on admission showed rectosigmoid colon wall thickening, no signs of ischemic colitis, large stool burden in the ascending and transverse colon   Review of Systems: As per HPI otherwise 10 point review of systems negative.    Past Medical History:  Diagnosis Date  . Arthritis   . Cancer [facility]) 2007   breast cancer right side  . Chest pain   . Chronic kidney disease    stent in kidney right side  . Dyspnea   . GERD (gastroesophageal reflux disease)    sometimes  . Headache    shingles right side of head and right eye  . Hypertension   . Personal history of chemotherapy   . Personal history of radiation therapy     Past Surgical History:  Procedure Laterality Date  .  ABDOMINAL HYSTERECTOMY    . APPENDECTOMY    . BREAST LUMPECTOMY Right 2007  . BREAST SURGERY Right 2011   lumpectomy  . EYE SURGERY Bilateral 2015   cataracts  . TONSILLECTOMY     as a child  . TOTAL KNEE ARTHROPLASTY Right 10/26/2014   Procedure: RIGHT TOTAL KNEE ARTHROPLASTY;  Surgeon: [name], MD;  Location: [place];  Service: Orthopedics;  Laterality: Right;     reports that she has never smoked. She has never used smokeless tobacco. She reports that she drinks alcohol. She reports that she does not use drugs.  Allergies  Allergen Reactions  . Isovue [Iopamidol] Hives and Rash    Pt  Had hives, rash and erythema her chest, face and neck.  Slight eye swelling.  Pt given 50 mg po benadryl.  Pt also developed severe abd pain during contrast injection and continued to get worse.  For those reasons the radiologist had the pt transported by EMS to [facility].  Pt needs full premeds in the future.  J Bohm  RTRCT  . Tetanus Immune Globulin Swelling    Family History  Problem Relation Age of Onset  . Breast cancer Sister 20    Prior to Admission medications   Medication Sig Start Date End Date Taking? Authorizing Provider  amLODipine (NORVASC) 10 MG tablet Take 10 mg by mouth daily.   Yes [provider]  Cholecalciferol (VITAMIN D-1000 MAX  ST) 1000 units tablet Take 1,000 Units by mouth daily.    Yes [provider]  furosemide (LASIX) 40 MG tablet Take 40 mg by mouth daily.   Yes [provider]  meloxicam (MOBIC) 15 MG tablet Take 15 mg by mouth daily.  05/21/18  Yes [provider]  mesalamine (LIALDA) 1.2 g EC tablet Take 2.4 g by mouth daily with breakfast.  06/06/18  Yes [provider]  metoprolol succinate (TOPROL-XL) 50 MG 24 hr tablet Take 3 tablets (150 mg total) by mouth daily. 06/21/18 07/21/18 Yes Purohit, Konrad Dolores, MD  predniSONE (DELTASONE) 10 MG tablet Take 4 tablets (40 mg total) by mouth daily with breakfast for 5 days,  THEN 2 tablets (20 mg total) daily with breakfast for 5 days, THEN 1 tablet (10 mg total) daily with breakfast for 5 days, THEN 0.5 tablets (5 mg total) daily with breakfast for 14 days. 06/21/18 07/20/18 Yes Purohit, Konrad Dolores, MD  oxyCODONE-acetaminophen (ROXICET) 5-325 MG per tablet Take 1-2 tablets by mouth every 4 (four) hours as needed. Patient not taking: Reported on 06/25/2018 10/28/14   [name], MD  Rivaroxaban (XARELTO) 15 MG TABS tablet Take 1 tablet (15 mg total) by mouth daily with supper. Patient not taking: Reported on 06/25/2018 06/20/18   Cristy Folks, MD    Physical Exam: Vitals:   06/25/18 1406 06/25/18 1430 06/25/18 1536 06/25/18 1600  BP: (!) 145/67 (!) 144/63 (!) 129/55 114/64  Pulse: 65 70 (!) 57 67  Resp: 15 (!) 24 (!) 27 (!) 23  Temp:      TempSrc:      SpO2: 96% 93% 98% 93%    Constitutional: NAD, calm, comfortable Vitals:   06/25/18 1406 06/25/18 1430 06/25/18 1536 06/25/18 1600  BP: (!) 145/67 (!) 144/63 (!) 129/55 114/64  Pulse: 65 70 (!) 57 67  Resp: 15 (!) 24 (!) 27 (!) 23  Temp:      TempSrc:      SpO2: 96% 93% 98% 93%   Eyes: PERRL, lids and conjunctivae normal ENMT: Mucous membranes are moist. Posterior pharynx clear of any exudate or lesions.Normal dentition.  Neck: normal, supple, no masses, no thyromegaly Respiratory: clear to auscultation bilaterally, no wheezing, no crackles. Normal respiratory effort. No accessory muscle use.  Cardiovascular: Regular rate and rhythm, no murmurs / rubs / gallops. No extremity edema. 2+ pedal pulses. No carotid bruits.  Abdomen: Abdomen is soft no palpable masses no hepatomegaly tenderness in the right lower quadrant and epigastric area no suprapubic tenderness minor "left lower quadrant tenderness Musculoskeletal: no clubbing / cyanosis. No joint deformity upper and lower extremities. Good ROM, no contractures. Normal muscle tone.  Skin: no rashes, lesions, ulcers. No induration Neurologic: CN  2-12 grossly intact. Sensation intact, DTR normal. Strength 5/5 in all 4.  Psychiatric: Normal judgment and insight. Alert and oriented x 3. Normal mood.    Labs on Admission: I have personally reviewed following labs and imaging studies  CBC: Recent Labs  Lab 06/20/18 0437 06/25/18 1231  WBC 10.0 3.8*  NEUTROABS  --  3.0  HGB 10.8* 9.7*  HCT 33.0* 30.0*  MCV 93.0 93.5  PLT 298 500   Basic Metabolic Panel: Recent Labs  Lab 06/20/18 0437 06/25/18 1231  NA 132* 138  K 3.9 3.8  CL 102 93*  CO2 24 33*  GLUCOSE 168* 218*  BUN 31* 12  CREATININE 0.75 0.79  CALCIUM 8.7* 8.5*   GFR: Estimated Creatinine Clearance: 41.8 mL/min (  by C-G formula based on SCr of 0.79 mg/dL). Liver Function Tests: Recent Labs  Lab 06/25/18 1231  AST 20  ALT 19  ALKPHOS 70  BILITOT 0.7  PROT 5.4*  ALBUMIN 2.4*   No results for input(s): LIPASE, AMYLASE in the last 168 hours. No results for input(s): AMMONIA in the last 168 hours. Coagulation Profile: No results for input(s): INR, PROTIME in the last 168 hours. Cardiac Enzymes: No results for input(s): CKTOTAL, CKMB, CKMBINDEX, TROPONINI in the last 168 hours. BNP (last 3 results) No results for input(s): PROBNP in the last 8760 hours. HbA1C: No results for input(s): HGBA1C in the last 72 hours. CBG: Recent Labs  Lab 06/19/18 1202 06/19/18 1657 06/19/18 2057 06/20/18 0753 06/20/18 1139  GLUCAP 310* 192* 196* 123* 186*   Lipid Profile: No results for input(s): CHOL, HDL, LDLCALC, TRIG, CHOLHDL, LDLDIRECT in the last 72 hours. Thyroid Function Tests: No results for input(s): TSH, T4TOTAL, FREET4, T3FREE, THYROIDAB in the last 72 hours. Anemia Panel: No results for input(s): VITAMINB12, FOLATE, FERRITIN, TIBC, IRON, RETICCTPCT in the last 72 hours. Urine analysis:    Component Value Date/Time   COLORURINE YELLOW 11/02/2010 0300   APPEARANCEUR CLEAR 11/02/2010 0300   LABSPEC 1.012 11/02/2010 0300   PHURINE 5.0 11/02/2010 0300     GLUCOSEU NEGATIVE 11/02/2010 0300   HGBUR NEGATIVE 11/02/2010 0300   BILIRUBINUR NEGATIVE 11/02/2010 0300   KETONESUR NEGATIVE 11/02/2010 0300   PROTEINUR NEGATIVE 11/02/2010 0300   UROBILINOGEN 0.2 11/02/2010 0300   NITRITE NEGATIVE 11/02/2010 0300   LEUKOCYTESUR  11/02/2010 0300    NEGATIVE MICROSCOPIC NOT DONE ON URINES WITH NEGATIVE PROTEIN, BLOOD, LEUKOCYTES, NITRITE, OR GLUCOSE <1000 mg/dL.    Radiological Exams on Admission: Ct Abdomen Pelvis Wo Contrast  Result Date: 06/25/2018 CLINICAL DATA:  Intermittent rectal bleeding for 2 weeks. EXAM: CT ABDOMEN AND PELVIS WITHOUT CONTRAST TECHNIQUE: Multidetector CT imaging of the abdomen and pelvis was performed following the standard protocol without IV contrast. COMPARISON:  CT abdomen and pelvis 06/13/2018. FINDINGS: Lower chest: Mild dependent atelectasis is seen in the lung bases. No pleural or pericardial effusion. Calcific aortic and coronary atherosclerosis noted. Hepatobiliary: A few tiny stones are seen layering dependently in the fundus of the gallbladder. No evidence of cholecystitis. Gallbladder and biliary tree appear normal. Pancreas: No pancreatic ductal dilatation or surrounding inflammatory changes. The pancreas is atrophic as seen on the prior exam. Spleen: Normal in size. Calcifications in the spleen consistent with old granulomatous disease noted. Adrenals/Urinary Tract: The adrenal glands are unremarkable. Mild atrophy of the right kidney is seen. A 0.3 cm in diameter nonobstructing stone in the upper pole of the right kidney is noted. No hydronephrosis or ureteral stones. Urinary bladder appears normal. Stomach/Bowel: The rectosigmoid colon is decompressed but its walls appear thickened. A few scattered diverticula are identified but no focal inflammatory change about a diverticulum is seen. The descending and sigmoid colon are tortuous. There is a large volume of stool in the ascending and transverse colon. No pneumatosis or  portal venous gas. The appendix has been removed. The stomach and small bowel appear normal. Vascular/Lymphatic: Aortic atherosclerosis. No enlarged abdominal or pelvic lymph nodes. Reproductive: Status post hysterectomy. No adnexal masses. Other: No free or focal fluid collection. No free intraperitoneal air. Musculoskeletal: No acute or focal abnormality. Scoliosis and multilevel lumbar degenerative change noted. IMPRESSION: Although the rectosigmoid colon is decompressed, its walls appear thickened most consistent with infectious or inflammatory colitis. No CT signs of ischemia are present. Diverticulosis  without diverticulitis. Large stool burden ascending and transverse colon. A few tiny gallstones are seen but there is no evidence of cholecystitis. 0.3 cm nonobstructing stone right kidney. Calcific aortic and coronary atherosclerosis. Electronically Signed   By: Inge Rise M.D.   On: 06/25/2018 15:31   Dg Chest Port 1 View  Result Date: 06/25/2018 CLINICAL DATA:  Chest pain, shortness of breath. EXAM: PORTABLE CHEST 1 VIEW COMPARISON:  Radiographs of July 25, 2016. FINDINGS: Stable cardiomegaly. No pneumothorax or pleural effusion is noted. Elevated right hemidiaphragm is noted. No consolidative process is noted. Degenerative changes are seen involving both glenohumeral joints. IMPRESSION: No acute cardiopulmonary abnormality seen. Electronically Signed   By: Marijo Conception, M.D.   On: 06/25/2018 16:10    EKG: Independently reviewed. None  Assessment/Plan Abdominal cramping/ Lower GI bleed/Colitis, acute: She has no leukocytosis in fact she is mildly leukopenic no left shift, her vitals are stable she is not tachycardic, she denies any fever not clear if this is infectious in nature so we will hold on on antibiotics for now.  Cultures and urine cultures have been sent, chest x-ray shows no acute finding We will increase her oral dose of prednisone. We have consulted GI for further  evaluation. Admit to MedSurg CT scan of the abdomen pelvis showed large amount of stool burden we will start her on MiraLAX p.o. twice daily. Use SCD for DVT prophylaxis.  Continue narcotics for pain    Dehydration: With a level elevated bicarb and a low chloride we will start on gentle IV fluid hydration with potassium supplementation.  Ulcerative colitis with complication (HCC) Continue steroids at a slightly higher dose    Metabolic alkalosis: Likely due to contraction alkalosis start IV fluids recheck a basic metabolic panel in the morning. Which is probably contributing to an elevated lactate.  Lactic acidosis: This is not sepsis, elevation in lactic acid is probably due to contraction alkalosis and/or acute colitis.   DVT prophylaxis: SCD Code Status: Full Family Communication: Daughter Disposition Plan:  Consults called: [facility] Admission status: inpatient  It is my clinical opinion that admission to INPATIENT is reasonable and necessary in this 84 y.o. female past medical history of ulcerative colitis on steroids comes in with crampy abdominal pain and mucousy bloody bowel movements I am concerned about a possible colitis question ulcerative colitis flare among other diagnoses would be enterocolitis infectious less likely ischemic, her physical exam was positive for right lower quadrant tenderness and epigastric tenderness, with CT scan findings as above. She will be started empirically on IV antibiotics IV fluids will increase her steroids GI has been consulted and await further recommendations  Given the aforementioned, the predictability of an adverse outcome is felt to be significant. I expect that the patient will require at least 2 midnights in the hospital to treat this condition.  [name] MD [facility]  If 7PM-7AM, please contact night-coverage [email or web address] Password [email or web address]  06/25/2018, 5:10 PM

## 2018-06-25 NOTE — ED Notes (Signed)
Matt RN attempting IV at bedside.

## 2018-06-25 NOTE — ED Provider Notes (Signed)
[place] DEPT Provider Note   CSN: [id number] Arrival date & time: 06/25/18  1128     History   Chief Complaint Chief Complaint  Patient presents with  . Weakness    HPI Felicia Carlson is a 84 y.o. female.  Patient's daughter is historian.  HPI   Patient is here for evaluation of ongoing symptoms including crampy abdominal pain, diarrhea, rectal bleeding, malaise, decreased oral intake, and concern for lack of diagnosis.  She was recently hospitalized for same.  Since then she is seeing her PCP and he did additional testing.  He told her to stop taking her Xarelto.  She was also referred to a new GI doctor, the appointment is scheduled for tomorrow.  Her daughter brought her here today to see about getting something for pain, to get her through, until she can be seen tomorrow by the GI doctor.  There is been no fever, chills, shortness of breath, chest pain, focal weakness or paresthesia.  There are no other known modifying factors.  Past Medical History:  Diagnosis Date  . Arthritis   . Cancer [facility]) 2007   breast cancer right side  . Chest pain   . Chronic kidney disease    stent in kidney right side  . Dyspnea   . GERD (gastroesophageal reflux disease)    sometimes  . Headache    shingles right side of head and right eye  . Hypertension   . Personal history of chemotherapy   . Personal history of radiation therapy     Patient Active Problem List   Diagnosis Date Noted  . Abdominal cramping 06/25/2018  . GIB (gastrointestinal bleeding) 06/16/2018  . Cardiac murmur 06/14/2018  . Lower GI bleed 06/13/2018  . Ulcerative colitis with complication ([place]) 46/96/2952  . Chest pain 06/13/2018  . Debility 06/13/2018  . Hypokalemia 06/13/2018  . Osteoarthritis of right knee 10/26/2014  . Status post total right knee replacement 10/26/2014    Past Surgical History:  Procedure Laterality Date  . ABDOMINAL HYSTERECTOMY    . APPENDECTOMY      . BREAST LUMPECTOMY Right 2007  . BREAST SURGERY Right 2011   lumpectomy  . EYE SURGERY Bilateral 2015   cataracts  . TONSILLECTOMY     as a child  . TOTAL KNEE ARTHROPLASTY Right 10/26/2014   Procedure: RIGHT TOTAL KNEE ARTHROPLASTY;  Surgeon: [name], MD;  Location: [place];  Service: Orthopedics;  Laterality: Right;     OB History   None      Home Medications    Prior to Admission medications   Medication Sig Start Date End Date Taking? Authorizing Provider  amLODipine (NORVASC) 10 MG tablet Take 10 mg by mouth daily.   Yes [provider]  Cholecalciferol (VITAMIN D-1000 MAX ST) 1000 units tablet Take 1,000 Units by mouth daily.    Yes [provider]  furosemide (LASIX) 40 MG tablet Take 40 mg by mouth daily.   Yes [provider]  meloxicam (MOBIC) 15 MG tablet Take 15 mg by mouth daily.  05/21/18  Yes [provider]  mesalamine (LIALDA) 1.2 g EC tablet Take 2.4 g by mouth daily with breakfast.  06/06/18  Yes [provider]  metoprolol succinate (TOPROL-XL) 50 MG 24 hr tablet Take 3 tablets (150 mg total) by mouth daily. 06/21/18 07/21/18 Yes Purohit, Konrad Dolores, MD  predniSONE (DELTASONE) 10 MG tablet Take 4 tablets (40 mg total) by mouth daily with breakfast for  5 days, THEN 2 tablets (20 mg total) daily with breakfast for 5 days, THEN 1 tablet (10 mg total) daily with breakfast for 5 days, THEN 0.5 tablets (5 mg total) daily with breakfast for 14 days. 06/21/18 07/20/18 Yes Purohit, Konrad Dolores, MD  oxyCODONE-acetaminophen (ROXICET) 5-325 MG per tablet Take 1-2 tablets by mouth every 4 (four) hours as needed. Patient not taking: Reported on 06/25/2018 10/28/14   [name], MD  Rivaroxaban (XARELTO) 15 MG TABS tablet Take 1 tablet (15 mg total) by mouth daily with supper. Patient not taking: Reported on 06/25/2018 06/20/18   Cristy Folks, MD    Family History Family History  Problem Relation Age of Onset  .  Breast cancer Sister 72    Social History Social History   Tobacco Use  . Smoking status: Never Smoker  . Smokeless tobacco: Never Used  Substance Use Topics  . Alcohol use: Yes    Comment: wine occasional  . Drug use: No     Allergies   Isovue [iopamidol] and Tetanus immune globulin   Review of Systems Review of Systems  All other systems reviewed and are negative.    Physical Exam Updated Vital Signs BP 114/64   Pulse 67   Temp 98.5 F (36.9 C) (Rectal)   Resp (!) 23   SpO2 93%   Physical Exam  Constitutional: She appears well-developed. She appears distressed (Uncomfortable).  Elderly, frail  HENT:  Head: Normocephalic and atraumatic.  Eyes: Pupils are equal, round, and reactive to light. Conjunctivae and EOM are normal.  Neck: Normal range of motion and phonation normal. Neck supple.  Cardiovascular: Normal rate and regular rhythm.  Pulmonary/Chest: Effort normal and breath sounds normal. She exhibits no tenderness.  Abdominal: Soft. She exhibits no distension. There is tenderness (Diffuse, mild). There is no guarding.  Musculoskeletal: Normal range of motion.  Neurological: She is alert. She exhibits normal muscle tone.  Skin: Skin is warm and dry.  Psychiatric: She has a normal mood and affect. Her behavior is normal.  Nursing note and vitals reviewed.    ED Treatments / Results  Labs (all labs ordered are listed, but only abnormal results are displayed) Labs Reviewed  COMPREHENSIVE METABOLIC PANEL - Abnormal; Notable for the following components:      Result Value   Chloride 93 (*)    CO2 33 (*)    Glucose, Bld 218 (*)    Calcium 8.5 (*)    Total Protein 5.4 (*)    Albumin 2.4 (*)    All other components within normal limits  CBC WITH DIFFERENTIAL/PLATELET - Abnormal; Notable for the following components:   WBC 3.8 (*)    RBC 3.21 (*)    Hemoglobin 9.7 (*)    HCT 30.0 (*)    Lymphs Abs 0.4 (*)    All other components within normal limits    I-STAT CG4 LACTIC ACID, ED - Abnormal; Notable for the following components:   Lactic Acid, Venous 4.85 (*)    All other components within normal limits  CULTURE, BLOOD (ROUTINE X 2)  CULTURE, BLOOD (ROUTINE X 2)  URINE CULTURE  URINALYSIS, ROUTINE W REFLEX MICROSCOPIC  I-STAT CG4 LACTIC ACID, ED    EKG None  Radiology Ct Abdomen Pelvis Wo Contrast  Result Date: 06/25/2018 CLINICAL DATA:  Intermittent rectal bleeding for 2 weeks. EXAM: CT ABDOMEN AND PELVIS WITHOUT CONTRAST TECHNIQUE: Multidetector CT imaging of the abdomen and pelvis was performed following the standard protocol without IV contrast.  COMPARISON:  CT abdomen and pelvis 06/13/2018. FINDINGS: Lower chest: Mild dependent atelectasis is seen in the lung bases. No pleural or pericardial effusion. Calcific aortic and coronary atherosclerosis noted. Hepatobiliary: A few tiny stones are seen layering dependently in the fundus of the gallbladder. No evidence of cholecystitis. Gallbladder and biliary tree appear normal. Pancreas: No pancreatic ductal dilatation or surrounding inflammatory changes. The pancreas is atrophic as seen on the prior exam. Spleen: Normal in size. Calcifications in the spleen consistent with old granulomatous disease noted. Adrenals/Urinary Tract: The adrenal glands are unremarkable. Mild atrophy of the right kidney is seen. A 0.3 cm in diameter nonobstructing stone in the upper pole of the right kidney is noted. No hydronephrosis or ureteral stones. Urinary bladder appears normal. Stomach/Bowel: The rectosigmoid colon is decompressed but its walls appear thickened. A few scattered diverticula are identified but no focal inflammatory change about a diverticulum is seen. The descending and sigmoid colon are tortuous. There is a large volume of stool in the ascending and transverse colon. No pneumatosis or portal venous gas. The appendix has been removed. The stomach and small bowel appear normal. Vascular/Lymphatic:  Aortic atherosclerosis. No enlarged abdominal or pelvic lymph nodes. Reproductive: Status post hysterectomy. No adnexal masses. Other: No free or focal fluid collection. No free intraperitoneal air. Musculoskeletal: No acute or focal abnormality. Scoliosis and multilevel lumbar degenerative change noted. IMPRESSION: Although the rectosigmoid colon is decompressed, its walls appear thickened most consistent with infectious or inflammatory colitis. No CT signs of ischemia are present. Diverticulosis without diverticulitis. Large stool burden ascending and transverse colon. A few tiny gallstones are seen but there is no evidence of cholecystitis. 0.3 cm nonobstructing stone right kidney. Calcific aortic and coronary atherosclerosis. Electronically Signed   By: Inge Rise M.D.   On: 06/25/2018 15:31    Procedures .Critical Care Performed by: Daleen Bo, MD Authorized by: Daleen Bo, MD   Critical care provider statement:    Critical care time (minutes):  40   Critical care start time:  06/25/2018 12:10 PM   Critical care end time:  06/25/2018 4:06 PM   Critical care time was exclusive of:  Separately billable procedures and treating other patients   Critical care was necessary to treat or prevent imminent or life-threatening deterioration of the following conditions:  Sepsis   Critical care was time spent personally by me on the following activities:  Blood draw for specimens, development of treatment plan with patient or surrogate, discussions with consultants, evaluation of patient's response to treatment, examination of patient, obtaining history from patient or surrogate, ordering and performing treatments and interventions, ordering and review of laboratory studies, pulse oximetry, re-evaluation of patient's condition, review of old charts and ordering and review of radiographic studies   (including critical care time)  Medications Ordered in ED Medications  0.9 %  sodium chloride  infusion ( Intravenous New Bag/Given 06/25/18 1234)  fentaNYL (SUBLIMAZE) injection 12.5 mcg (12.5 mcg Intravenous Given 06/25/18 1538)  0.9 %  sodium chloride infusion (has no administration in time range)  sodium chloride 0.9 % bolus 1,000 mL (1,000 mLs Intravenous New Bag/Given (Non-Interop) 06/25/18 1626)    And  sodium chloride 0.9 % bolus 1,000 mL (1,000 mLs Intravenous New Bag/Given 06/25/18 1630)    And  sodium chloride 0.9 % bolus 250 mL (has no administration in time range)  cefTRIAXone (ROCEPHIN) 2 g in sodium chloride 0.9 % 100 mL IVPB (has no administration in time range)  metroNIDAZOLE (FLAGYL) IVPB 500 mg (has  no administration in time range)  polyethylene glycol (MIRALAX / GLYCOLAX) packet 17 g (has no administration in time range)  ondansetron (ZOFRAN) injection 4 mg (4 mg Intravenous Given 06/25/18 1254)     Initial Impression / Assessment and Plan / ED Course  I have reviewed the triage vital signs and the nursing notes.  Pertinent labs & imaging results that were available during my care of the patient were reviewed by me and considered in my medical decision making (see chart for details).  Clinical Course as of Jun 25 1650  Wed Jun 25, 2018  1333 Elevated, ordered to assess for possibility of ischemic bowel.  I-Stat CG4 Lactic Acid, ED(!!) [EW]  1334 Normal except white count low, hemoglobin low  CBC with Differential(!) [EW]  1334 Hemoglobin baseline over the last week.   [EW]  1334 Normal except chloride low, CO2 high, glucose high, calcium low, total protein low, albumin low  Comprehensive metabolic panel(!) [EW]  0998 Normal  Temp: 98.5 F (36.9 C) [EW]  1538 Abnormal, consistent now with colitis of nonspecific nature.  Per radiologist no CT evidence for ischemia.  Images reviewed by me.  CT ABDOMEN PELVIS WO CONTRAST [EW]  3382 At this point without discrete CT imaging evidence for bowel ischemia, and elevated lactate, will initiate treatment for possible  infectious colitis.  Because of the possibility for ultimate diagnosis of sepsis, will obtain blood cultures prior to starting antibiotics.   [EW]  1607 Elevated right hemidiaphragm, no infiltrate or CHF, images reviewed by me   [EW]  1625 I discussed the case with the on-call gastroenterologist, from [place].  She will see the patient as a Optometrist.  She recommends starting MiraLAX for constipation which may improve the rectal bleeding.   [EW]    Clinical Course User Index [EW] Daleen Bo, MD    13:35: The patient is noted to have a lactate>4. With the current information available to me, I don't think the patient is in septic shock. The lactate>4, is related to OTHER SHOCK suspected bowel ischemia.  Patient Vitals for the past 24 hrs:  BP Temp Temp src Pulse Resp SpO2  06/25/18 1600 114/64 - - 67 (!) 23 93 %  06/25/18 1536 (!) 129/55 - - (!) 57 (!) 27 98 %  06/25/18 1430 (!) 144/63 - - 70 (!) 24 93 %  06/25/18 1406 (!) 145/67 - - 65 15 96 %  06/25/18 1330 139/60 - - (!) 53 15 95 %  06/25/18 1312 - 98.5 F (36.9 C) Rectal - - -  06/25/18 1300 (!) 132/100 - - 65 19 94 %  06/25/18 1230 129/83 - - 78 16 100 %  06/25/18 1142 (!) 108/97 (!) 97.5 F (36.4 C) Oral 77 20 97 %    3:46 PM Reevaluation with update and discussion. After initial assessment and treatment, an updated evaluation reveals she is more comfortable after treatment, no additional complaints.  Patient and daughter updated on findings and plan. Daleen Bo   Medical Decision Making: Nonspecific abdominal pain, with rectal bleeding.  Hemoglobin is at baseline.  CT imaging today, does not indicate signs of ischemia as it did previously.  Lactate elevated, nonspecific, without clear indicators for infection.  White count is normal, temperature is normal.  CT imaging was not done with IV contrast because of history of IV contrast allergy.  He may require repeat imaging and/or GI consultation for consideration of bowel  ischemia.  She does have marked stool, in her  colon possibly contributing to discomfort.  Empiric treatment, with IV antibiotics begun for mild colitis.  After return of CT imaging, treatment plan changed to antibiotics for colitis, initially I suspected ischemic bowel as cause for discomfort, and elevated lactate.  Repeat lactate testing ordered.  Patient anticoagulated, Xarelto, for atrial fibrillation, likely contributing to rectal bleeding.  Prior diagnosis was ulcerative colitis flare, followed by GI, [facility].  CRITICAL CARE-yes Performed by: Daleen Bo  Nursing Notes Reviewed/ Care Coordinated Applicable Imaging Reviewed Interpretation of Laboratory Data incorporated into ED treatment    4:14 PM-Consult complete with hospitalist. Patient case explained and discussed.  He agrees to admit patient for further evaluation and treatment. Call ended at is 4:40 PM  Final Clinical Impressions(s) / ED Diagnoses   Final diagnoses:  Colitis  Constipation, unspecified constipation type  Rectal bleeding    ED Discharge Orders    None       Daleen Bo, MD 06/25/18 1651

## 2018-06-25 NOTE — ED Notes (Signed)
Transport called to take patient upstairs 

## 2018-06-26 ENCOUNTER — Encounter (HOSPITAL_COMMUNITY): Payer: Self-pay

## 2018-06-26 ENCOUNTER — Encounter (HOSPITAL_COMMUNITY)
Admission: EM | Disposition: A | Discharge status: Hospice | Payer: Self-pay | Source: Home / Self Care | Attending: Internal Medicine

## 2018-06-26 ENCOUNTER — Inpatient Hospital Stay (HOSPITAL_COMMUNITY): Payer: Medicare HMO | Admitting: Certified Registered"

## 2018-06-26 DIAGNOSIS — K5900 Constipation, unspecified: Secondary | ICD-10-CM

## 2018-06-26 DIAGNOSIS — K659 Peritonitis, unspecified: Secondary | ICD-10-CM

## 2018-06-26 DIAGNOSIS — K625 Hemorrhage of anus and rectum: Secondary | ICD-10-CM

## 2018-06-26 HISTORY — PX: FLEXIBLE SIGMOIDOSCOPY: SHX5431

## 2018-06-26 HISTORY — PX: BIOPSY: SHX5522

## 2018-06-26 LAB — CBC
HEMATOCRIT: 25.4 % — AB (ref 36.0–46.0)
Hemoglobin: 8.3 g/dL — ABNORMAL LOW (ref 12.0–15.0)
MCH: 31.3 pg (ref 26.0–34.0)
MCHC: 32.7 g/dL (ref 30.0–36.0)
MCV: 95.8 fL (ref 80.0–100.0)
NRBC: 0 % (ref 0.0–0.2)
Platelets: 227 10*3/uL (ref 150–400)
RBC: 2.65 MIL/uL — AB (ref 3.87–5.11)
RDW: 14.1 % (ref 11.5–15.5)
WBC: 5.2 10*3/uL (ref 4.0–10.5)

## 2018-06-26 LAB — BASIC METABOLIC PANEL
ANION GAP: 7 (ref 5–15)
BUN: 9 mg/dL (ref 8–23)
CHLORIDE: 103 mmol/L (ref 98–111)
CO2: 30 mmol/L (ref 22–32)
CREATININE: 0.55 mg/dL (ref 0.44–1.00)
Calcium: 7.2 mg/dL — ABNORMAL LOW (ref 8.9–10.3)
GFR calc non Af Amer: >60 mL/min (ref >60)
Glucose, Bld: 85 mg/dL (ref 70–99)
POTASSIUM: 4.4 mmol/L (ref 3.5–5.1)
SODIUM: 140 mmol/L (ref 135–145)

## 2018-06-26 SURGERY — SIGMOIDOSCOPY, FLEXIBLE
Anesthesia: Monitor Anesthesia Care

## 2018-06-26 MED ORDER — PHENYLEPHRINE 40 MCG/ML (10ML) SYRINGE FOR IV PUSH (FOR BLOOD PRESSURE SUPPORT)
PREFILLED_SYRINGE | INTRAVENOUS | Status: DC | PRN
  Administered 2018-06-26 (×3): 80 ug via INTRAVENOUS

## 2018-06-26 MED ORDER — MESALAMINE 1.2 G PO TBEC
4.8000 g | DELAYED_RELEASE_TABLET | Freq: Every day | ORAL | Status: DC
  Administered 2018-06-27 – 2018-07-02 (×6): 4.8 g via ORAL
  Filled 2018-06-26 (×7): qty 4

## 2018-06-26 MED ORDER — MESALAMINE 4 G RE ENEM
4.0000 g | ENEMA | Freq: Two times a day (BID) | RECTAL | Status: DC
  Administered 2018-06-26 – 2018-06-28 (×4): 4 g via RECTAL
  Filled 2018-06-26 (×4): qty 60

## 2018-06-26 MED ORDER — PROPOFOL 10 MG/ML IV BOLUS
INTRAVENOUS | Status: AC
  Filled 2018-06-26: qty 20

## 2018-06-26 MED ORDER — LACTATED RINGERS IV SOLN
INTRAVENOUS | Status: DC | PRN
  Administered 2018-06-26: 13:00:00 via INTRAVENOUS

## 2018-06-26 MED ORDER — PROPOFOL 10 MG/ML IV BOLUS
INTRAVENOUS | Status: DC | PRN
  Administered 2018-06-26 (×6): 30 mg via INTRAVENOUS

## 2018-06-26 MED ORDER — SODIUM CHLORIDE 0.9 % IV SOLN: INTRAVENOUS | Status: DC

## 2018-06-26 MED ORDER — METHYLPREDNISOLONE SODIUM SUCC 40 MG IJ SOLR
40.0000 mg | Freq: Two times a day (BID) | INTRAMUSCULAR | Status: DC
  Administered 2018-06-26 – 2018-06-30 (×8): 40 mg via INTRAVENOUS
  Filled 2018-06-26 (×9): qty 1

## 2018-06-26 MED ORDER — LIDOCAINE 2% (20 MG/ML) 5 ML SYRINGE
INTRAMUSCULAR | Status: DC | PRN
  Administered 2018-06-26: 40 mg via INTRAVENOUS

## 2018-06-26 MED ORDER — LACTATED RINGERS IV SOLN
INTRAVENOUS | Status: DC
  Administered 2018-06-26: 13:00:00 via INTRAVENOUS

## 2018-06-26 NOTE — Anesthesia Procedure Notes (Signed)
Procedure Name: MAC Date/Time: 06/26/2018 1:32 PM Performed by: Cynda Familia, CRNA Pre-anesthesia Checklist: Patient identified, Emergency Drugs available, Suction available, Patient being monitored and Timeout performed Patient Re-evaluated:Patient Re-evaluated prior to induction Oxygen Delivery Method: Simple face mask Placement Confirmation: positive ETCO2 and breath sounds checked- equal and bilateral Dental Injury: Teeth and Oropharynx as per pre-operative assessment

## 2018-06-26 NOTE — Op Note (Addendum)
[facility] Patient Name: Felicia Carlson Procedure Date: 06/26/2018 MRN: [id number] Attending MD: Ronnette Juniper , MD Date of Birth: [birth year] CSN: [id number] Age: 84 Admit Type: Inpatient Procedure:                Flexible Sigmoidoscopy Indications:              Lower abdominal pain, Hematochezia,History of                            ulcerative colitis/proctosigmoiditis Providers:                Ronnette Juniper, MD, Carlyn Reichert, RN, Charolette Child,                            Technician, Glenis Smoker, CRNA Referring MD:              Medicines:                Monitored Anesthesia Care Complications:            No immediate complications. Estimated blood loss:                            Minimal. Estimated Blood Loss:     Estimated blood loss was minimal. Procedure:                Pre-Anesthesia Assessment:                           - Prior to the procedure, a History and Physical                            was performed, and patient medications and                            allergies were reviewed. The patient's tolerance of                            previous anesthesia was also reviewed. The risks                            and benefits of the procedure and the sedation                            options and risks were discussed with the patient.                            All questions were answered, and informed consent                            was obtained. Prior Anticoagulants: The patient has                            taken Xarelto (rivaroxaban), last dose was 6 days                            prior to procedure.  ASA Grade Assessment: III - A                            patient with severe systemic disease. After                            reviewing the risks and benefits, the patient was                            deemed in satisfactory condition to undergo the                            procedure.                           After obtaining informed consent, the scope  was                            passed under direct vision. The PCF-[id number]                            ([id number] Olympus peds colonoscope was introduced                            through the anus and advanced to the the left                            transverse colon. The flexible sigmoidoscopy was                            accomplished without difficulty. The patient                            tolerated the procedure well. The quality of the                            bowel preparation was poor. Scope In: Scope Out: Findings:      Hemorrhoids were found on perianal exam.      A diffuse area of severely altered vascular, friable (with contact       bleeding), inflamed, nodular, pseudopolypoid, ulcerated and thickened       folds of the mucosa was found in the rectum, in the recto-sigmoid colon,       in the sigmoid colon, in the descending colon, at the splenic flexure       and in the distal transverse colon. Biopsies were taken with a cold       forceps for histology and placed in separate jars for transverse,       descending colon, sigmoid and rectum.      Non-bleeding internal hemorrhoids were found during endoscopy. The       hemorrhoids were large. Impression:               - Preparation of the colon was poor.                           -  Hemorrhoids found on perianal exam.                           - Altered vascular, friable (with contact                            bleeding), inflamed, nodular, pseudopolypoid,                            ulcerated and thickened folds of the mucosa in the                            rectum, in the recto-sigmoid colon, in the sigmoid                            colon, in the descending colon, at the splenic                            flexure and in the distal transverse colon.                            Biopsied.                           - Non-bleeding internal hemorrhoids.                           - Findings compatible with severe left sided                             ulcerative colitis(possible pancolitis, scope was                            not advanced beyond transverse colon as it was                            performed unprepped and large amount of stool was                            noted). Moderate Sedation:      Patient did not receive moderate sedation for this procedure, but       instead received monitored anesthesia care. Recommendation:           - Resume regular diet.                           - Use Rowasa enemas 1 per rectum BID for 7 days.                           - IV solumedrol 40 mg BID, mesalamine PO, HBsAG and                            TB gold testing. Procedure Code(s):        --- Professional ---  53202, Sigmoidoscopy, flexible; with biopsy, single                            or multiple Diagnosis Code(s):        --- Professional ---                           K64.8, Other hemorrhoids                           K62.5, Hemorrhage of anus and rectum                           K92.2, Gastrointestinal hemorrhage, unspecified                           K52.9, Noninfective gastroenteritis and colitis,                            unspecified                           K62.6, Ulcer of anus and rectum                           K63.3, Ulcer of intestine                           K62.89, Other specified diseases of anus and rectum                           K63.89, Other specified diseases of intestine                           R10.30, Lower abdominal pain, unspecified                           K92.1, Melena (includes Hematochezia) CPT copyright 2018 American [employer] Association. All rights reserved. The codes documented in this report are preliminary and upon coder review may  be revised to meet current compliance requirements. Ronnette Juniper, MD 06/26/2018 2:02:42 PM This report has been signed electronically. Number of Addenda: 0

## 2018-06-26 NOTE — Progress Notes (Signed)
I have reviewed and concur with this student's documentation.   

## 2018-06-26 NOTE — H&P (Signed)
  84 year old African-American female with history of ulcerative proctosigmoiditis, on Xarelto for chronic atrial fibrillation(last dose 06/20/2018) presented with crampy abdominal pain and mucus and blood in stool. Hemoglobin 8.3 today(baseline between 9-10), rectosigmoid thickening on CAT scan along with large stool burden in the ascending and transverse colon. Was on prednisone taper as an outpatient along with mesalamine 1.2 g extended-release 2 pills daily. She is scheduled for a flexible sigmoidoscopy, unprepped to assess severity of ulcerative proctosigmoiditis and take biopsies to rule out superadded infection such as CMV/HSV.  The risks and benefits of the procedure were discussed with the patient in details, he understands and verbalizes consent.

## 2018-06-26 NOTE — Op Note (Signed)
Colonoscopy was performed for lower abdominal pain and hematochezia in a patient with history of ulcerative proctosigmoiditis.  Findings: Large external hemorrhoid found on perianal exam. Diffuse ulcerative colitis involving rectum, sigmoid, descending colon and transverse noted. Mucosa  appeared severely nodular, inflamed, friable with contact bleeding ,had altered vascularity, pseudopolypoid with thickened folds. Biopsies obtained from transverse colon, descending, sigmoid, rectum and sent to pathology in separate jars.   Recommendation: IV Solu-Medrol 40 mg every 12 hours P.o. Mesalamine Rowasa enema twice daily HBsAg and TB Gold testing in anticipation of escalation of therapy if Biologics or immunomodulators are needed in future Regular diet.  Ronnette Juniper, MD

## 2018-06-26 NOTE — Progress Notes (Signed)
PT Cancellation Note  Patient Details Name: Felicia Carlson MRN: [id number] DOB: Oct 20, 1924   Cancelled Treatment:    Reason Eval/Treat Not Completed: Other (comment)--pt just back to floor from colonoscopy, attempt again as schedule permits   [facility] 06/26/2018, 3:02 PM

## 2018-06-26 NOTE — Anesthesia Preprocedure Evaluation (Addendum)
Anesthesia Evaluation  Patient identified by MRN, date of birth, ID band Patient awake    Reviewed: Allergy & Precautions, NPO status , Patient's Chart, lab work & pertinent test results, reviewed documented beta blocker date and time   Airway Mallampati: II  TM Distance: >3 FB Neck ROM: Full    Dental  (+) Teeth Intact, Dental Advisory Given, Chipped   Pulmonary neg pulmonary ROS,    Pulmonary exam normal breath sounds clear to auscultation       Cardiovascular hypertension, Pt. on medications and Pt. on home beta blockers Normal cardiovascular exam+ dysrhythmias Atrial Fibrillation  Rhythm:Irregular Rate:Normal  Echo 06/14/18: Study Conclusions  - Left ventricle: The cavity size was normal. Wall thickness was increased in a pattern of mild LVH. Systolic function was vigorous. The estimated ejection fraction was in the range of 65% to 70%. Wall motion was normal; there were no regional wall motion abnormalities. Doppler parameters are consistent with abnormal left ventricular relaxation (grade 1 diastolic dysfunction). - Aortic valve: There was mild regurgitation. Valve area (VTI): 2.62 cm^2. Valve area (Vmax): 2.38 cm^2. Valve area (Vmean): 2.55 cm^2. - Pulmonary arteries: Systolic pressure was moderately increased. PA peak pressure: 46 mm Hg (S).   Neuro/Psych  Headaches,    GI/Hepatic Neg liver ROS, PUD, GERD  Medicated,ulcerative colitis   Endo/Other  negative endocrine ROS  Renal/GU Renal InsufficiencyRenal disease     Musculoskeletal  (+) Arthritis ,   Abdominal   Peds  Hematology  (+) Blood dyscrasia, anemia ,   Anesthesia Other Findings Day of surgery medications reviewed with the patient.  Reproductive/Obstetrics                           Anesthesia Physical Anesthesia Plan  ASA: III  Anesthesia Plan: MAC   Post-op Pain Management:    Induction: Intravenous  PONV Risk Score  and Plan: 2  Airway Management Planned: Nasal Cannula and Natural Airway  Additional Equipment:   Intra-op Plan:   Post-operative Plan:   Informed Consent: I have reviewed the patients History and Physical, chart, labs and discussed the procedure including the risks, benefits and alternatives for the proposed anesthesia with the patient or authorized representative who has indicated his/her understanding and acceptance.   Dental advisory given  Plan Discussed with: CRNA and Anesthesiologist  Anesthesia Plan Comments: (Patient wishes to suspend DNR while in the perioperative arena.)       Anesthesia Quick Evaluation

## 2018-06-26 NOTE — Progress Notes (Signed)
PROGRESS NOTE    Felicia Carlson  JTT:[id number] DOB: Dec 30, 1924 DOA: 06/25/2018 PCP: Lajean Manes, MD    Brief Narrative: 84 y.o. female with medical history significant of ulcerative colitis recently discharged from the hospital on 06/20/2018 on steroids for an ulcerative colitis flare with infectious work-up negative, chronic atrial fibrillation on Xarelto, her Xarelto was held 4 days prior to admission by her PCP, since discharge she been having crampy abdominal pain with mucousy hematochezia, she has not had a bowel movement for the last 8 to 9 days, she relates her abdominal leg continues to get worse she, her daughter relates she is anorexic is lost about 10 pounds in the last 2 weeks, she has an appointment with [facility] tomorrow but her daughter brought her in due to her severe abdominal pain not able to tolerate anything by mouth, she denies any fever, chills, shortness of breath, chest pain, focal weaknesses.  ED Course:  In the ED she was found to be normotensive with vitals stable, mild leukopenia with a hemoglobin of 9.7 on discharge on 08/30/2017 it was 10.8, lactic acid 4.8, CT scan of the abdomen and pelvis done on admission showed rectosigmoid colon wall thickening, no signs of ischemic colitis, large stool burden in the ascending and transverse colon  Assessment & Plan:   Active Problems:   Lower GI bleed   Ulcerative colitis with complication (HCC)   Abdominal cramping   Colitis, acute   Acute colitis   #1 lower GI bleed patient admitted with mucousy hematochezia with no BM over many days.  She reported that she was having mucousy hematochezia every minute at home.  Since admission it has come down.  CT scan shows large stool burden in transverse colon and ascending colon and mucosal thickening at the rectosigmoid junction.  Patient to go for colonoscopy today.  Patient is n.p.o. for the procedure today.  She is afebrile with normal white count.  Her hemoglobin is 8.3 today  from 9.7 yesterday.  Continue p.o. Steroids.  She is on Xarelto at home which has not been restarted to hematochezia.  #2 history of hypertension patient takes Norvasc and Lasix at home which has been stopped due to soft blood pressure. Malnutrition Type:  Nutrition Problem: Inadequate oral intake Etiology: decreased appetite   Malnutrition Characteristics:  Signs/Symptoms: per patient/family report   Nutrition Interventions:  Interventions: Ensure Enlive (each supplement provides 350kcal and 20 grams of protein)  Estimated body mass index is 29.15 kg/m as calculated from the following:   Height as of 06/15/18: 5\' 1"  (1.549 m).   Weight as of 06/19/18: 70 kg.  DVT prophylaxis: scd Code Status: DO NOT RESUSCITATE family Communication: None Disposition Plan: Pending clinical improvement Consultants: [facility]  Procedures: None Antimicrobials: None  Subjective: Resting in bed feels better than yesterday asking for food appetite coming back abdominal pain better Objective: Vitals:   06/25/18 2124 06/26/18 0512 06/26/18 0945 06/26/18 1030  BP: (!) 146/57 (!) 94/40 (!) 125/53 (!) 123/52  Pulse: 65 66 68 61  Resp: 15 16 20    Temp: 98.3 F (36.8 C) 97.9 F (36.6 C) 98.4 F (36.9 C)   TempSrc: Oral Oral Oral   SpO2: 93% 91% 98%     Intake/Output Summary (Last 24 hours) at 06/26/2018 1207 Last data filed at 06/26/2018 1007 Gross per 24 hour  Intake 737.21 ml  Output 700 ml  Net 37.21 ml   There were no vitals filed for this visit.  Examination:  General  exam: Appears calm and comfortable  Respiratory system: Clear to auscultation. Respiratory effort normal. Cardiovascular system: S1 & S2 heard, RRR. No JVD, murmurs, rubs, gallops or clicks. No pedal edema. Gastrointestinal system: Abdomen is nondistended, soft and nontender. No organomegaly or masses felt. Normal bowel sounds heard. Central nervous system: Alert and oriented. No focal neurological  deficits. Extremities: Symmetric 5 x 5 power. Skin: No rashes, lesions or ulcers Psychiatry: Judgement and insight appear normal. Mood & affect appropriate.     Data Reviewed: I have personally reviewed following labs and imaging studies  CBC: Recent Labs  Lab 06/20/18 0437 06/25/18 1231 06/26/18 0737  WBC 10.0 3.8* 5.2  NEUTROABS  --  3.0  --   HGB 10.8* 9.7* 8.3*  HCT 33.0* 30.0* 25.4*  MCV 93.0 93.5 95.8  PLT 298 297 774   Basic Metabolic Panel: Recent Labs  Lab 06/20/18 0437 06/25/18 1231 06/26/18 0502  NA 132* 138 140  K 3.9 3.8 4.4  CL 102 93* 103  CO2 24 33* 30  GLUCOSE 168* 218* 85  BUN 31* 12 9  CREATININE 0.75 0.79 0.55  CALCIUM 8.7* 8.5* 7.2*   GFR: Estimated Creatinine Clearance: 41.8 mL/min (by C-G formula based on SCr of 0.55 mg/dL). Liver Function Tests: Recent Labs  Lab 06/25/18 1231  AST 20  ALT 19  ALKPHOS 70  BILITOT 0.7  PROT 5.4*  ALBUMIN 2.4*   No results for input(s): LIPASE, AMYLASE in the last 168 hours. No results for input(s): AMMONIA in the last 168 hours. Coagulation Profile: No results for input(s): INR, PROTIME in the last 168 hours. Cardiac Enzymes: No results for input(s): CKTOTAL, CKMB, CKMBINDEX, TROPONINI in the last 168 hours. BNP (last 3 results) No results for input(s): PROBNP in the last 8760 hours. HbA1C: No results for input(s): HGBA1C in the last 72 hours. CBG: Recent Labs  Lab 06/19/18 1657 06/19/18 2057 06/20/18 0753 06/20/18 1139  GLUCAP 192* 196* 123* 186*   Lipid Profile: No results for input(s): CHOL, HDL, LDLCALC, TRIG, CHOLHDL, LDLDIRECT in the last 72 hours. Thyroid Function Tests: No results for input(s): TSH, T4TOTAL, FREET4, T3FREE, THYROIDAB in the last 72 hours. Anemia Panel: No results for input(s): VITAMINB12, FOLATE, FERRITIN, TIBC, IRON, RETICCTPCT in the last 72 hours. Sepsis Labs: Recent Labs  Lab 06/25/18 1302 06/25/18 1708  LATICACIDVEN 4.85* 3.88*    Recent Results  (from the past 240 hour(s))  Blood Culture (routine x 2)     Status: None (Preliminary result)   Collection Time: 06/25/18  4:57 PM  Result Value Ref Range Status   Specimen Description   Final    BLOOD LEFT ANTECUBITAL Performed at [facility] [address] [place], [place] [address]    Special Requests   Final    BOTTLES DRAWN AEROBIC AND ANAEROBIC Blood Culture adequate volume Performed at [facility] [address], [place], [place] [address]    Culture   Final    NO GROWTH < 24 HOURS Performed at [facility] [address] [place], [place] [address]    Report Status PENDING  Incomplete  Blood Culture (routine x 2)     Status: None (Preliminary result)   Collection Time: 06/25/18  7:45 PM  Result Value Ref Range Status   Specimen Description   Final    BLOOD LEFT HAND Performed at [facility] [address] [place],  [address]    Special Requests   Final  BOTTLES DRAWN AEROBIC ONLY Blood Culture adequate volume Performed at [facility] [address], [place], [place] [address]    Culture   Final    NO GROWTH < 24 HOURS Performed at [facility] [address] [place], [place] [address]    Report Status PENDING  Incomplete         Radiology Studies: Ct Abdomen Pelvis Wo Contrast  Result Date: 06/25/2018 CLINICAL DATA:  Intermittent rectal bleeding for 2 weeks. EXAM: CT ABDOMEN AND PELVIS WITHOUT CONTRAST TECHNIQUE: Multidetector CT imaging of the abdomen and pelvis was performed following the standard protocol without IV contrast. COMPARISON:  CT abdomen and pelvis 06/13/2018. FINDINGS: Lower chest: Mild dependent atelectasis is seen in the lung bases. No pleural or pericardial effusion. Calcific aortic and coronary atherosclerosis noted. Hepatobiliary: A few tiny stones are seen layering dependently in the fundus of the gallbladder. No evidence of  cholecystitis. Gallbladder and biliary tree appear normal. Pancreas: No pancreatic ductal dilatation or surrounding inflammatory changes. The pancreas is atrophic as seen on the prior exam. Spleen: Normal in size. Calcifications in the spleen consistent with old granulomatous disease noted. Adrenals/Urinary Tract: The adrenal glands are unremarkable. Mild atrophy of the right kidney is seen. A 0.3 cm in diameter nonobstructing stone in the upper pole of the right kidney is noted. No hydronephrosis or ureteral stones. Urinary bladder appears normal. Stomach/Bowel: The rectosigmoid colon is decompressed but its walls appear thickened. A few scattered diverticula are identified but no focal inflammatory change about a diverticulum is seen. The descending and sigmoid colon are tortuous. There is a large volume of stool in the ascending and transverse colon. No pneumatosis or portal venous gas. The appendix has been removed. The stomach and small bowel appear normal. Vascular/Lymphatic: Aortic atherosclerosis. No enlarged abdominal or pelvic lymph nodes. Reproductive: Status post hysterectomy. No adnexal masses. Other: No free or focal fluid collection. No free intraperitoneal air. Musculoskeletal: No acute or focal abnormality. Scoliosis and multilevel lumbar degenerative change noted. IMPRESSION: Although the rectosigmoid colon is decompressed, its walls appear thickened most consistent with infectious or inflammatory colitis. No CT signs of ischemia are present. Diverticulosis without diverticulitis. Large stool burden ascending and transverse colon. A few tiny gallstones are seen but there is no evidence of cholecystitis. 0.3 cm nonobstructing stone right kidney. Calcific aortic and coronary atherosclerosis. Electronically Signed   By: Inge Rise M.D.   On: 06/25/2018 15:31   Dg Chest Port 1 View  Result Date: 06/25/2018 CLINICAL DATA:  Chest pain, shortness of breath. EXAM: PORTABLE CHEST 1 VIEW  COMPARISON:  Radiographs of July 25, 2016. FINDINGS: Stable cardiomegaly. No pneumothorax or pleural effusion is noted. Elevated right hemidiaphragm is noted. No consolidative process is noted. Degenerative changes are seen involving both glenohumeral joints. IMPRESSION: No acute cardiopulmonary abnormality seen. Electronically Signed   By: Marijo Conception, M.D.   On: 06/25/2018 16:10        Scheduled Meds: . cholecalciferol  1,000 Units Oral Daily  . mesalamine  2.4 g Oral Q breakfast  . polyethylene glycol  17 g Oral BID  . predniSONE  20 mg Oral Q breakfast   Continuous Infusions: . 0.9 % NaCl with KCl 20 mEq / L 75 mL/hr at 06/26/18 0713  . cefTRIAXone (ROCEPHIN)  IV Stopped (06/25/18 1739)  . metronidazole 500 mg (06/26/18 1007)  . sodium chloride       LOS: 1 day     Georgette Shell, MD [facility] If 7PM-7AM, please contact night-coverage [email or web address] Password [email or web address] 06/26/2018, 12:07 PM

## 2018-06-26 NOTE — Transfer of Care (Signed)
Immediate Anesthesia Transfer of Care Note  Patient: Felicia Carlson  Procedure(s) Performed: FLEXIBLE SIGMOIDOSCOPY (N/A ) BIOPSY  Patient Location: PACU and Endoscopy Unit  Anesthesia Type:MAC  Level of Consciousness: sedated  Airway & Oxygen Therapy: Patient Spontanous Breathing and Patient connected to face mask oxygen  Post-op Assessment: Report given to RN and Post -op Vital signs reviewed and stable  Post vital signs: Reviewed and stable  Last Vitals:  Vitals Value Taken Time  BP    Temp    Pulse    Resp    SpO2      Last Pain:  Vitals:   06/26/18 1320  TempSrc: Oral  PainSc: 0-No pain      Patients Stated Pain Goal: 4 (91/47/82 9562)  Complications: No apparent anesthesia complications

## 2018-06-26 NOTE — Brief Op Note (Signed)
06/25/2018 - 06/26/2018  2:02 PM  PATIENT:  Felicia Carlson  84 y.o. female  PRE-OPERATIVE DIAGNOSIS:  ulcerative colitis  POST-OPERATIVE DIAGNOSIS:  large external hemorrhoid, sever left sided colitis, biopsies taken  PROCEDURE:  Procedure(s): FLEXIBLE SIGMOIDOSCOPY (N/A) BIOPSY  SURGEON:  Surgeon(s) and Role:    Ronnette Juniper, MD - Primary  PHYSICIAN ASSISTANT:   ASSISTANTS: Kingsley Plan, RN, Charolette Child, Tech ANESTHESIA:   MAC  EBL:  0 mL   BLOOD ADMINISTERED:none  DRAINS: none   LOCAL MEDICATIONS USED:  NONE  SPECIMEN: Biopsy  DISPOSITION OF SPECIMEN:  PATHOLOGY  COUNTS:  YES  TOURNIQUET:  * No tourniquets in log *  DICTATION: .Dragon Dictation  PLAN OF CARE: Admit to inpatient   PATIENT DISPOSITION:  PACU - hemodynamically stable.   Delay start of Pharmacological VTE agent (>24hrs) due to surgical blood loss or risk of bleeding: yes

## 2018-06-26 NOTE — Progress Notes (Signed)
Initial Nutrition Assessment  DOCUMENTATION CODES:   Not applicable  INTERVENTION:    Monitor for diet advancement/toleration  Ensure Enlive po BID, each supplement provides 350 kcal and 20 grams of protein  NUTRITION DIAGNOSIS:   Inadequate oral intake related to decreased appetite as evidenced by per patient/family report.  GOAL:   Patient will meet greater than or equal to 90% of their needs  MONITOR:   Diet advancement, Labs, PO intake, Supplement acceptance, Weight trends  REASON FOR ASSESSMENT:   Malnutrition Screening Tool    ASSESSMENT:   Patient with PMH significant for ulcerative colitis, CKD, HTN, and breast cancer s/p lumpectomy. Recently discharged from [facility] on 11/1 for UC flare and prescribed steroids. Presents this admission with abdominal cramping and constipation.    Pt endorses having a loss in appetite over the last month due to ongoing abdominal pain. States during this time period she would eat 1-2 meals daily that consisted of yogurts, soup, and 3 Ensures. She claims to limit legume intake because it bothers her, but eats a regular diet other than that. She is currently on a clear liquid diet and tolerating broths. Pt reported upon admission she had not had a BM in 8 days. Last BM recorded on 11/6. Plan for scope today. RD to provide supplements once diet is advanced.   Pt endorses a UBW of 160 lb and a recent wt loss of 40 lb in the last 3 months. Records indicate pt weighed 136 lb on 06/06/18 and 154 lb last admission. Will need to obtain recent admission weight to determine wt loss. Nutrition-Focused physical exam completed.   Medications reviewed and include: Vit D, prednisone, miralax Labs reviewed.   NUTRITION - FOCUSED PHYSICAL EXAM:    Most Recent Value  Orbital Region  Mild depletion  Upper Arm Region  No depletion  Thoracic and Lumbar Region  Unable to assess  Buccal Region  No depletion  Temple Region  Mild depletion  Clavicle Bone  Region  Moderate depletion  Clavicle and Acromion Bone Region  Mild depletion  Scapular Bone Region  Unable to assess  Dorsal Hand  No depletion  Patellar Region  No depletion  Anterior Thigh Region  No depletion  Posterior Calf Region  No depletion  Edema (RD Assessment)  Mild     Diet Order:   Diet Order            Diet full liquid Room service appropriate? Yes; Fluid consistency: Thin  Diet effective now        Diet NPO time specified  Diet effective now              EDUCATION NEEDS:   Not appropriate for education at this time  Skin:  Skin Assessment: Reviewed RN Assessment  Last BM:  06/25/18  Height:   Ht Readings from Last 1 Encounters:  06/15/18 5\' 1"  (1.549 m)    Weight:   Wt Readings from Last 1 Encounters:  06/19/18 70 kg    Ideal Body Weight:  47.7 kg  BMI:  There is no height or weight on file to calculate BMI.  Estimated Nutritional Needs:   Kcal:  1450-1650 kcal  Protein:  70-85 grams  Fluid:  >/= 1.4 L/day   Mariana Single RD, LDN Clinical Nutrition Pager # - [phone]

## 2018-06-26 NOTE — Consult Note (Signed)
Referring Provider:  Dr. Madelaine Bhat  Primary Care Physician:  Lajean Manes, MD Primary Gastroenterologist:  Dr.  Mitchell Heir ([facility])  Reason for Consultation: Ulcerative colitis flareup  HPI: Felicia Carlson is a 84 y.o. 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 Marin Comment at [facility], and started again on a prednisone taper.  The patient was recently hospitalized here and seen by our group for unassigned patients because of intensified symptoms, which settled down while in the hospital and she was discharged on prednisone 20 mg daily about 6 days ago, but did not do well at home because of weakness, ongoing diarrhea (perhaps 4-6 times per day, with some blood).  She therefore was readmitted to the hospital through the emergency room yesterday evening.  In the emergency room last night, the patient had a CT scan which showed a decompressed distal colon for which assessment of inflammation was not possible, but proximally, there was noted to be quite a bit of stool present.  There were no ischemic changes.  It was noted that the patient's hemoglobin had dropped in the intervening 5 days since her recent discharge from a level of 10.8 to a level this morning of 8.3 following hydration.  Admission albumin, prior to hydration, was low at 2.4.   Past Medical History:  Diagnosis Date  . Arthritis   . Cancer [facility]) 2007   breast cancer right side  . Chest pain   . Chronic kidney disease    stent in kidney right side  . Dyspnea   . GERD  (gastroesophageal reflux disease)    sometimes  . Headache    shingles right side of head and right eye  . Hypertension   . Personal history of chemotherapy   . Personal history of radiation therapy     Past Surgical History:  Procedure Laterality Date  . ABDOMINAL HYSTERECTOMY    . APPENDECTOMY    . BREAST LUMPECTOMY Right 2007  . BREAST SURGERY Right 2011   lumpectomy  . EYE SURGERY Bilateral 2015   cataracts  . TONSILLECTOMY     as a child  . TOTAL KNEE ARTHROPLASTY Right 10/26/2014   Procedure: RIGHT TOTAL KNEE ARTHROPLASTY;  Surgeon: [name], MD;  Location: [facility];  Service: Orthopedics;  Laterality: Right;    Prior to Admission medications   Medication Sig Start Date End Date Taking? Authorizing Provider  amLODipine (NORVASC) 10 MG tablet Take 10 mg by mouth daily.   Yes [provider]  Cholecalciferol (VITAMIN D-1000 MAX ST) 1000 units tablet Take 1,000 Units by mouth daily.    Yes [provider]  furosemide (LASIX) 40 MG tablet Take 40 mg by mouth daily.   Yes [provider]  meloxicam (MOBIC) 15 MG tablet Take 15 mg by mouth daily.  05/21/18  Yes [provider]  mesalamine (LIALDA) 1.2 g EC tablet Take 2.4 g by mouth daily with breakfast.  06/06/18  Yes [provider]  metoprolol succinate (TOPROL-XL) 50 MG 24 hr tablet Take 3 tablets (150 mg total) by mouth daily. 06/21/18 07/21/18  Yes Purohit, Konrad Dolores, MD  predniSONE (DELTASONE) 10 MG tablet Take 4 tablets (40 mg total) by mouth daily with breakfast for 5 days, THEN 2 tablets (20 mg total) daily with breakfast for 5 days, THEN 1 tablet (10 mg total) daily with breakfast for 5 days, THEN 0.5 tablets (5 mg total) daily with breakfast for 14 days. 06/21/18 07/20/18 Yes Purohit, Konrad Dolores, MD  oxyCODONE-acetaminophen (ROXICET) 5-325 MG per tablet Take 1-2 tablets by mouth every 4 (four) hours as needed. Patient not taking: Reported on 06/25/2018 10/28/14   [name], MD  Rivaroxaban (XARELTO) 15 MG TABS tablet Take 1 tablet (15 mg total) by mouth daily with supper. Patient not taking: Reported on 06/25/2018 06/20/18   Cristy Folks, MD    Current Facility-Administered Medications  Medication Dose Route Frequency Provider Last Rate Last Dose  . 0.9 % NaCl with KCl 20 mEq/ L  infusion   Intravenous Continuous [name], MD 75 mL/hr at 06/26/18 [phone]    . [MAR Hold] cefTRIAXone (ROCEPHIN) 2 g in sodium chloride 0.9 % 100 mL IVPB  2 g Intravenous Q24H [name], MD   Stopped at 06/25/18 1739  . [MAR Hold] cholecalciferol (VITAMIN D3) tablet 1,000 Units  1,000 Units Oral Daily [name], MD   1,000 Units at 06/26/18 1001  . [MAR Hold] mesalamine (LIALDA) EC tablet 2.4 g  2.4 g Oral Q breakfast Aileen Fass, Tammi Klippel, MD      . Doug Sou Hold] metroNIDAZOLE (FLAGYL) IVPB 500 mg  500 mg Intravenous Q8H [name], MD 100 mL/hr at 06/26/18 1007 500 mg at 06/26/18 1007  . [MAR Hold] ondansetron (ZOFRAN) tablet 4 mg  4 mg Oral Q6H PRN [name], MD       Or  . Doug Sou Hold] ondansetron [facility]) injection 4 mg  4 mg Intravenous Q6H PRN [name], MD   4 mg at 06/26/18 0940  . [MAR Hold] oxyCODONE-acetaminophen (PERCOCET/ROXICET) 5-325 MG per tablet 1-2 tablet  1-2 tablet Oral Q4H PRN [name], MD   2 tablet at 06/26/18 0955  . [MAR Hold] polyethylene glycol (MIRALAX / GLYCOLAX) packet 17 g  17 g Oral BID [name], MD   17 g at 06/26/18 1001  . [MAR Hold] predniSONE (DELTASONE) tablet 20 mg  20 mg Oral Q breakfast [name], MD   20 mg at 06/26/18 1010  . [MAR Hold] sodium chloride 0.9 % bolus 250 mL  250 mL Intravenous Once [name], MD        Allergies as of 06/25/2018 - Review Complete 06/25/2018  Allergen Reaction Noted  . Isovue [iopamidol] Hives and Rash 09/26/2017  . Tetanus immune globulin Swelling 06/01/2014    Family History  Problem  Relation Age of Onset  . Breast cancer Sister 42    Social History   Socioeconomic History  . Marital status: Divorced    Spouse name: Not on file  . Number of children: Not on file  . Years of education: Not on file  . Highest education level: Not on file  Occupational History  . Not on file  Social Needs  . Financial resource strain: Not on file  . Food insecurity:    Worry: Not on file    Inability: Not on file  . Transportation needs:    Medical: Not on file    Non-medical: Not on file  Tobacco Use  . Smoking status: Never Smoker  .  Smokeless tobacco: Never Used  Substance and Sexual Activity  . Alcohol use: Yes    Comment: wine occasional  . Drug use: No  . Sexual activity: Not on file  Lifestyle  . Physical activity:    Days per week: Not on file    Minutes per session: Not on file  . Stress: Not on file  Relationships  . Social connections:    Talks on phone: Not on file    Gets together: Not on file    Attends religious service: Not on file    Active member of club or organization: Not on file    Attends meetings of clubs or organizations: Not on file    Relationship status: Not on file  . Intimate partner violence:    Fear of current or ex partner: Not on file    Emotionally abused: Not on file    Physically abused: Not on file    Forced sexual activity: Not on file  Other Topics Concern  . Not on file  Social History Narrative  . Not on file    Review of Systems: The patient has had some nausea and vomiting, being unable to keep down her MiraLAX that was ordered last night.  Physical Exam: Vital signs in last 24 hours: Temp:  [97.8 F (36.6 C)-98.5 F (36.9 C)] 98.4 F (36.9 C) (11/07 0945) Pulse Rate:  [40-70] 61 (11/07 1030) Resp:  [15-27] 20 (11/07 0945) BP: (94-146)/(40-67) 123/52 (11/07 1030) SpO2:  [91 %-98 %] 98 % (11/07 0945) Last BM Date: 06/26/18  This is a well-preserved African-American female who appears much younger than her  age.  She appears well-nourished and not in any acute distress.  She is anicteric and, despite her low hemoglobin, without frank pallor.  The chest is clear and the heart is without murmur or arrhythmia.  The abdomen is mildly adipose but nontender and without overt distention.  The patient does not look cushingoid despite her recent prednisone use.  Intake/Output from previous day: 11/06 0701 - 11/07 0700 In: 0  Out: 700 [Urine:500; Emesis/NG output:200] Intake/Output this shift: Total I/O In: 737.2 [P.O.:120; I.V.:217.2; IV Piggyback:400] Out: -   Lab Results: Recent Labs    06/25/18 1231 06/26/18 0737  WBC 3.8* 5.2  HGB 9.7* 8.3*  HCT 30.0* 25.4*  PLT 297 227   BMET Recent Labs    06/25/18 1231 06/26/18 0502  NA 138 140  K 3.8 4.4  CL 93* 103  CO2 33* 30  GLUCOSE 218* 85  BUN 12 9  CREATININE 0.79 0.55  CALCIUM 8.5* 7.2*   LFT Recent Labs    06/25/18 1231  PROT 5.4*  ALBUMIN 2.4*  AST 20  ALT 19  ALKPHOS 70  BILITOT 0.7   PT/INR No results for input(s): LABPROT, INR in the last 72 hours.  Studies/Results: Ct Abdomen Pelvis Wo Contrast  Result Date: 06/25/2018 CLINICAL DATA:  Intermittent rectal bleeding for 2 weeks. EXAM: CT ABDOMEN AND PELVIS WITHOUT CONTRAST TECHNIQUE: Multidetector CT imaging of the abdomen and pelvis was performed following the standard protocol without IV contrast. COMPARISON:  CT abdomen and pelvis 06/13/2018. FINDINGS: Lower chest: Mild dependent atelectasis is seen in the lung bases. No pleural or pericardial effusion. Calcific aortic and coronary atherosclerosis noted. Hepatobiliary: A few tiny stones are seen layering dependently in the fundus of the gallbladder. No evidence of cholecystitis. Gallbladder and biliary tree appear normal. Pancreas: No pancreatic ductal dilatation or surrounding inflammatory changes. The pancreas is atrophic  as seen on the prior exam. Spleen: Normal in size. Calcifications in the spleen consistent with old  granulomatous disease noted. Adrenals/Urinary Tract: The adrenal glands are unremarkable. Mild atrophy of the right kidney is seen. A 0.3 cm in diameter nonobstructing stone in the upper pole of the right kidney is noted. No hydronephrosis or ureteral stones. Urinary bladder appears normal. Stomach/Bowel: The rectosigmoid colon is decompressed but its walls appear thickened. A few scattered diverticula are identified but no focal inflammatory change about a diverticulum is seen. The descending and sigmoid colon are tortuous. There is a large volume of stool in the ascending and transverse colon. No pneumatosis or portal venous gas. The appendix has been removed. The stomach and small bowel appear normal. Vascular/Lymphatic: Aortic atherosclerosis. No enlarged abdominal or pelvic lymph nodes. Reproductive: Status post hysterectomy. No adnexal masses. Other: No free or focal fluid collection. No free intraperitoneal air. Musculoskeletal: No acute or focal abnormality. Scoliosis and multilevel lumbar degenerative change noted. IMPRESSION: Although the rectosigmoid colon is decompressed, its walls appear thickened most consistent with infectious or inflammatory colitis. No CT signs of ischemia are present. Diverticulosis without diverticulitis. Large stool burden ascending and transverse colon. A few tiny gallstones are seen but there is no evidence of cholecystitis. 0.3 cm nonobstructing stone right kidney. Calcific aortic and coronary atherosclerosis. Electronically Signed   By: Inge Rise M.D.   On: 06/25/2018 15:31   Dg Chest Port 1 View  Result Date: 06/25/2018 CLINICAL DATA:  Chest pain, shortness of breath. EXAM: PORTABLE CHEST 1 VIEW COMPARISON:  Radiographs of July 25, 2016. FINDINGS: Stable cardiomegaly. No pneumothorax or pleural effusion is noted. Elevated right hemidiaphragm is noted. No consolidative process is noted. Degenerative changes are seen involving both glenohumeral joints.  IMPRESSION: No acute cardiopulmonary abnormality seen. Electronically Signed   By: Marijo Conception, M.D.   On: 06/25/2018 16:10    Impression: Somewhat refractory distal ulcerative colitis, with clinical manifestations of anemia and hypoalbuminemia.  Plan: Sigmoidoscopic evaluation today to help confirm the absence of alternative diagnoses, such as CMV colitis, and to assess the activity and extent of disease.  The nature, purpose, and risks of the procedure were reviewed with the patient and she is agreeable.  She is aware that it will be done by my partner, Dr. Therisa Doyne.  In the meantime, especially taking into account her difficulty keeping down oral medications, I will switch her from oral prednisone to IV Solu-Medrol, in a somewhat higher dose until it appears that the patient has had a clinical response.  I will also repeat a (post hydration) albumin level and obtain a CRP level tomorrow morning, to help assess disease severity.  Depending on the patient's sigmoidoscopic findings, adjunctive topical therapy with mesalamine or hydrocortisone enemas might be appropriate.   LOS: 1 day   Youlanda Mighty Encarnacion Bole  06/26/2018, 1:10 PM   Pager [phone] If no answer or after 5 PM call [phone]

## 2018-06-26 NOTE — Progress Notes (Signed)
Dr Zigmund Daniel aware of recent VS- BP 123/52 and AP HR 61- Clarified whether ordered BP medications appropriate at this time. See new orders entered into Epic per MD.

## 2018-06-27 LAB — CBC WITH DIFFERENTIAL/PLATELET
Abs Immature Granulocytes: 0.05 10*3/uL (ref 0.00–0.07)
BASOS PCT: 0 %
Basophils Absolute: 0 10*3/uL (ref 0.0–0.1)
EOS ABS: 0 10*3/uL (ref 0.0–0.5)
Eosinophils Relative: 0 %
HEMATOCRIT: 29.4 % — AB (ref 36.0–46.0)
Hemoglobin: 9.2 g/dL — ABNORMAL LOW (ref 12.0–15.0)
IMMATURE GRANULOCYTES: 1 %
Lymphocytes Relative: 6 %
Lymphs Abs: 0.4 10*3/uL — ABNORMAL LOW (ref 0.7–4.0)
MCH: 29.8 pg (ref 26.0–34.0)
MCHC: 31.3 g/dL (ref 30.0–36.0)
MCV: 95.1 fL (ref 80.0–100.0)
MONO ABS: 0.3 10*3/uL (ref 0.1–1.0)
MONOS PCT: 5 %
Neutro Abs: 6.5 10*3/uL (ref 1.7–7.7)
Neutrophils Relative %: 88 %
Platelets: 241 10*3/uL (ref 150–400)
RBC: 3.09 MIL/uL — ABNORMAL LOW (ref 3.87–5.11)
RDW: 14.5 % (ref 11.5–15.5)
WBC: 7.3 10*3/uL (ref 4.0–10.5)
nRBC: 0 % (ref 0.0–0.2)

## 2018-06-27 LAB — C-REACTIVE PROTEIN: CRP: 19.8 mg/dL — ABNORMAL HIGH (ref <1.0)

## 2018-06-27 LAB — COMPREHENSIVE METABOLIC PANEL
ALBUMIN: 2.1 g/dL — AB (ref 3.5–5.0)
ALK PHOS: 63 U/L (ref 38–126)
ALT: 14 U/L (ref 0–44)
AST: 8 U/L — AB (ref 15–41)
Anion gap: 10 (ref 5–15)
BILIRUBIN TOTAL: 0.5 mg/dL (ref 0.3–1.2)
BUN: 15 mg/dL (ref 8–23)
CALCIUM: 7.7 mg/dL — AB (ref 8.9–10.3)
CO2: 25 mmol/L (ref 22–32)
CREATININE: 0.81 mg/dL (ref 0.44–1.00)
Chloride: 102 mmol/L (ref 98–111)
GFR calc Af Amer: >60 mL/min (ref >60)
GFR calc non Af Amer: >60 mL/min (ref >60)
GLUCOSE: 163 mg/dL — AB (ref 70–99)
POTASSIUM: 4.1 mmol/L (ref 3.5–5.1)
Sodium: 137 mmol/L (ref 135–145)
TOTAL PROTEIN: 4.7 g/dL — AB (ref 6.5–8.1)

## 2018-06-27 LAB — HEPATITIS B SURFACE ANTIGEN: Hepatitis B Surface Ag: NEGATIVE

## 2018-06-27 NOTE — Progress Notes (Signed)
PROGRESS NOTE    Felicia Carlson  BWI:[id number] DOB: 08-22-1924 DOA: 06/25/2018 PCP: Lajean Manes, MD   Brief Narrative: 84 y.o.femalewith medical history significant ofulcerative colitis recently discharged from the hospital on 06/20/2018 on steroids for an ulcerative colitis flare with infectious work-up negative, chronic atrial fibrillation on Xarelto, her Xarelto was held 4 days prior to admission by her PCP, since discharge she been having crampy abdominal pain with mucousy hematochezia, she has not had a bowel movement for the last 8 to 9 days, she relates her abdominal leg continues to get worse she, her daughter relates she is anorexic is lost about 10 pounds in the last 2 weeks, she has an appointment with [facility] tomorrow but her daughter brought her in due to her severe abdominal pain not able to tolerate anything by mouth, she denies any fever, chills, shortness of breath, chest pain, focal weaknesses.  ED Course: In the ED she was found to be normotensive with vitals stable, mild leukopenia with a hemoglobin of 9.7 on discharge on 08/30/2017 it was 10.8, lactic acid 4.8, CT scan of the abdomen and pelvis done on admission showed rectosigmoid colon wall thickening, no signs of ischemic colitis, large stool burden in the ascending and transverse colon   Assessment & Plan:   Active Problems:   Lower GI bleed   Ulcerative colitis with complication (HCC)   Abdominal cramping   Colitis, acute   Colitis   Abdominal infection (HCC)   Constipation   Rectal bleeding #1 lower GI bleed patient admitted with mucousy hematochezia-sigmoidoscopy 06/26/2018 shows colitis severe.  Biopsies taken pending.  She continues to have frequent liquid stools which is slightly pink in color.  She continues to have abdominal cramps and tenderness.  CT scan shows large stool burden in transverse colon and ascending colon and mucosal thickening at the rectosigmoid junction She is afebrile with normal white  count.  Her hemoglobin is stable.  Continue IV. Steroids, mesalamine, Rowasa per GI.  Hepatitis B surface antigen and QuantiFERON gold testing ordered in case of starting Remicade in the future. She is on Xarelto at home which has not been restarted to hematochezia.DEFER TO GI WHEN TO RESTART XARELTO?  DC IV antibiotics  #2 history of hypertension patient takes Norvasc and Lasix at home which has been stopped due to soft blood pressure.     Malnutrition Type:  Nutrition Problem: Inadequate oral intake Etiology: decreased appetite   Malnutrition Characteristics:  Signs/Symptoms: per patient/family report   Nutrition Interventions:  Interventions: Ensure Enlive (each supplement provides 350kcal and 20 grams of protein)  Estimated body mass index is 29.15 kg/m as calculated from the following:   Height as of 06/15/18: 5\' 1"  (1.549 m).   Weight as of 06/19/18: 70 kg.  DVT prophylaxis:SCD Code Status:DNR Family Communication:NONE Disposition Plan:PER GI Consultants: GI  Procedures: SIGMOIDOSCOPY Antimicrobials: NONE  Subjective: STILL WITH CRAMPS AND DIARRHEA  Objective: Vitals:   06/26/18 1700 06/26/18 1723 06/26/18 2230 06/27/18 0622  BP: (!) 139/58 (!) 141/62 (!) 117/53 (!) 122/51  Pulse: 70 70 65 71  Resp: 20 20 18 18   Temp: 98.2 F (36.8 C) 98.4 F (36.9 C) 97.9 F (36.6 C) (!) 97.5 F (36.4 C)  TempSrc: Oral Oral Oral Oral  SpO2: 95% 94% 97% 96%    Intake/Output Summary (Last 24 hours) at 06/27/2018 1339 Last data filed at 06/27/2018 0644 Gross per 24 hour  Intake 2771.48 ml  Output 500 ml  Net 2271.48 ml   There  were no vitals filed for this visit.  Examination:  General exam: Appears calm and comfortable  Respiratory system: Clear to auscultation. Respiratory effort normal. Cardiovascular system: S1 & S2 heard, RRR. No JVD, murmurs, rubs, gallops or clicks. No pedal edema. Gastrointestinal system: Abdomen is nondistended, soft and MILD tender. No  organomegaly or masses felt. Normal bowel sounds heard. Central nervous system: Alert and oriented. No focal neurological deficits. Extremities: Symmetric 5 x 5 power. Skin: No rashes, lesions or ulcers Psychiatry: Judgement and insight appear normal. Mood & affect appropriate.     Data Reviewed: I have personally reviewed following labs and imaging studies  CBC: Recent Labs  Lab 06/25/18 1231 06/26/18 0737 06/27/18 0512  WBC 3.8* 5.2 7.3  NEUTROABS 3.0  --  6.5  HGB 9.7* 8.3* 9.2*  HCT 30.0* 25.4* 29.4*  MCV 93.5 95.8 95.1  PLT 297 227 324   Basic Metabolic Panel: Recent Labs  Lab 06/25/18 1231 06/26/18 0502 06/27/18 0512  NA 138 140 137  K 3.8 4.4 4.1  CL 93* 103 102  CO2 33* 30 25  GLUCOSE 218* 85 163*  BUN 12 9 15   CREATININE 0.79 0.55 0.81  CALCIUM 8.5* 7.2* 7.7*   GFR: Estimated Creatinine Clearance: 41.3 mL/min (by C-G formula based on SCr of 0.81 mg/dL). Liver Function Tests: Recent Labs  Lab 06/25/18 1231 06/27/18 0512  AST 20 8*  ALT 19 14  ALKPHOS 70 63  BILITOT 0.7 0.5  PROT 5.4* 4.7*  ALBUMIN 2.4* 2.1*   No results for input(s): LIPASE, AMYLASE in the last 168 hours. No results for input(s): AMMONIA in the last 168 hours. Coagulation Profile: No results for input(s): INR, PROTIME in the last 168 hours. Cardiac Enzymes: No results for input(s): CKTOTAL, CKMB, CKMBINDEX, TROPONINI in the last 168 hours. BNP (last 3 results) No results for input(s): PROBNP in the last 8760 hours. HbA1C: No results for input(s): HGBA1C in the last 72 hours. CBG: No results for input(s): GLUCAP in the last 168 hours. Lipid Profile: No results for input(s): CHOL, HDL, LDLCALC, TRIG, CHOLHDL, LDLDIRECT in the last 72 hours. Thyroid Function Tests: No results for input(s): TSH, T4TOTAL, FREET4, T3FREE, THYROIDAB in the last 72 hours. Anemia Panel: No results for input(s): VITAMINB12, FOLATE, FERRITIN, TIBC, IRON, RETICCTPCT in the last 72 hours. Sepsis  Labs: Recent Labs  Lab 06/25/18 1302 06/25/18 1708  LATICACIDVEN 4.85* 3.88*    Recent Results (from the past 240 hour(s))  Blood Culture (routine x 2)     Status: None (Preliminary result)   Collection Time: 06/25/18  4:57 PM  Result Value Ref Range Status   Specimen Description   Final    BLOOD LEFT ANTECUBITAL Performed at [facility] [address], [place], [place] [address]    Special Requests   Final    BOTTLES DRAWN AEROBIC AND ANAEROBIC Blood Culture adequate volume Performed at [facility] [address] [place], [place] [address]    Culture   Final    NO GROWTH < 24 HOURS Performed at [facility] [address] [place], [place] [address]    Report Status PENDING  Incomplete  Urine culture     Status: Abnormal (Preliminary result)   Collection Time: 06/25/18  4:58 PM  Result Value Ref Range Status   Specimen Description   Final    URINE, RANDOM Performed at [facility] [address], [place], [place] [address]    Special Requests  Final    NONE Performed at [facility] [place] [address], [place], [place] [address]    Culture 60,000 COLONIES/mL ESCHERICHIA COLI (A)  Final   Report Status PENDING  Incomplete  Blood Culture (routine x 2)     Status: None (Preliminary result)   Collection Time: 06/25/18  7:45 PM  Result Value Ref Range Status   Specimen Description   Final    BLOOD LEFT HAND Performed at [facility] [address] [place], [place] [address]    Special Requests   Final    BOTTLES DRAWN AEROBIC ONLY Blood Culture adequate volume Performed at [facility] [address] [place], [place] [address]    Culture   Final    NO GROWTH < 24 HOURS Performed at [facility] [address], [place], [place] [address]    Report Status PENDING  Incomplete         Radiology Studies: Ct Abdomen Pelvis Wo  Contrast  Result Date: 06/25/2018 CLINICAL DATA:  Intermittent rectal bleeding for 2 weeks. EXAM: CT ABDOMEN AND PELVIS WITHOUT CONTRAST TECHNIQUE: Multidetector CT imaging of the abdomen and pelvis was performed following the standard protocol without IV contrast. COMPARISON:  CT abdomen and pelvis 06/13/2018. FINDINGS: Lower chest: Mild dependent atelectasis is seen in the lung bases. No pleural or pericardial effusion. Calcific aortic and coronary atherosclerosis noted. Hepatobiliary: A few tiny stones are seen layering dependently in the fundus of the gallbladder. No evidence of cholecystitis. Gallbladder and biliary tree appear normal. Pancreas: No pancreatic ductal dilatation or surrounding inflammatory changes. The pancreas is atrophic as seen on the prior exam. Spleen: Normal in size. Calcifications in the spleen consistent with old granulomatous disease noted. Adrenals/Urinary Tract: The adrenal glands are unremarkable. Mild atrophy of the right kidney is seen. A 0.3 cm in diameter nonobstructing stone in the upper pole of the right kidney is noted. No hydronephrosis or ureteral stones. Urinary bladder appears normal. Stomach/Bowel: The rectosigmoid colon is decompressed but its walls appear thickened. A few scattered diverticula are identified but no focal inflammatory change about a diverticulum is seen. The descending and sigmoid colon are tortuous. There is a large volume of stool in the ascending and transverse colon. No pneumatosis or portal venous gas. The appendix has been removed. The stomach and small bowel appear normal. Vascular/Lymphatic: Aortic atherosclerosis. No enlarged abdominal or pelvic lymph nodes. Reproductive: Status post hysterectomy. No adnexal masses. Other: No free or focal fluid collection. No free intraperitoneal air. Musculoskeletal: No acute or focal abnormality. Scoliosis and multilevel lumbar degenerative change noted. IMPRESSION: Although the rectosigmoid colon is  decompressed, its walls appear thickened most consistent with infectious or inflammatory colitis. No CT signs of ischemia are present. Diverticulosis without diverticulitis. Large stool burden ascending and transverse colon. A few tiny gallstones are seen but there is no evidence of cholecystitis. 0.3 cm nonobstructing stone right kidney. Calcific aortic and coronary atherosclerosis. Electronically Signed   By: Inge Rise M.D.   On: 06/25/2018 15:31   Dg Chest Port 1 View  Result Date: 06/25/2018 CLINICAL DATA:  Chest pain, shortness of breath. EXAM: PORTABLE CHEST 1 VIEW COMPARISON:  Radiographs of July 25, 2016. FINDINGS: Stable cardiomegaly. No pneumothorax or pleural effusion is noted. Elevated right hemidiaphragm is noted. No consolidative process is noted. Degenerative changes are seen involving both glenohumeral joints. IMPRESSION: No acute cardiopulmonary abnormality seen. Electronically Signed   By: Marijo Conception, M.D.   On: 06/25/2018  16:10        Scheduled Meds: . cholecalciferol  1,000 Units Oral Daily  . mesalamine  4.8 g Oral Q breakfast  . mesalamine  4 g Rectal BID  . methylPREDNISolone (SOLU-MEDROL) injection  40 mg Intravenous Q12H  . polyethylene glycol  17 g Oral BID   Continuous Infusions: . cefTRIAXone (ROCEPHIN)  IV 2 g (06/26/18 1717)  . metronidazole 500 mg (06/27/18 0853)  . sodium chloride       LOS: 2 days    Georgette Shell, MD [facility]  If 7PM-7AM, please contact night-coverage [email or web address] Password [email or web address] 06/27/2018, 1:39 PM

## 2018-06-27 NOTE — Evaluation (Signed)
Physical Therapy Evaluation Patient Details Name: Felicia Carlson MRN: [id number] DOB: 06/17/1925 Today's Date: 06/27/2018   History of Present Illness  84 yo female admitted with ulcerative colitis; PMHx: chest pain, abnormal ECG. hx of breast ca, UC, CKD  Clinical Impression  Pt admitted with above diagnosis. Pt currently with functional limitations due to the deficits listed below (see PT Problem List).  Pt will benefit from skilled PT to increase their independence and safety with mobility to allow discharge to the venue listed below.       Follow Up Recommendations Home health PT;Supervision/Assistance - 24 hour    Equipment Recommendations  None recommended by PT    Recommendations for Other Services       Precautions / Restrictions Precautions Precautions: Fall      Mobility  Bed Mobility Overal bed mobility: Needs Assistance Bed Mobility: Supine to Sit     Supine to sit: HOB elevated;Min guard     General bed mobility comments: min/guard for safety, increased time and effort  Transfers Overall transfer level: Needs assistance Equipment used: Rolling walker (2 wheeled) Transfers: Sit to/from Stand Sit to Stand: Min guard         General transfer comment: cues for safety and hand placement, min/guard to steady with transition to walker  Ambulation/Gait Ambulation/Gait assistance: Min guard;Min assist Gait Distance (Feet): 11 Feet Assistive device: Rolling walker (2 wheeled) Gait Pattern/deviations: Step-through pattern;Trunk flexed     General Gait Details: mild instability but no loss of balance, VCs for posture, distance limited by fatigue  Stairs            Wheelchair Mobility    Modified Rankin (Stroke Patients Only)       Balance Overall balance assessment: Needs assistance Sitting-balance support: Feet supported;No upper extremity supported Sitting balance-Leahy Scale: Good     Standing balance support: Bilateral upper extremity  supported Standing balance-Leahy Scale: Poor Standing balance comment: reliant on UEs                             Pertinent Vitals/Pain Pain Assessment: 0-10 Pain Score: 6  Pain Location: abdomen and back Pain Descriptors / Indicators: Grimacing;Cramping Pain Intervention(s): Limited activity within patient's tolerance;Monitored during session    Home Living Family/patient expects to be discharged to:: Private residence Living Arrangements: Alone Available Help at Discharge: Family;Available PRN/intermittently Type of Home: House Home Access: Level entry     Home Layout: One level Home Equipment: Walker - 2 wheels;Cane - single point      Prior Function     Gait / Transfers Assistance Needed: uses RW vs cane  ADL's / Homemaking Assistance Needed: daughter n law assists as needed  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, dispensing        Extremity/Trunk Assessment   Upper Extremity Assessment Upper Extremity Assessment: Generalized weakness    Lower Extremity Assessment Lower Extremity Assessment: Generalized weakness       Communication   Communication: No difficulties  Cognition Arousal/Alertness: Awake/alert Behavior During Therapy: WFL for tasks assessed/performed Overall Cognitive Status: Within Functional Limits for tasks assessed                                        General Comments      Exercises     Assessment/Plan    PT Assessment Patient  needs continued PT services  PT Problem List Decreased strength;Decreased balance;Decreased mobility;Decreased activity tolerance;Decreased knowledge of use of DME       PT Treatment Interventions DME instruction;Gait training;Functional mobility training;Therapeutic activities;Balance training;Patient/family education;Therapeutic exercise    PT Goals (Current goals can be found in the Care Plan section)  Acute Rehab PT Goals Patient Stated Goal: home with  assist PT Goal  Formulation: With patient Time For Goal Achievement: 07/11/18 Potential to Achieve Goals: Good    Frequency Min 3X/week   Barriers to discharge        Co-evaluation               AM-PAC PT "6 Clicks" Daily Activity  Outcome Measure Difficulty turning over in bed (including adjusting bedclothes, sheets and blankets)?: A Little Difficulty moving from lying on back to sitting on the side of the bed? : Unable Difficulty sitting down on and standing up from a chair with arms (e.g., wheelchair, bedside commode, etc,.)?: Unable Help needed moving to and from a bed to chair (including a wheelchair)?: A Little Help needed walking in hospital room?: A Little Help needed climbing 3-5 steps with a railing? : A Lot 6 Click Score: 13    End of Session Equipment Utilized During Treatment: Gait belt Activity Tolerance: Patient limited by fatigue Patient left: with call bell/phone within reach;Other (comment)(in bathroom, RN aware) Nurse Communication: Mobility status PT Visit Diagnosis: Muscle weakness (generalized) (M62.81);Difficulty in walking, not elsewhere classified (R26.2)    Time: 2536-6440 PT Time Calculation (min) (ACUTE ONLY): 14 min   Charges:   PT Evaluation $PT Eval Low Complexity: 1 Low          Kenyon Ana, PT  Pager: [phone] Acute Rehab Dept [facility]): [phone]   06/27/2018   [facility] 06/27/2018, 3:54 PM

## 2018-06-27 NOTE — Progress Notes (Signed)
Yesterday's sigmoidoscopy showed severe colitis, without obvious changes such as pseudomembranes or viral ulcerations to suggest that anything different than a flare of her ulcerative colitis is going on.  The patient has been on IV steroids for only 2 or 3 doses so far, so it is too early to assess effectiveness, but for what it is worth, the patient does not feel subjectively much different than she did prior to initiation of the IV steroids.  She is having frequent, small squirts of liquid stool with just slight pink tingeing, per discussion with the nurse.  The patient does have moderate right-sided abdominal tympany with some discomfort on manual palpation but no exquisite tenderness.  She is lying in bed in no acute distress.  Her white count remains normal and her hemoglobin is stable.  Her sigmoidoscopic findings were reviewed with her.  Plan:  1.  Await pathology results on biopsies 2.  Consider stopping ceftriaxone and metronidazole, unless they are needed for a non-GI indication.  The antibiotics could be contributing to her diarrhea, and place her at risk of C. difficile 3.  If not improved in the next 72 hours, consider Remicade  Cleotis Nipper, M.D. Pager [phone] If no answer or after 5 PM call [phone]

## 2018-06-27 NOTE — Anesthesia Postprocedure Evaluation (Signed)
Anesthesia Post Note  Patient: Felicia Carlson  Procedure(s) Performed: FLEXIBLE SIGMOIDOSCOPY (N/A ) BIOPSY     Patient location during evaluation: PACU Anesthesia Type: MAC Level of consciousness: awake and alert Pain management: pain level controlled Vital Signs Assessment: post-procedure vital signs reviewed and stable Respiratory status: spontaneous breathing, nonlabored ventilation and respiratory function stable Cardiovascular status: stable and blood pressure returned to baseline Postop Assessment: no apparent nausea or vomiting Anesthetic complications: no    Last Vitals:  Vitals:   06/26/18 2230 06/27/18 0622  BP: (!) 117/53 (!) 122/51  Pulse: 65 71  Resp: 18 18  Temp: 36.6 C (!) 36.4 C  SpO2: 97% 96%    Last Pain:  Vitals:   06/27/18 2000  TempSrc:   PainSc: 3                  [name]

## 2018-06-28 LAB — BASIC METABOLIC PANEL
ANION GAP: 8 (ref 5–15)
BUN: 18 mg/dL (ref 8–23)
CHLORIDE: 98 mmol/L (ref 98–111)
CO2: 26 mmol/L (ref 22–32)
Calcium: 7.9 mg/dL — ABNORMAL LOW (ref 8.9–10.3)
Creatinine, Ser: 1.01 mg/dL — ABNORMAL HIGH (ref 0.44–1.00)
GFR calc Af Amer: 55 mL/min — ABNORMAL LOW (ref >60)
GFR calc non Af Amer: 48 mL/min — ABNORMAL LOW (ref >60)
GLUCOSE: 191 mg/dL — AB (ref 70–99)
POTASSIUM: 3.6 mmol/L (ref 3.5–5.1)
Sodium: 132 mmol/L — ABNORMAL LOW (ref 135–145)

## 2018-06-28 LAB — CBC
HEMATOCRIT: 26.5 % — AB (ref 36.0–46.0)
Hemoglobin: 8.4 g/dL — ABNORMAL LOW (ref 12.0–15.0)
MCH: 30.4 pg (ref 26.0–34.0)
MCHC: 31.7 g/dL (ref 30.0–36.0)
MCV: 96 fL (ref 80.0–100.0)
NRBC: 0 % (ref 0.0–0.2)
Platelets: 215 10*3/uL (ref 150–400)
RBC: 2.76 MIL/uL — AB (ref 3.87–5.11)
RDW: 14.3 % (ref 11.5–15.5)
WBC: 4.5 10*3/uL (ref 4.0–10.5)

## 2018-06-28 LAB — URINE CULTURE: Culture: 60000 — AB

## 2018-06-28 MED ORDER — SODIUM CHLORIDE 0.9 % IV SOLN
INTRAVENOUS | Status: DC
  Administered 2018-06-28 – 2018-06-29 (×4): via INTRAVENOUS

## 2018-06-28 MED ORDER — MESALAMINE 4 G RE ENEM
4.0000 g | ENEMA | Freq: Every day | RECTAL | Status: DC
  Administered 2018-06-29 – 2018-07-02 (×4): 4 g via RECTAL
  Filled 2018-06-28 (×4): qty 60

## 2018-06-28 NOTE — Progress Notes (Signed)
Subjective: The patient was seen and examined at bedside. Reports improvement in diarrhea as well as rectal bleeding, however complains of worsening abdominal cramping on the right side and back after eating today.  Objective: Vital signs in last 24 hours: Temp:  [97.8 F (36.6 C)-97.9 F (36.6 C)] 97.9 F (36.6 C) (11/09 0553) Pulse Rate:  [63-96] 96 (11/09 0553) Resp:  [18] 18 (11/09 0553) BP: (125-155)/(71-79) 155/79 (11/09 0553) SpO2:  [95 %-97 %] 95 % (11/09 0553) Weight change:  Last BM Date: 06/27/18  PE:in mild distress due to pain GENERAL:mild pallor   Lab Results: Results for orders placed or performed during the hospital encounter of 06/25/18 (from the past 48 hour(s))  Hepatitis B surface antigen     Status: None   Collection Time: 06/26/18  4:49 PM  Result Value Ref Range   Hepatitis B Surface Ag Negative Negative    Comment: (NOTE) Performed At: [facility] [place], [place] [id number] Rush Farmer MD [id number]   C-reactive protein     Status: Abnormal   Collection Time: 06/27/18  5:12 AM  Result Value Ref Range   CRP 19.8 (H) <1.0 mg/dL    Comment: Performed at [facility], [place] [address] [place], [place] [address]  CBC with Differential/Platelet     Status: Abnormal   Collection Time: 06/27/18  5:12 AM  Result Value Ref Range   WBC 7.3 4.0 - 10.5 K/uL   RBC 3.09 (L) 3.87 - 5.11 MIL/uL   Hemoglobin 9.2 (L) 12.0 - 15.0 g/dL   HCT 29.4 (L) 36.0 - 46.0 %   MCV 95.1 80.0 - 100.0 fL   MCH 29.8 26.0 - 34.0 pg   MCHC 31.3 30.0 - 36.0 g/dL   RDW 14.5 11.5 - 15.5 %   Platelets 241 150 - 400 K/uL   nRBC 0.0 0.0 - 0.2 %   Neutrophils Relative % 88 %   Neutro Abs 6.5 1.7 - 7.7 K/uL   Lymphocytes Relative 6 %   Lymphs Abs 0.4 (L) 0.7 - 4.0 K/uL   Monocytes Relative 5 %   Monocytes Absolute 0.3 0.1 - 1.0 K/uL   Eosinophils Relative 0 %   Eosinophils Absolute 0.0 0.0 - 0.5 K/uL   Basophils Relative 0 %    Basophils Absolute 0.0 0.0 - 0.1 K/uL   WBC Morphology DOHLE BODIES    Immature Granulocytes 1 %   Abs Immature Granulocytes 0.05 0.00 - 0.07 K/uL    Comment: Performed at [facility], [place] [address], [place],  [address]  Comprehensive metabolic panel     Status: Abnormal   Collection Time: 06/27/18  5:12 AM  Result Value Ref Range   Sodium 137 135 - 145 mmol/L   Potassium 4.1 3.5 - 5.1 mmol/L   Chloride 102 98 - 111 mmol/L   CO2 25 22 - 32 mmol/L   Glucose, Bld 163 (H) 70 - 99 mg/dL   BUN 15 8 - 23 mg/dL   Creatinine, Ser 0.81 0.44 - 1.00 mg/dL   Calcium 7.7 (L) 8.9 - 10.3 mg/dL   Total Protein 4.7 (L) 6.5 - 8.1 g/dL   Albumin 2.1 (L) 3.5 - 5.0 g/dL   AST 8 (L) 15 - 41 U/L   ALT 14 0 - 44 U/L   Alkaline Phosphatase 63 38 - 126 U/L   Total Bilirubin 0.5 0.3 - 1.2 mg/dL   GFR calc non Af Amer >60 >60 mL/min  GFR calc Af Amer >60 >60 mL/min    Comment: (NOTE) The eGFR has been calculated using the CKD EPI equation. This calculation has not been validated in all clinical situations. eGFR's persistently <60 mL/min signify possible Chronic Kidney Disease.    Anion gap 10 5 - 15    Comment: Performed at [facility], [place] [address], [place], [place] [address]  CBC     Status: Abnormal   Collection Time: 06/28/18  3:55 AM  Result Value Ref Range   WBC 4.5 4.0 - 10.5 K/uL   RBC 2.76 (L) 3.87 - 5.11 MIL/uL   Hemoglobin 8.4 (L) 12.0 - 15.0 g/dL   HCT 26.5 (L) 36.0 - 46.0 %   MCV 96.0 80.0 - 100.0 fL   MCH 30.4 26.0 - 34.0 pg   MCHC 31.7 30.0 - 36.0 g/dL   RDW 14.3 11.5 - 15.5 %   Platelets 215 150 - 400 K/uL   nRBC 0.0 0.0 - 0.2 %    Comment: Performed at [facility], [place] [address], [place], [place] [address]  Basic metabolic panel     Status: Abnormal   Collection Time: 06/28/18  3:55 AM  Result Value Ref Range   Sodium 132 (L) 135 - 145 mmol/L   Potassium 3.6 3.5 - 5.1 mmol/L   Chloride 98 98 - 111 mmol/L    CO2 26 22 - 32 mmol/L   Glucose, Bld 191 (H) 70 - 99 mg/dL   BUN 18 8 - 23 mg/dL   Creatinine, Ser 1.01 (H) 0.44 - 1.00 mg/dL   Calcium 7.9 (L) 8.9 - 10.3 mg/dL   GFR calc non Af Amer 48 (L) >60 mL/min   GFR calc Af Amer 55 (L) >60 mL/min    Comment: (NOTE) The eGFR has been calculated using the CKD EPI equation. This calculation has not been validated in all clinical situations. eGFR's persistently <60 mL/min signify possible Chronic Kidney Disease.    Anion gap 8 5 - 15    Comment: Performed at [facility], [place] [address], [place], [place] [address]    Studies/Results: No results found.  Medications: I have reviewed the patient's current medications.  Assessment: Severe left-sided colitis noted, biopsies pending. Hemoglobin was 9.2 yesterday and 8.4 today.  Plan: Change diet to full liquid Continue Lialda to 4.8 g daily, decrease Rowasa enema to once a day as patient complains of rectal discomfort and cramps after taking enemas, continue IV Solu-Medrol 40 mg every 12 hours for now along with MiraLAX 17 g twice a day( large stool burden noted in right colon on CAT scan).  Ronnette Juniper 06/28/2018, 2:09 PM   Pager [phone] If no answer or after 5 PM call [phone]

## 2018-06-28 NOTE — Progress Notes (Addendum)
PROGRESS NOTE    Felicia Carlson  ZES:[id number] DOB: 09-27-1924 DOA: 06/25/2018 PCP: Lajean Manes, MD   Brief Narrative:84 y.o.femalewith medical history significant ofulcerative colitis recently discharged from the hospital on 06/20/2018 on steroids for an ulcerative colitis flare with infectious work-up negative, chronic atrial fibrillation on Xarelto, her Xarelto was held 4 days prior to admission by her PCP, since discharge she been having crampy abdominal pain with mucousy hematochezia, she has not had a bowel movement for the last 8 to 9 days, she relates her abdominal leg continues to get worse she, her daughter relates she is anorexic is lost about 10 pounds in the last 2 weeks, she has an appointment with [facility] tomorrow but her daughter brought her in due to her severe abdominal pain not able to tolerate anything by mouth, she denies any fever, chills, shortness of breath, chest pain, focal weaknesses.  ED Course: In the ED she was found to be normotensive with vitals stable, mild leukopenia with a hemoglobin of 9.7 on discharge on 08/30/2017 it was 10.8, lactic acid 4.8, CT scan of the abdomen and pelvis done on admission showed rectosigmoid colon wall thickening, no signs of ischemic colitis, large stool burden in the ascending and transverse colon  Assessment & Plan:   Active Problems:   Lower GI bleed   Ulcerative colitis with complication (HCC)   Abdominal cramping   Colitis, acute   Colitis   Abdominal infection (HCC)   Constipation   Rectal bleeding    #1 diffuse ulcerative colitis of the rectum sigmoid and descending colon and transverse colon-patient on IV steroids Rowasa and is limiting.  She reports feeling better today compared to yesterday.  She had to small liquidy output from the rectum overnight.  Abdominal cramping is better.  Await recommendations from GI.  We will start her on a clear liquid diet today.  IV antibiotics stopped yesterday.  Ambulate increase  activity.  #2 hypertension stable   Malnutrition Type:  Nutrition Problem: Inadequate oral intake Etiology: decreased appetite   Malnutrition Characteristics:  Signs/Symptoms: per patient/family report   Nutrition Interventions:  Interventions: Ensure Enlive (each supplement provides 350kcal and 20 grams of protein)  Estimated body mass index is 29.15 kg/m as calculated from the following:   Height as of 06/15/18: 5\' 1"  (1.549 m).   Weight as of 06/19/18: 70 kg.  DVT prophylaxis: SCD Code Status: DO NOT RESUSCITATE Family Communication: None Disposition Plan.  Pending clinical improvement   Consultants: GI   Procedures: Sigmoidoscopy Antimicrobials none  Subjective: Feeling better than yesterday cramping is decreased had to mucousy output from the rectum overnight.  Objective: Vitals:   06/26/18 2230 06/27/18 0622 06/27/18 2159 06/28/18 0553  BP: (!) 117/53 (!) 122/51 125/71 (!) 155/79  Pulse: 65 71 63 96  Resp: 18 18 18 18   Temp: 97.9 F (36.6 C) (!) 97.5 F (36.4 C) 97.8 F (36.6 C) 97.9 F (36.6 C)  TempSrc: Oral Oral Oral Oral  SpO2: 97% 96% 97% 95%    Intake/Output Summary (Last 24 hours) at 06/28/2018 1122 Last data filed at 06/28/2018 2633 Gross per 24 hour  Intake 625.09 ml  Output -  Net 625.09 ml   There were no vitals filed for this visit.  Examination:  General exam: Appears calm and comfortable  Respiratory system: Clear to auscultation. Respiratory effort normal. Cardiovascular system: S1 & S2 heard, RRR. No JVD, murmurs, rubs, gallops or clicks. No pedal edema. Gastrointestinal system: Abdomen is nondistended, soft and  nontender. No organomegaly or masses felt. Normal bowel sounds heard. Central nervous system: Alert and oriented. No focal neurological deficits. Extremities: Symmetric 5 x 5 power. Skin: No rashes, lesions or ulcers Psychiatry: Judgement and insight appear normal. Mood & affect appropriate.     Data Reviewed: I  have personally reviewed following labs and imaging studies  CBC: Recent Labs  Lab 06/25/18 1231 06/26/18 0737 06/27/18 0512 06/28/18 0355  WBC 3.8* 5.2 7.3 4.5  NEUTROABS 3.0  --  6.5  --   HGB 9.7* 8.3* 9.2* 8.4*  HCT 30.0* 25.4* 29.4* 26.5*  MCV 93.5 95.8 95.1 96.0  PLT 297 227 241 836   Basic Metabolic Panel: Recent Labs  Lab 06/25/18 1231 06/26/18 0502 06/27/18 0512 06/28/18 0355  NA 138 140 137 132*  K 3.8 4.4 4.1 3.6  CL 93* 103 102 98  CO2 33* 30 25 26   GLUCOSE 218* 85 163* 191*  BUN 12 9 15 18   CREATININE 0.79 0.55 0.81 1.01*  CALCIUM 8.5* 7.2* 7.7* 7.9*   GFR: Estimated Creatinine Clearance: 33.1 mL/min (A) (by C-G formula based on SCr of 1.01 mg/dL (H)). Liver Function Tests: Recent Labs  Lab 06/25/18 1231 06/27/18 0512  AST 20 8*  ALT 19 14  ALKPHOS 70 63  BILITOT 0.7 0.5  PROT 5.4* 4.7*  ALBUMIN 2.4* 2.1*   No results for input(s): LIPASE, AMYLASE in the last 168 hours. No results for input(s): AMMONIA in the last 168 hours. Coagulation Profile: No results for input(s): INR, PROTIME in the last 168 hours. Cardiac Enzymes: No results for input(s): CKTOTAL, CKMB, CKMBINDEX, TROPONINI in the last 168 hours. BNP (last 3 results) No results for input(s): PROBNP in the last 8760 hours. HbA1C: No results for input(s): HGBA1C in the last 72 hours. CBG: No results for input(s): GLUCAP in the last 168 hours. Lipid Profile: No results for input(s): CHOL, HDL, LDLCALC, TRIG, CHOLHDL, LDLDIRECT in the last 72 hours. Thyroid Function Tests: No results for input(s): TSH, T4TOTAL, FREET4, T3FREE, THYROIDAB in the last 72 hours. Anemia Panel: No results for input(s): VITAMINB12, FOLATE, FERRITIN, TIBC, IRON, RETICCTPCT in the last 72 hours. Sepsis Labs: Recent Labs  Lab 06/25/18 1302 06/25/18 1708  LATICACIDVEN 4.85* 3.88*    Recent Results (from the past 240 hour(s))  Blood Culture (routine x 2)     Status: None (Preliminary result)   Collection  Time: 06/25/18  4:57 PM  Result Value Ref Range Status   Specimen Description   Final    BLOOD LEFT ANTECUBITAL Performed at [place] [address] [place], [place] [address]    Special Requests   Final    BOTTLES DRAWN AEROBIC AND ANAEROBIC Blood Culture adequate volume Performed at [place] [address], [place], [place] [address]    Culture   Final    NO GROWTH 2 DAYS Performed at [place] [address] [place], [place] [address]    Report Status PENDING  Incomplete  Urine culture     Status: Abnormal   Collection Time: 06/25/18  4:58 PM  Result Value Ref Range Status   Specimen Description   Final    URINE, RANDOM Performed at [place] [address], [place], [place] [address]    Special Requests   Final    NONE Performed at [facility], [place] [address], [place], [place] [address]    Culture 60,000 COLONIES/mL ESCHERICHIA COLI (A)  Final  Report Status 06/28/2018 FINAL  Final   Organism ID, Bacteria ESCHERICHIA COLI (A)  Final      Susceptibility   Escherichia coli - MIC*    AMPICILLIN >=32 RESISTANT Resistant     CEFAZOLIN <=4 SENSITIVE Sensitive     CEFTRIAXONE <=1 SENSITIVE Sensitive     CIPROFLOXACIN <=0.25 SENSITIVE Sensitive     GENTAMICIN >=16 RESISTANT Resistant     IMIPENEM <=0.25 SENSITIVE Sensitive     NITROFURANTOIN <=16 SENSITIVE Sensitive     TRIMETH/SULFA >=320 RESISTANT Resistant     AMPICILLIN/SULBACTAM >=32 RESISTANT Resistant     PIP/TAZO <=4 SENSITIVE Sensitive     Extended ESBL NEGATIVE Sensitive     * 60,000 COLONIES/mL ESCHERICHIA COLI  Blood Culture (routine x 2)     Status: None (Preliminary result)   Collection Time: 06/25/18  7:45 PM  Result Value Ref Range Status   Specimen Description   Final    BLOOD LEFT HAND Performed at [place] [address] [place], [place] [address]    Special Requests   Final      BOTTLES DRAWN AEROBIC ONLY Blood Culture adequate volume Performed at [place] [address], [place], [place] [address]    Culture   Final    NO GROWTH 2 DAYS Performed at [place] [address] [place], [place] [address]    Report Status PENDING  Incomplete         Radiology Studies: No results found.      Scheduled Meds: . cholecalciferol  1,000 Units Oral Daily  . mesalamine  4.8 g Oral Q breakfast  . mesalamine  4 g Rectal BID  . methylPREDNISolone (SOLU-MEDROL) injection  40 mg Intravenous Q12H  . polyethylene glycol  17 g Oral BID   Continuous Infusions: . sodium chloride 150 mL/hr at 06/28/18 0635  . sodium chloride       LOS: 3 days     Georgette Shell, MD [facility]  If 7PM-7AM, please contact night-coverage [email or web address] Password [email or web address] 06/28/2018, 11:22 AM

## 2018-06-29 ENCOUNTER — Encounter (HOSPITAL_COMMUNITY): Payer: Self-pay

## 2018-06-29 LAB — QUANTIFERON-TB GOLD PLUS (RQFGPL)
QUANTIFERON NIL VALUE: 0.04 [IU]/mL
QUANTIFERON TB1 AG VALUE: 0.04 [IU]/mL
QUANTIFERON TB2 AG VALUE: 0.04 [IU]/mL
QuantiFERON Mitogen Value: 0.14 IU/mL

## 2018-06-29 LAB — QUANTIFERON-TB GOLD PLUS: QuantiFERON-TB Gold Plus: UNDETERMINED

## 2018-06-29 MED ORDER — PSYLLIUM 95 % PO PACK
1.0000 | PACK | Freq: Every day | ORAL | Status: DC
  Administered 2018-06-30 – 2018-07-01 (×2): 1 via ORAL
  Filled 2018-06-29 (×2): qty 1

## 2018-06-29 NOTE — Progress Notes (Signed)
Subjective: The patient was seen and examined at bedside. Reports continued generalized abdominal pain but improvement in episodes of diarrhea.  She notes that stools are still liquid and loose however the amount of rectal bleeding has markedly improved.  Objective: Vital signs in last 24 hours: Temp:  [98.3 F (36.8 C)-98.5 F (36.9 C)] 98.5 F (36.9 C) (11/10 1358) Pulse Rate:  [66-91] 66 (11/10 1358) Resp:  [16-17] 17 (11/10 1358) BP: (112-148)/(59-98) 148/85 (11/10 1358) SpO2:  [90 %-95 %] 95 % (11/10 1358) Weight change:  Last BM Date: 06/28/18  PE: Sitting on bed, appears comfortable GENERAL: Obvious pallor ABDOMEN: Mild but generalized abdominal tenderness, normal active bowel sounds EXTREMITIES:no deformity  Lab Results: Results for orders placed or performed during the hospital encounter of 06/25/18 (from the past 48 hour(s))  CBC     Status: Abnormal   Collection Time: 06/28/18  3:55 AM  Result Value Ref Range   WBC 4.5 4.0 - 10.5 K/uL   RBC 2.76 (L) 3.87 - 5.11 MIL/uL   Hemoglobin 8.4 (L) 12.0 - 15.0 g/dL   HCT 26.5 (L) 36.0 - 46.0 %   MCV 96.0 80.0 - 100.0 fL   MCH 30.4 26.0 - 34.0 pg   MCHC 31.7 30.0 - 36.0 g/dL   RDW 14.3 11.5 - 15.5 %   Platelets 215 150 - 400 K/uL   nRBC 0.0 0.0 - 0.2 %    Comment: Performed at [facility], [place] [address] [place], [place] [address]  Basic metabolic panel     Status: Abnormal   Collection Time: 06/28/18  3:55 AM  Result Value Ref Range   Sodium 132 (L) 135 - 145 mmol/L   Potassium 3.6 3.5 - 5.1 mmol/L   Chloride 98 98 - 111 mmol/L   CO2 26 22 - 32 mmol/L   Glucose, Bld 191 (H) 70 - 99 mg/dL   BUN 18 8 - 23 mg/dL   Creatinine, Ser 1.01 (H) 0.44 - 1.00 mg/dL   Calcium 7.9 (L) 8.9 - 10.3 mg/dL   GFR calc non Af Amer 48 (L) >60 mL/min   GFR calc Af Amer 55 (L) >60 mL/min    Comment: (NOTE) The eGFR has been calculated using the CKD EPI equation. This calculation has not been validated in all  clinical situations. eGFR's persistently <60 mL/min signify possible Chronic Kidney Disease.    Anion gap 8 5 - 15    Comment: Performed at [facility], [place] [address] [place], [place] [address]    Studies/Results: No results found.  Medications: I have reviewed the patient's current medications.  Assessment: Severe ulcerative colitis left colon, possible pancolitis(colon not evaluated beyond transverse colon due to poor prep) Normocytic anemia, hemoglobin 8.4 Elevated CRP of 19.8 HBsAg negative, TB gold test pending Biopsies pending  Plan: She does not want her diet to be advanced beyond full liquid diet. Discussed about possibility of starting biologic such as Remicade-after TB Gold test is negative, if there is no improvement in colitis. Currently continued on Rowasa enema 4 g once a day, Lialda 4.8 g daily and Solu-Medrol 40 mg IV every 12 hours. Will discontinue MiraLAX and start patient on Metamucil.   Ronnette Juniper 06/29/2018, 3:40 PM   Pager [phone] If no answer or after 5 PM call [phone]

## 2018-06-29 NOTE — Progress Notes (Signed)
PROGRESS NOTE    Felicia Carlson  PIR:[id number] DOB: 1925-06-05 DOA: 06/25/2018 PCP: Lajean Manes, MD  Brief Narrative:84 y.o.femalewith medical history significant ofulcerative colitis recently discharged from the hospital on 06/20/2018 on steroids for an ulcerative colitis flare with infectious work-up negative, chronic atrial fibrillation on Xarelto, her Xarelto was held 4 days prior to admission by her PCP, since discharge she been having crampy abdominal pain with mucousy hematochezia, she has not had a bowel movement for the last 8 to 9 days, she relates her abdominal leg continues to get worse she, her daughter relates she is anorexic is lost about 10 pounds in the last 2 weeks, she has an appointment with [facility] tomorrow but her daughter brought her in due to her severe abdominal pain not able to tolerate anything by mouth, she denies any fever, chills, shortness of breath, chest pain, focal weaknesses.  ED Course: In the ED she was found to be normotensive with vitals stable, mild leukopenia with a hemoglobin of 9.7 on discharge on 08/30/2017 it was 10.8, lactic acid 4.8, CT scan of the abdomen and pelvis done on admission showed rectosigmoid colon wall thickening, no signs of ischemic colitis, large stool burden in the ascending and transverse colon  Assessment & Plan:   Active Problems:   Lower GI bleed   Ulcerative colitis with complication (HCC)   Abdominal cramping   Colitis, acute   Colitis   Abdominal infection (HCC)   Constipation   Rectal bleeding  #1 diffuse ulcerative colitis of the rectum sigmoid and descending colon and transverse colon-patient on IV steroids Rowasa   She reports feeling better today compared to yesterday.  She had 2-4 liquidy output from the rectum overnight.   Abdominal cramping is better.  Defer to GI with advancing diet.  #2 hypertension stable    Malnutrition Type:  Nutrition Problem: Inadequate oral intake Etiology: decreased  appetite   Malnutrition Characteristics:  Signs/Symptoms: per patient/family report   Nutrition Interventions:  Interventions: Ensure Enlive (each supplement provides 350kcal and 20 grams of protein)  Estimated body mass index is 29.15 kg/m as calculated from the following:   Height as of 06/15/18: 5\' 1"  (1.549 m).   Weight as of 06/19/18: 70 kg.  DVT prophylaxis: SCD Code Status DO NOT RESUSCITATE Family Communication: None Disposition Plan: Pending clinical improvement Consultants: GI  Procedures: Sigmoidoscopy Antimicrobials: None  Subjective: Patient feels better today she reports having to lose BM or drainage from her rectum overnight staff reports she probably had 3-4 times drainage from her rectum overnight.  She reports her pain is better no nausea vomiting.  Objective: Vitals:   06/28/18 0553 06/28/18 1508 06/28/18 2134 06/29/18 0543  BP: (!) 155/79 (!) 117/51 (!) 112/98 (!) 128/59  Pulse: 96 62 91 78  Resp: 18 16 16 16   Temp: 97.9 F (36.6 C) 98.7 F (37.1 C) 98.3 F (36.8 C) 98.4 F (36.9 C)  TempSrc: Oral  Oral Oral  SpO2: 95% 100% 90% 95%    Intake/Output Summary (Last 24 hours) at 06/29/2018 6301 Last data filed at 06/29/2018 0600 Gross per 24 hour  Intake 2445.33 ml  Output 500 ml  Net 1945.33 ml   There were no vitals filed for this visit.  Examination:  General exam: Appears calm and comfortable  Respiratory system: Clear to auscultation. Respiratory effort normal. Cardiovascular system: S1 & S2 heard, RRR. No JVD, murmurs, rubs, gallops or clicks. No pedal edema. Gastrointestinal system: Abdomen is nondistended, soft and MILD tender.  No organomegaly or masses felt. Normal bowel sounds heard. Central nervous system: Alert and oriented. No focal neurological deficits. Extremities: Symmetric 5 x 5 power. Skin: No rashes, lesions or ulcers Psychiatry: Judgement and insight appear normal. Mood & affect appropriate.     Data Reviewed: I  have personally reviewed following labs and imaging studies  CBC: Recent Labs  Lab 06/25/18 1231 06/26/18 0737 06/27/18 0512 06/28/18 0355  WBC 3.8* 5.2 7.3 4.5  NEUTROABS 3.0  --  6.5  --   HGB 9.7* 8.3* 9.2* 8.4*  HCT 30.0* 25.4* 29.4* 26.5*  MCV 93.5 95.8 95.1 96.0  PLT 297 227 241 128   Basic Metabolic Panel: Recent Labs  Lab 06/25/18 1231 06/26/18 0502 06/27/18 0512 06/28/18 0355  NA 138 140 137 132*  K 3.8 4.4 4.1 3.6  CL 93* 103 102 98  CO2 33* 30 25 26   GLUCOSE 218* 85 163* 191*  BUN 12 9 15 18   CREATININE 0.79 0.55 0.81 1.01*  CALCIUM 8.5* 7.2* 7.7* 7.9*   GFR: Estimated Creatinine Clearance: 33.1 mL/min (A) (by C-G formula based on SCr of 1.01 mg/dL (H)). Liver Function Tests: Recent Labs  Lab 06/25/18 1231 06/27/18 0512  AST 20 8*  ALT 19 14  ALKPHOS 70 63  BILITOT 0.7 0.5  PROT 5.4* 4.7*  ALBUMIN 2.4* 2.1*   No results for input(s): LIPASE, AMYLASE in the last 168 hours. No results for input(s): AMMONIA in the last 168 hours. Coagulation Profile: No results for input(s): INR, PROTIME in the last 168 hours. Cardiac Enzymes: No results for input(s): CKTOTAL, CKMB, CKMBINDEX, TROPONINI in the last 168 hours. BNP (last 3 results) No results for input(s): PROBNP in the last 8760 hours. HbA1C: No results for input(s): HGBA1C in the last 72 hours. CBG: No results for input(s): GLUCAP in the last 168 hours. Lipid Profile: No results for input(s): CHOL, HDL, LDLCALC, TRIG, CHOLHDL, LDLDIRECT in the last 72 hours. Thyroid Function Tests: No results for input(s): TSH, T4TOTAL, FREET4, T3FREE, THYROIDAB in the last 72 hours. Anemia Panel: No results for input(s): VITAMINB12, FOLATE, FERRITIN, TIBC, IRON, RETICCTPCT in the last 72 hours. Sepsis Labs: Recent Labs  Lab 06/25/18 1302 06/25/18 1708  LATICACIDVEN 4.85* 3.88*    Recent Results (from the past 240 hour(s))  Blood Culture (routine x 2)     Status: None (Preliminary result)   Collection  Time: 06/25/18  4:57 PM  Result Value Ref Range Status   Specimen Description   Final    BLOOD LEFT ANTECUBITAL Performed at [facility] [address], [place], [place] [address]    Special Requests   Final    BOTTLES DRAWN AEROBIC AND ANAEROBIC Blood Culture adequate volume Performed at [facility] [address], [place], [place] [address]    Culture   Final    NO GROWTH 3 DAYS Performed at [facility], [place] [address] [place], [place] [address]    Report Status PENDING  Incomplete  Urine culture     Status: Abnormal   Collection Time: 06/25/18  4:58 PM  Result Value Ref Range Status   Specimen Description   Final    URINE, RANDOM Performed at [facility] [address], [place],  [address]    Special Requests   Final    NONE Performed at [facility], [place] [address], [place], [place] [address]    Culture 60,000 COLONIES/mL ESCHERICHIA COLI (A)  Final  Report Status 06/28/2018 FINAL  Final   Organism ID, Bacteria ESCHERICHIA COLI (A)  Final      Susceptibility   Escherichia coli - MIC*    AMPICILLIN >=32 RESISTANT Resistant     CEFAZOLIN <=4 SENSITIVE Sensitive     CEFTRIAXONE <=1 SENSITIVE Sensitive     CIPROFLOXACIN <=0.25 SENSITIVE Sensitive     GENTAMICIN >=16 RESISTANT Resistant     IMIPENEM <=0.25 SENSITIVE Sensitive     NITROFURANTOIN <=16 SENSITIVE Sensitive     TRIMETH/SULFA >=320 RESISTANT Resistant     AMPICILLIN/SULBACTAM >=32 RESISTANT Resistant     PIP/TAZO <=4 SENSITIVE Sensitive     Extended ESBL NEGATIVE Sensitive     * 60,000 COLONIES/mL ESCHERICHIA COLI  Blood Culture (routine x 2)     Status: None (Preliminary result)   Collection Time: 06/25/18  7:45 PM  Result Value Ref Range Status   Specimen Description   Final    BLOOD LEFT HAND Performed at [facility] [address], [place], [place] [address]    Special Requests   Final     BOTTLES DRAWN AEROBIC ONLY Blood Culture adequate volume Performed at [facility] [address] [place], [place] [address]    Culture   Final    NO GROWTH 3 DAYS Performed at [facility], [place] [address], [place], [place] [address]    Report Status PENDING  Incomplete         Radiology Studies: No results found.      Scheduled Meds: . cholecalciferol  1,000 Units Oral Daily  . mesalamine  4.8 g Oral Q breakfast  . mesalamine  4 g Rectal Daily  . methylPREDNISolone (SOLU-MEDROL) injection  40 mg Intravenous Q12H  . polyethylene glycol  17 g Oral BID   Continuous Infusions: . sodium chloride 50 mL/hr at 06/28/18 2134  . sodium chloride       LOS: 4 days     Georgette Shell, MD [facility]  If 7PM-7AM, please contact night-coverage [email or web address] Password [email or web address] 06/29/2018, 9:22 AM

## 2018-06-30 LAB — CBC
HCT: 25.2 % — ABNORMAL LOW (ref 36.0–46.0)
Hemoglobin: 8.5 g/dL — ABNORMAL LOW (ref 12.0–15.0)
MCH: 30.5 pg (ref 26.0–34.0)
MCHC: 33.7 g/dL (ref 30.0–36.0)
MCV: 90.3 fL (ref 80.0–100.0)
Platelets: 146 10*3/uL — ABNORMAL LOW (ref 150–400)
RBC: 2.79 MIL/uL — AB (ref 3.87–5.11)
RDW: 14.3 % (ref 11.5–15.5)
WBC: 4.7 10*3/uL (ref 4.0–10.5)
nRBC: 0 % (ref 0.0–0.2)

## 2018-06-30 LAB — CULTURE, BLOOD (ROUTINE X 2)
CULTURE: NO GROWTH
Culture: NO GROWTH
SPECIAL REQUESTS: ADEQUATE
SPECIAL REQUESTS: ADEQUATE

## 2018-06-30 LAB — COMPREHENSIVE METABOLIC PANEL
ALK PHOS: 66 U/L (ref 38–126)
ALT: 14 U/L (ref 0–44)
ANION GAP: 5 (ref 5–15)
AST: 16 U/L (ref 15–41)
Albumin: 1.9 g/dL — ABNORMAL LOW (ref 3.5–5.0)
BILIRUBIN TOTAL: 0.5 mg/dL (ref 0.3–1.2)
BUN: 11 mg/dL (ref 8–23)
CALCIUM: 8 mg/dL — AB (ref 8.9–10.3)
CO2: 27 mmol/L (ref 22–32)
Chloride: 105 mmol/L (ref 98–111)
Creatinine, Ser: 0.63 mg/dL (ref 0.44–1.00)
Glucose, Bld: 203 mg/dL — ABNORMAL HIGH (ref 70–99)
Potassium: 5 mmol/L (ref 3.5–5.1)
SODIUM: 137 mmol/L (ref 135–145)
TOTAL PROTEIN: 4.3 g/dL — AB (ref 6.5–8.1)

## 2018-06-30 LAB — MAGNESIUM: MAGNESIUM: 1.9 mg/dL (ref 1.7–2.4)

## 2018-06-30 MED ORDER — PREDNISONE 20 MG PO TABS
40.0000 mg | ORAL_TABLET | Freq: Once | ORAL | Status: AC
  Administered 2018-06-30: 40 mg via ORAL
  Filled 2018-06-30: qty 2

## 2018-06-30 MED ORDER — SACCHAROMYCES BOULARDII 250 MG PO CAPS
250.0000 mg | ORAL_CAPSULE | Freq: Two times a day (BID) | ORAL | Status: DC
  Administered 2018-06-30 – 2018-07-02 (×5): 250 mg via ORAL
  Filled 2018-06-30 (×5): qty 1

## 2018-06-30 MED ORDER — ENSURE ENLIVE PO LIQD
237.0000 mL | Freq: Two times a day (BID) | ORAL | Status: DC
  Administered 2018-06-30 – 2018-07-02 (×6): 237 mL via ORAL

## 2018-06-30 NOTE — Progress Notes (Signed)
Attempted PIV access with [place]. Unable to thread catheter. Unable to find any other appropriate vein for PIV access. RN notified and states she will talk to MD.

## 2018-06-30 NOTE — Progress Notes (Signed)
PROGRESS NOTE    Felicia Carlson  GGY:[id number] DOB: 07-03-1925 DOA: 06/25/2018 PCP: Lajean Manes, MD    Brief Narrative:84 y.o.femalewith medical history significant ofulcerative colitis recently discharged from the hospital on 06/20/2018 on steroids for an ulcerative colitis flare with infectious work-up negative, chronic atrial fibrillation on Xarelto, her Xarelto was held 4 days prior to admission by her PCP, since discharge she been having crampy abdominal pain with mucousy hematochezia, she has not had a bowel movement for the last 8 to 9 days, she relates her abdominal leg continues to get worse she, her daughter relates she is anorexic is lost about 10 pounds in the last 2 weeks, she has an appointment with [facility] tomorrow but her daughter brought her in due to her severe abdominal pain not able to tolerate anything by mouth, she denies any fever, chills, shortness of breath, chest pain, focal weaknesses.  ED Course: In the ED she was found to be normotensive with vitals stable, mild leukopenia with a hemoglobin of 9.7 on discharge on 08/30/2017 it was 10.8, lactic acid 4.8, CT scan of the abdomen and pelvis done on admission showed rectosigmoid colon wall thickening, no signs of ischemic colitis, large stool burden in the ascending and transverse colon   Assessment & Plan:   Active Problems:   Lower GI bleed   Ulcerative colitis with complication (HCC)   Abdominal cramping   Colitis, acute   Colitis   Abdominal infection (HCC)   Constipation   Rectal bleeding   #1 diffuse ulcerative colitis of the rectum sigmoid and descending colon and transverse colon-patient on IV steroids Rowasa.  She is reporting increased pain since yesterday.  However her diarrhea has been decreased.  Noted IV steroids decreased yesterday.  Await GI recommendations. #2 hypertension stable  Malnutrition Type:  Nutrition Problem: Inadequate oral intake Etiology: decreased  appetite   Malnutrition Characteristics:  Signs/Symptoms: per patient/family report   Nutrition Interventions:  Interventions: Ensure Enlive (each supplement provides 350kcal and 20 grams of protein)  Estimated body mass index is 29.15 kg/m as calculated from the following:   Height as of 06/15/18: 5\' 1"  (1.549 m).   Weight as of 06/19/18: 70 kg.  DVT prophylaxis: Lovenox Code Status: DO NOT RESUSCITATE Family Communication: None Disposition Plan:.  Pending clinical improvement   Consultants: GI  Procedures: Sigmoidoscopy Antimicrobials: None  Subjective: Complains of increased pain all throughout the abdomen vomiting decreased diarrhea overnight   Objective: Vitals:   06/29/18 0543 06/29/18 1358 06/29/18 2129 06/30/18 0633  BP: (!) 128/59 (!) 148/85 (!) 145/55 (!) 156/70  Pulse: 78 66 85 65  Resp: 16 17 18 18   Temp: 98.4 F (36.9 C) 98.5 F (36.9 C) 98.5 F (36.9 C) 98.5 F (36.9 C)  TempSrc: Oral Oral Oral Oral  SpO2: 95% 95% 93% 94%    Intake/Output Summary (Last 24 hours) at 06/30/2018 0901 Last data filed at 06/30/2018 0350 Gross per 24 hour  Intake 2415 ml  Output 600 ml  Net 1815 ml   There were no vitals filed for this visit.  Examination:  General exam: Appears calm and comfortable  Respiratory system: Clear to auscultation. Respiratory effort normal. Cardiovascular system: S1 & S2 heard, RRR. No JVD, murmurs, rubs, gallops or clicks. No pedal edema. Gastrointestinal system: Abdomen is nondistended, soft and generalized tender. No organomegaly or masses felt. Normal bowel sounds heard. Central nervous system: Alert and oriented. No focal neurological deficits. Extremities: Symmetric 5 x 5 power. Skin: No rashes, lesions  or ulcers Psychiatry: Judgement and insight appear normal. Mood & affect appropriate.     Data Reviewed: I have personally reviewed following labs and imaging studies  CBC: Recent Labs  Lab 06/25/18 1231 06/26/18 0737  06/27/18 0512 06/28/18 0355 06/30/18 0446  WBC 3.8* 5.2 7.3 4.5 4.7  NEUTROABS 3.0  --  6.5  --   --   HGB 9.7* 8.3* 9.2* 8.4* 8.5*  HCT 30.0* 25.4* 29.4* 26.5* 25.2*  MCV 93.5 95.8 95.1 96.0 90.3  PLT 297 227 241 215 226*   Basic Metabolic Panel: Recent Labs  Lab 06/25/18 1231 06/26/18 0502 06/27/18 0512 06/28/18 0355 06/30/18 0446  NA 138 140 137 132* 137  K 3.8 4.4 4.1 3.6 5.0  CL 93* 103 102 98 105  CO2 33* 30 25 26 27   GLUCOSE 218* 85 163* 191* 203*  BUN 12 9 15 18 11   CREATININE 0.79 0.55 0.81 1.01* 0.63  CALCIUM 8.5* 7.2* 7.7* 7.9* 8.0*  MG  --   --   --   --  1.9   GFR: Estimated Creatinine Clearance: 41.8 mL/min (by C-G formula based on SCr of 0.63 mg/dL). Liver Function Tests: Recent Labs  Lab 06/25/18 1231 06/27/18 0512 06/30/18 0446  AST 20 8* 16  ALT 19 14 14   ALKPHOS 70 63 66  BILITOT 0.7 0.5 0.5  PROT 5.4* 4.7* 4.3*  ALBUMIN 2.4* 2.1* 1.9*   No results for input(s): LIPASE, AMYLASE in the last 168 hours. No results for input(s): AMMONIA in the last 168 hours. Coagulation Profile: No results for input(s): INR, PROTIME in the last 168 hours. Cardiac Enzymes: No results for input(s): CKTOTAL, CKMB, CKMBINDEX, TROPONINI in the last 168 hours. BNP (last 3 results) No results for input(s): PROBNP in the last 8760 hours. HbA1C: No results for input(s): HGBA1C in the last 72 hours. CBG: No results for input(s): GLUCAP in the last 168 hours. Lipid Profile: No results for input(s): CHOL, HDL, LDLCALC, TRIG, CHOLHDL, LDLDIRECT in the last 72 hours. Thyroid Function Tests: No results for input(s): TSH, T4TOTAL, FREET4, T3FREE, THYROIDAB in the last 72 hours. Anemia Panel: No results for input(s): VITAMINB12, FOLATE, FERRITIN, TIBC, IRON, RETICCTPCT in the last 72 hours. Sepsis Labs: Recent Labs  Lab 06/25/18 1302 06/25/18 1708  LATICACIDVEN 4.85* 3.88*    Recent Results (from the past 240 hour(s))  Blood Culture (routine x 2)     Status: None  (Preliminary result)   Collection Time: 06/25/18  4:57 PM  Result Value Ref Range Status   Specimen Description   Final    BLOOD LEFT ANTECUBITAL Performed at [facility] [address], [place], [place] [address]    Special Requests   Final    BOTTLES DRAWN AEROBIC AND ANAEROBIC Blood Culture adequate volume Performed at [facility] [address] [place], [place] [address]    Culture   Final    NO GROWTH 4 DAYS Performed at [facility], [place] [address], [place], [place] [address]    Report Status PENDING  Incomplete  Urine culture     Status: Abnormal   Collection Time: 06/25/18  4:58 PM  Result Value Ref Range Status   Specimen Description   Final    URINE, RANDOM Performed at [facility] [address], [place], [place] [address]    Special Requests   Final    NONE Performed at [facility], [place] [address], [place],  [address]  Culture 60,000 COLONIES/mL ESCHERICHIA COLI (A)  Final   Report Status 06/28/2018 FINAL  Final   Organism ID, Bacteria ESCHERICHIA COLI (A)  Final      Susceptibility   Escherichia coli - MIC*    AMPICILLIN >=32 RESISTANT Resistant     CEFAZOLIN <=4 SENSITIVE Sensitive     CEFTRIAXONE <=1 SENSITIVE Sensitive     CIPROFLOXACIN <=0.25 SENSITIVE Sensitive     GENTAMICIN >=16 RESISTANT Resistant     IMIPENEM <=0.25 SENSITIVE Sensitive     NITROFURANTOIN <=16 SENSITIVE Sensitive     TRIMETH/SULFA >=320 RESISTANT Resistant     AMPICILLIN/SULBACTAM >=32 RESISTANT Resistant     PIP/TAZO <=4 SENSITIVE Sensitive     Extended ESBL NEGATIVE Sensitive     * 60,000 COLONIES/mL ESCHERICHIA COLI  Blood Culture (routine x 2)     Status: None (Preliminary result)   Collection Time: 06/25/18  7:45 PM  Result Value Ref Range Status   Specimen Description   Final    BLOOD LEFT HAND Performed at [facility] [address] [place], [place]  [address]    Special Requests   Final    BOTTLES DRAWN AEROBIC ONLY Blood Culture adequate volume Performed at [facility] [address], [place], [place] [address]    Culture   Final    NO GROWTH 4 DAYS Performed at [facility], [place] [address] [place],  [place] [address]    Report Status PENDING  Incomplete         Radiology Studies: No results found.      Scheduled Meds: . cholecalciferol  1,000 Units Oral Daily  . feeding supplement (ENSURE ENLIVE)  237 mL Oral BID BM  . mesalamine  4.8 g Oral Q breakfast  . mesalamine  4 g Rectal Daily  . methylPREDNISolone (SOLU-MEDROL) injection  40 mg Intravenous Q12H  . psyllium  1 packet Oral Daily  . saccharomyces boulardii  250 mg Oral BID   Continuous Infusions: . sodium chloride 50 mL/hr at 06/30/18 0600  . sodium chloride       LOS: 5 days     Georgette Shell, MD [facility] If 7PM-7AM, please contact night-coverage [email or web address] Password [email or web address] 06/30/2018, 9:01 AM

## 2018-06-30 NOTE — Progress Notes (Addendum)
Physical Therapy Treatment Patient Details Name: Felicia Carlson MRN: [id number] DOB: 01-05-1925 Today's Date: 06/30/2018    History of Present Illness 84 yo female admitted with ulcerative colitis; PMHx: chest pain, abnormal ECG. hx of breast ca, UC, CKD    PT Comments    Pt continues to be limited by fatigue and pain; cooperative and willing to work on mobility with PT however distance limited; if pt does not progress next session may need to consider SNF; L UE edematous as compared to last PT visit on Friday--discussed with RN, fluids stopped and RN to contact IV team;   Follow Up Recommendations  Home health PT;Supervision/Assistance - 24 hour     Equipment Recommendations  None recommended by PT    Recommendations for Other Services       Precautions / Restrictions Precautions Precautions: Fall Restrictions Weight Bearing Restrictions: No    Mobility  Bed Mobility Overal bed mobility: Needs Assistance Bed Mobility: Supine to Sit     Supine to sit: Supervision;HOB elevated     General bed mobility comments: for safety,incr time needed  Transfers Overall transfer level: Needs assistance Equipment used: Rolling walker (2 wheeled);None Transfers: Sit to/from Stand Sit to Stand: Supervision;Min guard Stand pivot transfers: Min guard       General transfer comment: cues for safety and hand placement, min/guard to steady with transition to walker; min/guard for balance for  stand pivot bed to [facility] without device  Ambulation/Gait Ambulation/Gait assistance: Min guard Gait Distance (Feet): 6 Feet Assistive device: Rolling walker (2 wheeled) Gait Pattern/deviations: Step-through pattern;Trunk flexed     General Gait Details: mild instability but no loss of balance, VCs for posture, distance limited by fatigue and pain   Stairs             Wheelchair Mobility    Modified Rankin (Stroke Patients Only)       Balance   Sitting-balance support: Feet  supported;No upper extremity supported Sitting balance-Leahy Scale: Good     Standing balance support: Bilateral upper extremity supported;Single extremity supported Standing balance-Leahy Scale: Poor Standing balance comment: reliant on UEs                            Cognition Arousal/Alertness: Awake/alert Behavior During Therapy: WFL for tasks assessed/performed Overall Cognitive Status: Within Functional Limits for tasks assessed                                        Exercises      General Comments        Pertinent Vitals/Pain Pain Assessment: Faces Faces Pain Scale: Hurts little more Pain Location: abdomen and back Pain Descriptors / Indicators: Grimacing;Cramping Pain Intervention(s): Limited activity within patient's tolerance;Monitored during session;Repositioned;Heat applied(Kpad to back)    Home Living                      Prior Function            PT Goals (current goals can now be found in the care plan section) Acute Rehab PT Goals Patient Stated Goal: home with  assist PT Goal Formulation: With patient Time For Goal Achievement: 07/11/18 Potential to Achieve Goals: Good Progress towards PT goals: Progressing toward goals    Frequency    Min 3X/week      PT Plan Current plan remains  appropriate    Co-evaluation              AM-PAC PT "6 Clicks" Daily Activity  Outcome Measure  Difficulty turning over in bed (including adjusting bedclothes, sheets and blankets)?: A Little Difficulty moving from lying on back to sitting on the side of the bed? : A Lot Difficulty sitting down on and standing up from a chair with arms (e.g., wheelchair, bedside commode, etc,.)?: Unable Help needed moving to and from a bed to chair (including a wheelchair)?: A Little Help needed walking in hospital room?: A Little Help needed climbing 3-5 steps with a railing? : A Lot 6 Click Score: 14    End of Session Equipment  Utilized During Treatment: Gait belt Activity Tolerance: Patient limited by fatigue;Patient limited by pain Patient left: in chair;with call bell/phone within reach Nurse Communication: Mobility status PT Visit Diagnosis: Muscle weakness (generalized) (M62.81);Difficulty in walking, not elsewhere classified (R26.2)     Time: 1030-1108 PT Time Calculation (min) (ACUTE ONLY): 38 min  Charges:  $Therapeutic Activity: 38-52 mins                     Kenyon Ana, PT  Pager: [phone] Acute Rehab Dept [facility]): [phone]   06/30/2018    [facility] 06/30/2018, 1:15 PM

## 2018-06-30 NOTE — Progress Notes (Signed)
Responded to PIV consult. Pt requested to use restroom and VAS  Team return later

## 2018-06-30 NOTE — Progress Notes (Signed)
[name] 9:42 AM  Subjective: Patient seen and examined and hospital computer chart reviewed and case discussed with my partner Dr. Therisa Doyne and currently she is having some pain more than diarrhea but no other new complaints and we discussed her Lialda at home and we are awaiting the biopsy to make sure she does not have CMV before starting Remicade and also awaiting TB test and she has no new complaints and she is tolerating clear liquids  Objective: Signs stable afebrile no acute distress exam pertinent for her abdomen being soft no obvious tenderness occasional bowel sounds labs okay White count not increased despite steroids  Assessment: Probable ulcerative colitis  Plan: Consider increasing steroids further await biopsy for CMV and TB tests to determine if Remicade is the next option and will continue to follow  [facility]  Pager [phone] After 5PM or if no answer call [phone]

## 2018-06-30 NOTE — Progress Notes (Signed)
Notified MD concerning pt's iv infiltrating and arm swelling.  IV team was consulted but were unable to gain access. Orders given, will continue to monitor swelling.

## 2018-06-30 NOTE — Progress Notes (Signed)
[facility]  Patient Status: Active (receiving services up to time of hospitalization)  [facility] is providing the following services: PT  If patient discharges after hours, please call [phone].   Felicia Carlson 06/30/2018, 9:31 AM

## 2018-07-01 MED ORDER — PREDNISONE 20 MG PO TABS
40.0000 mg | ORAL_TABLET | Freq: Every day | ORAL | Status: DC
  Administered 2018-07-01 – 2018-07-02 (×2): 40 mg via ORAL
  Filled 2018-07-01 (×2): qty 2

## 2018-07-01 MED ORDER — RIVAROXABAN 15 MG PO TABS
15.0000 mg | ORAL_TABLET | Freq: Every day | ORAL | Status: DC
  Administered 2018-07-01 – 2018-07-02 (×2): 15 mg via ORAL
  Filled 2018-07-01 (×2): qty 1

## 2018-07-01 NOTE — Progress Notes (Signed)
PROGRESS NOTE    KENAE LINDQUIST  VPX:[id number] DOB: 1925-07-23 DOA: 06/25/2018 PCP: Lajean Manes, MD  Brief Narrative: 84 y.o.femalewith medical history significant ofulcerative colitis recently discharged from the hospital on 06/20/2018 on steroids for an ulcerative colitis flare with infectious work-up negative, chronic atrial fibrillation on Xarelto, her Xarelto was held 4 days prior to admission by her PCP, since discharge she been having crampy abdominal pain with mucousy hematochezia, she has not had a bowel movement for the last 8 to 9 days, she relates her abdominal leg continues to get worse she, her daughter relates she is anorexic is lost about 10 pounds in the last 2 weeks, she has an appointment with [facility] tomorrow but her daughter brought her in due to her severe abdominal pain not able to tolerate anything by mouth, she denies any fever, chills, shortness of breath, chest pain, focal weaknesses.  ED Course: In the ED she was found to be normotensive with vitals stable, mild leukopenia with a hemoglobin of 9.7 on discharge on 08/30/2017 it was 10.8, lactic acid 4.8, CT scan of the abdomen and pelvis done on admission showed rectosigmoid colon wall thickening, no signs of ischemic colitis, large stool burden in the ascending and transverse colon   Assessment & Plan:   Active Problems:   Lower GI bleed   Ulcerative colitis with complication (HCC)   Abdominal cramping   Colitis, acute   Colitis   Abdominal infection (HCC)   Constipation   Rectal bleeding   1] diffuse ulcerative colitis of the rectum sigmoid and descending and transverse colon patient-is slightly better today than yesterday.  Her IV got infiltrated yesterday and IV team could not replace an IV.  So she got prednisone yesterday.  She could not get her routine dose of Solu-Medrol.  However she feels that her pain is somewhat better than yesterday had one liquid drainage last night from her rectum no bowel  movements no nausea vomiting.  GI following await recommendations.  Her hepatitis B surface antigen was negative.  QuantiFERON gold was negative.  This was done in anticipation of starting Remicade.  Will defer to GI for further management.  2] chronic atrial fibrillation patient takes Xarelto at home which will be restarted.  This was stopped at the time of admission by her primary care physician due to hematochezia.  She has no active bleeding at this time.  Will restart Xarelto and monitor.  This was discussed with her daughter Mateo Flow on the phone.  3] hypertension stable on Norvasc.     Malnutrition Type:  Nutrition Problem: Inadequate oral intake Etiology: decreased appetite   Malnutrition Characteristics:  Signs/Symptoms: per patient/family report   Nutrition Interventions:  Interventions: Ensure Enlive (each supplement provides 350kcal and 20 grams of protein)  Estimated body mass index is 29.15 kg/m as calculated from the following:   Height as of 06/15/18: 5\' 1"  (1.549 m).   Weight as of 06/19/18: 70 kg.  DVT prophylaxis Xarelto Code Status: DO NOT RESUSCITATE Family Communication: Discussed with daughter Mateo Flow on the phone Disposition Plan:  Pending clinical improvement Consultants: GI  Procedures flex sig Antimicrobials: None  Subjective: She feels her pain is somewhat better than yesterday had one episode of liquid drainage from the rectum overnight no nausea or vomiting  Objective: Vitals:   06/30/18 0633 06/30/18 1501 06/30/18 2135 07/01/18 0633  BP: (!) 156/70 (!) 151/60 115/60 (!) 139/50  Pulse: 65 (!) 35 98 (!) 56  Resp: 18  17 15  Temp: 98.5 F (36.9 C) 98.6 F (37 C) 97.7 F (36.5 C) 98.6 F (37 C)  TempSrc: Oral Oral Oral Oral  SpO2: 94% 93% 95% 91%    Intake/Output Summary (Last 24 hours) at 07/01/2018 0906 Last data filed at 07/01/2018 0600 Gross per 24 hour  Intake 487.65 ml  Output 350 ml  Net 137.65 ml   There were no vitals  filed for this visit.  Examination:  General exam: Appears calm and comfortable  Respiratory system: Clear to auscultation. Respiratory effort normal. Cardiovascular system: S1 & S2 heard, RRR. No JVD, murmurs, rubs, gallops or clicks. No pedal edema. Gastrointestinal system: Abdomen is nondistended, soft and DIFFUSE tender. No organomegaly or masses felt. Normal bowel sounds heard. Central nervous system: Alert and oriented. No focal neurological deficits. Extremities: Symmetric 5 x 5 power. Skin: No rashes, lesions or ulcers Psychiatry: Judgement and insight appear normal. Mood & affect appropriate.     Data Reviewed: I have personally reviewed following labs and imaging studies  CBC: Recent Labs  Lab 06/25/18 1231 06/26/18 0737 06/27/18 0512 06/28/18 0355 06/30/18 0446  WBC 3.8* 5.2 7.3 4.5 4.7  NEUTROABS 3.0  --  6.5  --   --   HGB 9.7* 8.3* 9.2* 8.4* 8.5*  HCT 30.0* 25.4* 29.4* 26.5* 25.2*  MCV 93.5 95.8 95.1 96.0 90.3  PLT 297 227 241 215 706*   Basic Metabolic Panel: Recent Labs  Lab 06/25/18 1231 06/26/18 0502 06/27/18 0512 06/28/18 0355 06/30/18 0446  NA 138 140 137 132* 137  K 3.8 4.4 4.1 3.6 5.0  CL 93* 103 102 98 105  CO2 33* 30 25 26 27   GLUCOSE 218* 85 163* 191* 203*  BUN 12 9 15 18 11   CREATININE 0.79 0.55 0.81 1.01* 0.63  CALCIUM 8.5* 7.2* 7.7* 7.9* 8.0*  MG  --   --   --   --  1.9   GFR: Estimated Creatinine Clearance: 41.8 mL/min (by C-G formula based on SCr of 0.63 mg/dL). Liver Function Tests: Recent Labs  Lab 06/25/18 1231 06/27/18 0512 06/30/18 0446  AST 20 8* 16  ALT 19 14 14   ALKPHOS 70 63 66  BILITOT 0.7 0.5 0.5  PROT 5.4* 4.7* 4.3*  ALBUMIN 2.4* 2.1* 1.9*   No results for input(s): LIPASE, AMYLASE in the last 168 hours. No results for input(s): AMMONIA in the last 168 hours. Coagulation Profile: No results for input(s): INR, PROTIME in the last 168 hours. Cardiac Enzymes: No results for input(s): CKTOTAL, CKMB, CKMBINDEX,  TROPONINI in the last 168 hours. BNP (last 3 results) No results for input(s): PROBNP in the last 8760 hours. HbA1C: No results for input(s): HGBA1C in the last 72 hours. CBG: No results for input(s): GLUCAP in the last 168 hours. Lipid Profile: No results for input(s): CHOL, HDL, LDLCALC, TRIG, CHOLHDL, LDLDIRECT in the last 72 hours. Thyroid Function Tests: No results for input(s): TSH, T4TOTAL, FREET4, T3FREE, THYROIDAB in the last 72 hours. Anemia Panel: No results for input(s): VITAMINB12, FOLATE, FERRITIN, TIBC, IRON, RETICCTPCT in the last 72 hours. Sepsis Labs: Recent Labs  Lab 06/25/18 1302 06/25/18 1708  LATICACIDVEN 4.85* 3.88*    Recent Results (from the past 240 hour(s))  Blood Culture (routine x 2)     Status: None   Collection Time: 06/25/18  4:57 PM  Result Value Ref Range Status   Specimen Description   Final    BLOOD LEFT ANTECUBITAL Performed at [facility] [place],  [place] [address]    Special Requests   Final    BOTTLES DRAWN AEROBIC AND ANAEROBIC Blood Culture adequate volume Performed at [facility] [address] [place], [place] [address]    Culture   Final    NO GROWTH 5 DAYS Performed at [facility], [place] [address] [place], [place] [address]    Report Status 06/30/2018 FINAL  Final  Urine culture     Status: Abnormal   Collection Time: 06/25/18  4:58 PM  Result Value Ref Range Status   Specimen Description   Final    URINE, RANDOM Performed at [facility] [address], [place], [place] [address]    Special Requests   Final    NONE Performed at [facility], [place] [address] [place], [place] [address]    Culture 60,000 COLONIES/mL ESCHERICHIA COLI (A)  Final   Report Status 06/28/2018 FINAL  Final   Organism ID, Bacteria ESCHERICHIA COLI (A)  Final      Susceptibility   Escherichia coli - MIC*    AMPICILLIN >=32 RESISTANT Resistant      CEFAZOLIN <=4 SENSITIVE Sensitive     CEFTRIAXONE <=1 SENSITIVE Sensitive     CIPROFLOXACIN <=0.25 SENSITIVE Sensitive     GENTAMICIN >=16 RESISTANT Resistant     IMIPENEM <=0.25 SENSITIVE Sensitive     NITROFURANTOIN <=16 SENSITIVE Sensitive     TRIMETH/SULFA >=320 RESISTANT Resistant     AMPICILLIN/SULBACTAM >=32 RESISTANT Resistant     PIP/TAZO <=4 SENSITIVE Sensitive     Extended ESBL NEGATIVE Sensitive     * 60,000 COLONIES/mL ESCHERICHIA COLI  Blood Culture (routine x 2)     Status: None   Collection Time: 06/25/18  7:45 PM  Result Value Ref Range Status   Specimen Description   Final    BLOOD LEFT HAND Performed at [facility] [address] [place], [place] [address]    Special Requests   Final    BOTTLES DRAWN AEROBIC ONLY Blood Culture adequate volume Performed at [facility] [address], [place], [place] [address]    Culture   Final    NO GROWTH 5 DAYS Performed at [facility],  [address] [place],  [address]    Report Status 06/30/2018 FINAL  Final         Radiology Studies: No results found.      Scheduled Meds: . cholecalciferol  1,000 Units Oral Daily  . feeding supplement (ENSURE ENLIVE)  237 mL Oral BID BM  . mesalamine  4.8 g Oral Q breakfast  . mesalamine  4 g Rectal Daily  . predniSONE  40 mg Oral Daily  . psyllium  1 packet Oral Daily  . saccharomyces boulardii  250 mg Oral BID   Continuous Infusions: . sodium chloride 50 mL/hr at 06/30/18 1100  . sodium chloride       LOS: 6 days     Georgette Shell, MD [facility]  If 7PM-7AM, please contact night-coverage [email or web address] Password [email or web address] 07/01/2018, 9:06 AM

## 2018-07-01 NOTE — Progress Notes (Signed)
Felicia Carlson 11:24 AM  Subjective: Patient doing well with just a little bit of pain and much less diarrhea and no signs of bleeding no new complaints  Objective: Vital signs stable afebrile no acute distress abdomen is soft essentially nontender occasional bowel sounds no guarding or rebound no new labs today  Assessment: Colitis probable ulcerative colitis  Plan: Agree with changing to oral prednisone I offered to advance her diet but she wants to hold off for now and possibly her pain has been worse with fiber supplements so I will hold that for now and we are awaiting the CMV stains on biopsy before using Remicade however might want to make that decision as an outpatient since diarrhea is better signs of bleeding and follow-up with Dr. Therisa Doyne who probably knows her best  [facility]  Pager [phone] After 5PM or if no answer call [phone]

## 2018-07-02 ENCOUNTER — Inpatient Hospital Stay (HOSPITAL_COMMUNITY): Payer: Medicare HMO

## 2018-07-02 LAB — COMPREHENSIVE METABOLIC PANEL
ALBUMIN: 2 g/dL — AB (ref 3.5–5.0)
ALK PHOS: 84 U/L (ref 38–126)
ALT: 12 U/L (ref 0–44)
AST: 14 U/L — ABNORMAL LOW (ref 15–41)
Anion gap: 11 (ref 5–15)
BILIRUBIN TOTAL: 0.7 mg/dL (ref 0.3–1.2)
BUN: 20 mg/dL (ref 8–23)
CALCIUM: 8.6 mg/dL — AB (ref 8.9–10.3)
CO2: 24 mmol/L (ref 22–32)
Chloride: 100 mmol/L (ref 98–111)
Creatinine, Ser: 0.84 mg/dL (ref 0.44–1.00)
GFR calc Af Amer: >60 mL/min (ref >60)
GFR, EST NON AFRICAN AMERICAN: 59 mL/min — AB (ref >60)
Glucose, Bld: 245 mg/dL — ABNORMAL HIGH (ref 70–99)
POTASSIUM: 4.7 mmol/L (ref 3.5–5.1)
Sodium: 135 mmol/L (ref 135–145)
TOTAL PROTEIN: 4.6 g/dL — AB (ref 6.5–8.1)

## 2018-07-02 LAB — HEPATITIS B SURFACE ANTIGEN: HEP B S AG: NEGATIVE

## 2018-07-02 LAB — CBC
HEMATOCRIT: 28.6 % — AB (ref 36.0–46.0)
HEMOGLOBIN: 8.9 g/dL — AB (ref 12.0–15.0)
MCH: 30 pg (ref 26.0–34.0)
MCHC: 31.1 g/dL (ref 30.0–36.0)
MCV: 96.3 fL (ref 80.0–100.0)
Platelets: 158 10*3/uL (ref 150–400)
RBC: 2.97 MIL/uL — ABNORMAL LOW (ref 3.87–5.11)
RDW: 15.4 % (ref 11.5–15.5)
WBC: 6.5 10*3/uL (ref 4.0–10.5)
nRBC: 0 % (ref 0.0–0.2)

## 2018-07-02 LAB — QUANTIFERON-TB GOLD PLUS (RQFGPL)
QUANTIFERON TB1 AG VALUE: 0.03 [IU]/mL
QUANTIFERON TB2 AG VALUE: 0.03 [IU]/mL
QuantiFERON Mitogen Value: 0.08 IU/mL
QuantiFERON Nil Value: 0.03 IU/mL

## 2018-07-02 LAB — QUANTIFERON-TB GOLD PLUS: QuantiFERON-TB Gold Plus: UNDETERMINED

## 2018-07-02 NOTE — Progress Notes (Signed)
Radiologist called with CT results. Mid-level MD K. Schorr contacted with results.

## 2018-07-02 NOTE — Progress Notes (Signed)
Physical Therapy Treatment Patient Details Name: Felicia Carlson MRN: [id number] DOB: [birth year] Today's Date: 07/02/2018    History of Present Illness 84 yo female admitted with ulcerative colitis; PMHx: chest pain, abnormal ECG. hx of breast ca, UC, CKD    PT Comments    Pt still feels "bad" with poor po intake and decrease mobility.  Assisted OOB to amb to bathroom required much assist and increased time.  Assisted in bathroom with hygiene as pt was unable to self perform and maintain a safe stance.  Max c/o B LE weakness with tendency to push walker too far to front.  Assisted back to bed and reported to RN pt had a bloody, clotty loose, watery stool in toilet.   Follow Up Recommendations  Home health PT;Supervision/Assistance - 24 hour     Equipment Recommendations  None recommended by PT    Recommendations for Other Services       Precautions / Restrictions Precautions Precautions: Fall Precaution Comments: still present with bloody clot loose stools  Restrictions Weight Bearing Restrictions: No    Mobility  Bed Mobility Overal bed mobility: Needs Assistance Bed Mobility: Supine to Sit;Sit to Supine     Supine to sit: Supervision;HOB elevated Sit to supine: Min assist   General bed mobility comments: assist B LE back onto bed due to fatigue  Transfers Overall transfer level: Needs assistance Equipment used: Rolling walker (2 wheeled);None Transfers: Sit to/from Stand Sit to Stand: Min guard;Min assist Stand pivot transfers: Min guard;Min assist       General transfer comment: required increased assist due to increased weakness/fatigue and overall "not feeling well".  Ambulation/Gait Ambulation/Gait assistance: Min assist Gait Distance (Feet): 12 Feet(6 feet x 2 to and from bathroom) Assistive device: Rolling walker (2 wheeled) Gait Pattern/deviations: Step-through pattern;Trunk flexed Gait velocity: decreased    General Gait Details: tendenacy to push  walker too far to front, limited distance due to weakness/fatigue  Unable to tolerate further activity   Stairs             Wheelchair Mobility    Modified Rankin (Stroke Patients Only)       Balance                                            Cognition Arousal/Alertness: Awake/alert Behavior During Therapy: WFL for tasks assessed/performed Overall Cognitive Status: Within Functional Limits for tasks assessed                                 General Comments: pleasant and tries but very weak      Exercises      General Comments        Pertinent Vitals/Pain Pain Assessment: Faces Faces Pain Scale: Hurts little more Pain Location: ABd Pain Descriptors / Indicators: Grimacing;Cramping Pain Intervention(s): Monitored during session;Repositioned;Heat applied(K Pad in room)    Home Living                      Prior Function            PT Goals (current goals can now be found in the care plan section) Progress towards PT goals: Progressing toward goals    Frequency           PT Plan Current plan remains appropriate  Co-evaluation              AM-PAC PT "6 Clicks" Daily Activity  Outcome Measure  Difficulty turning over in bed (including adjusting bedclothes, sheets and blankets)?: A Lot Difficulty moving from lying on back to sitting on the side of the bed? : A Lot Difficulty sitting down on and standing up from a chair with arms (e.g., wheelchair, bedside commode, etc,.)?: A Lot Help needed moving to and from a bed to chair (including a wheelchair)?: A Lot Help needed walking in hospital room?: A Lot Help needed climbing 3-5 steps with a railing? : A Lot 6 Click Score: 12    End of Session Equipment Utilized During Treatment: Gait belt Activity Tolerance: Patient limited by fatigue;Patient limited by pain Patient left: in bed;with call bell/phone within reach Nurse Communication: (pt had bloody,  clotty, stools) PT Visit Diagnosis: Muscle weakness (generalized) (M62.81);Difficulty in walking, not elsewhere classified (R26.2)     Time: 1350-1415 PT Time Calculation (min) (ACUTE ONLY): 25 min  Charges:  $Gait Training: 8-22 mins $Therapeutic Activity: 8-22 mins                     Rica Koyanagi  PTA Acute  Rehabilitation Services Pager      [phone] Office      [phone]

## 2018-07-02 NOTE — Progress Notes (Addendum)
PROGRESS NOTE    Felicia Carlson  XIP:[id number] DOB: 1925/06/05 DOA: 06/25/2018   PCP: Lajean Manes, MD   Brief Narrative: 84 y.o.femalewith medical history significant ofulcerative colitis, recently discharged from the hospital on 06/20/2018 on steroids for an ulcerative colitis flare with infectious work-up negative, chronic atrial fibrillation on Xarelto, her Xarelto was held 4 days prior to admission by her PCP. Since discharge, patient was having crampy abdominal pain with mucousy hematochezia, reported anorexia and about 10 pounds in the last 2 weeks.she had an appointment with [facility] but her daughter brought her in due to her severe abdominal pain not able to tolerate anything by mouth.  ED Course: In the ED she was found to be normotensive with vitals stable, mild leukopenia with a hemoglobin of 9.7 on discharge on 08/30/2017 it was 10.8, lactic acid 4.8, CT scan of the abdomen and pelvis done on admission showed rectosigmoid colon wall thickening, no signs of ischemic colitis, large stool burden in the ascending and transverse colon.  Patient was then admitted to the hospital and following conditions were addressed during hospitalization.  Assessment & Plan:   Active Problems:   Lower GI bleed   Ulcerative colitis with complication (HCC)   Abdominal cramping   Colitis, acute   Colitis   Abdominal infection (HCC)   Constipation   Rectal bleeding  Lower GI bleed secondary to diffuse ulcerative colitis of the rectum, sigmoid and descending/transverse colon.  Patient had one bloody bowel movement yesterday.  On prednisone and mesalamine.  GI on board. Follow GI recommendation.  GI is awaiting biopsy for CMV prior to considering Remicade.  CRP elevated at 19.8.  On full liquid diet.  QuantiFERON TB Gold plus was negative.  Hepatitis B surface antigen was negative.  Blood cultures have been negative so far.  Surgical pathology still pending  Chronic atrial fibrillation.  Had one  bloody bowel movement yesterday.  Xarelto had been reinitiated.  Hemoglobin has not dropped.  We will continue to monitor.  If she continues to have more bloody bowel movements and hemoglobin drops, will consider discontinuation of Xarelto.  Hypertension stable on Norvasc.  Monitor blood pressure.  Debility, deconditioning.  Patient has been seen by physical therapy.  Recommend home health with 24-hour supervision.  DVT prophylaxis   Xarelto  Code Status: DO NOT RESUSCITATE  Family Communication:   Spoke with the patient's daughter on the phone and updated her about the clinical condition of the patient  Disposition Plan:  Pending clinical improvement, GI input.  Likely home with home health in 2 to 3 days.  Consultants: GI  Procedures:  flex sigmoidoscopy on 06/26/2018  Antimicrobials: None  Subjective:   Per the nursing staff, patient had bloody bowel movement yesterday.  Complains of mild abdominal cramps.  No nausea.  She does have generalized weakness.  Still on liquids.  No chest pain or shortness of breath.  Objective: Vitals:   07/01/18 1420 07/01/18 2149 07/01/18 2151 07/02/18 0437  BP: 131/65 (!) 183/87 (!) 157/96 (!) 145/87  Pulse:  (!) 41 98 81  Resp:  14  16  Temp:  98.8 F (37.1 C)  97.8 F (36.6 C)  TempSrc:  Oral  Oral  SpO2:  91%  95%    Intake/Output Summary (Last 24 hours) at 07/02/2018 1044 Last data filed at 07/02/2018 1009 Gross per 24 hour  Intake 540 ml  Output 0 ml  Net 540 ml   There were no vitals filed for this visit.  Physical Examination:  General exam: Appears calm and comfortable ,Not in distress, thinly built. HEENT:PERRL,Oral mucosa moist Respiratory system: Bilateral equal air entry, normal vesicular breath sounds, no wheezes or crackles  Cardiovascular system: S1 & S2 heard, RRR.  Gastrointestinal system: Abdomen is nondistended, but nonspecific tenderness noted.  No organomegaly or masses felt. Normal bowel sounds  heard. Central nervous system: Alert and oriented. No focal neurological deficits. Extremities: No edema, no clubbing ,no cyanosis, distal peripheral pulses palpable. Skin: No rashes, lesions or ulcers,no icterus ,no pallor MSK: Normal muscle bulk,tone ,power   Data Reviewed: I have personally reviewed following labs and imaging studies  CBC: Recent Labs  Lab 06/25/18 1231 06/26/18 0737 06/27/18 0512 06/28/18 0355 06/30/18 0446 07/02/18 0348  WBC 3.8* 5.2 7.3 4.5 4.7 6.5  NEUTROABS 3.0  --  6.5  --   --   --   HGB 9.7* 8.3* 9.2* 8.4* 8.5* 8.9*  HCT 30.0* 25.4* 29.4* 26.5* 25.2* 28.6*  MCV 93.5 95.8 95.1 96.0 90.3 96.3  PLT 297 227 241 215 146* 539   Basic Metabolic Panel: Recent Labs  Lab 06/26/18 0502 06/27/18 0512 06/28/18 0355 06/30/18 0446 07/02/18 0348  NA 140 137 132* 137 135  K 4.4 4.1 3.6 5.0 4.7  CL 103 102 98 105 100  CO2 30 25 26 27 24   GLUCOSE 85 163* 191* 203* 245*  BUN 9 15 18 11 20   CREATININE 0.55 0.81 1.01* 0.63 0.84  CALCIUM 7.2* 7.7* 7.9* 8.0* 8.6*  MG  --   --   --  1.9  --    GFR: Estimated Creatinine Clearance: 39.8 mL/min (by C-G formula based on SCr of 0.84 mg/dL). Liver Function Tests: Recent Labs  Lab 06/25/18 1231 06/27/18 0512 06/30/18 0446 07/02/18 0348  AST 20 8* 16 14*  ALT 19 14 14 12   ALKPHOS 70 63 66 84  BILITOT 0.7 0.5 0.5 0.7  PROT 5.4* 4.7* 4.3* 4.6*  ALBUMIN 2.4* 2.1* 1.9* 2.0*   No results for input(s): LIPASE, AMYLASE in the last 168 hours. No results for input(s): AMMONIA in the last 168 hours. Coagulation Profile: No results for input(s): INR, PROTIME in the last 168 hours. Cardiac Enzymes: No results for input(s): CKTOTAL, CKMB, CKMBINDEX, TROPONINI in the last 168 hours. BNP (last 3 results) No results for input(s): PROBNP in the last 8760 hours. HbA1C: No results for input(s): HGBA1C in the last 72 hours. CBG: No results for input(s): GLUCAP in the last 168 hours. Lipid Profile: No results for  input(s): CHOL, HDL, LDLCALC, TRIG, CHOLHDL, LDLDIRECT in the last 72 hours. Thyroid Function Tests: No results for input(s): TSH, T4TOTAL, FREET4, T3FREE, THYROIDAB in the last 72 hours. Anemia Panel: No results for input(s): VITAMINB12, FOLATE, FERRITIN, TIBC, IRON, RETICCTPCT in the last 72 hours. Sepsis Labs: Recent Labs  Lab 06/25/18 1302 06/25/18 1708  LATICACIDVEN 4.85* 3.88*    Recent Results (from the past 240 hour(s))  Blood Culture (routine x 2)     Status: None   Collection Time: 06/25/18  4:57 PM  Result Value Ref Range Status   Specimen Description   Final    BLOOD LEFT ANTECUBITAL Performed at [facility] [address], [place], [place] [address]    Special Requests   Final    BOTTLES DRAWN AEROBIC AND ANAEROBIC Blood Culture adequate volume Performed at [facility] [address] [place], [place] [address]    Culture   Final    NO GROWTH  5 DAYS Performed at [facility], [place] [address], [place], [place] [address]    Report Status 06/30/2018 FINAL  Final  Urine culture     Status: Abnormal   Collection Time: 06/25/18  4:58 PM  Result Value Ref Range Status   Specimen Description   Final    URINE, RANDOM Performed at [facility] [address], [place], [place] [address]    Special Requests   Final    NONE Performed at [facility], [place] [address], [place], [place] [address]    Culture 60,000 COLONIES/mL ESCHERICHIA COLI (A)  Final   Report Status 06/28/2018 FINAL  Final   Organism ID, Bacteria ESCHERICHIA COLI (A)  Final      Susceptibility   Escherichia coli - MIC*    AMPICILLIN >=32 RESISTANT Resistant     CEFAZOLIN <=4 SENSITIVE Sensitive     CEFTRIAXONE <=1 SENSITIVE Sensitive     CIPROFLOXACIN <=0.25 SENSITIVE Sensitive     GENTAMICIN >=16 RESISTANT Resistant     IMIPENEM <=0.25 SENSITIVE Sensitive     NITROFURANTOIN <=16 SENSITIVE Sensitive      TRIMETH/SULFA >=320 RESISTANT Resistant     AMPICILLIN/SULBACTAM >=32 RESISTANT Resistant     PIP/TAZO <=4 SENSITIVE Sensitive     Extended ESBL NEGATIVE Sensitive     * 60,000 COLONIES/mL ESCHERICHIA COLI  Blood Culture (routine x 2)     Status: None   Collection Time: 06/25/18  7:45 PM  Result Value Ref Range Status   Specimen Description   Final    BLOOD LEFT HAND Performed at [facility] [address] [place], [place] [address]    Special Requests   Final    BOTTLES DRAWN AEROBIC ONLY Blood Culture adequate volume Performed at [facility] [address], [place], [place] [address]    Culture   Final    NO GROWTH 5 DAYS Performed at [facility], [place] [address] [place], [place] [address]    Report Status 06/30/2018 FINAL  Final     Radiology Studies: No results found.   Scheduled Meds: . cholecalciferol  1,000 Units Oral Daily  . feeding supplement (ENSURE ENLIVE)  237 mL Oral BID BM  . mesalamine  4.8 g Oral Q breakfast  . mesalamine  4 g Rectal Daily  . predniSONE  40 mg Oral Daily  . Rivaroxaban  15 mg Oral Q supper  . saccharomyces boulardii  250 mg Oral BID   Continuous Infusions: . sodium chloride 50 mL/hr at 06/30/18 1100  . sodium chloride       LOS: 7 days    Flora Lipps, MD [facility]  If 7PM-7AM, please contact night-coverage [email or web address] Password [facility] 07/02/2018, 10:44 AM

## 2018-07-02 NOTE — Progress Notes (Signed)
[name] 5:39 PM  Subjective: Patient with worst pain today but no nausea or vomiting and only minimal diarrhea and no new complaints  Objective: Vital signs stable afebrile abdomen is soft no guarding or rebound nontender to exam labs stable previous CTs reviewed biopsy with no signs of CMV  Assessment: Increased abdominal pain questionable etiology  Plan: We will proceed with repeat CT to compare to see if she needs higher dose steroids or if we should proceed with Remicade or if other etiologies and consider changing pain medicine which may be playing a role in her abdominal pain and possibly tramadol may be a better option or a fentanyl patch  Michel Hendon E  Pager [phone] After 5PM or if no answer call [phone]

## 2018-07-03 ENCOUNTER — Inpatient Hospital Stay (HOSPITAL_COMMUNITY): Payer: Medicare HMO

## 2018-07-03 ENCOUNTER — Encounter (HOSPITAL_COMMUNITY)
Admission: EM | Disposition: A | Discharge status: Hospice | Payer: Self-pay | Source: Home / Self Care | Attending: Internal Medicine

## 2018-07-03 ENCOUNTER — Inpatient Hospital Stay (HOSPITAL_COMMUNITY): Payer: Medicare HMO | Admitting: Certified Registered Nurse Anesthetist

## 2018-07-03 ENCOUNTER — Inpatient Hospital Stay: Payer: Self-pay

## 2018-07-03 DIAGNOSIS — R739 Hyperglycemia, unspecified: Secondary | ICD-10-CM

## 2018-07-03 DIAGNOSIS — Z7901 Long term (current) use of anticoagulants: Secondary | ICD-10-CM

## 2018-07-03 DIAGNOSIS — K529 Noninfective gastroenteritis and colitis, unspecified: Secondary | ICD-10-CM

## 2018-07-03 DIAGNOSIS — K659 Peritonitis, unspecified: Secondary | ICD-10-CM

## 2018-07-03 DIAGNOSIS — K51919 Ulcerative colitis, unspecified with unspecified complications: Secondary | ICD-10-CM

## 2018-07-03 DIAGNOSIS — K625 Hemorrhage of anus and rectum: Secondary | ICD-10-CM

## 2018-07-03 DIAGNOSIS — I482 Chronic atrial fibrillation, unspecified: Secondary | ICD-10-CM

## 2018-07-03 DIAGNOSIS — K668 Other specified disorders of peritoneum: Secondary | ICD-10-CM

## 2018-07-03 DIAGNOSIS — Z978 Presence of other specified devices: Secondary | ICD-10-CM

## 2018-07-03 HISTORY — PX: COLECTOMY WITH COLOSTOMY CREATION/HARTMANN PROCEDURE: SHX6598

## 2018-07-03 LAB — CBC WITH DIFFERENTIAL/PLATELET
Abs Immature Granulocytes: 0.04 10*3/uL (ref 0.00–0.07)
BASOS ABS: 0 10*3/uL (ref 0.0–0.1)
BASOS PCT: 0 %
EOS PCT: 0 %
Eosinophils Absolute: 0 10*3/uL (ref 0.0–0.5)
HCT: 28.6 % — ABNORMAL LOW (ref 36.0–46.0)
HEMOGLOBIN: 8.9 g/dL — AB (ref 12.0–15.0)
Immature Granulocytes: 1 %
LYMPHS PCT: 9 %
Lymphs Abs: 0.5 10*3/uL — ABNORMAL LOW (ref 0.7–4.0)
MCH: 30 pg (ref 26.0–34.0)
MCHC: 31.1 g/dL (ref 30.0–36.0)
MCV: 96.3 fL (ref 80.0–100.0)
MONO ABS: 0.4 10*3/uL (ref 0.1–1.0)
Monocytes Relative: 8 %
NEUTROS ABS: 4.5 10*3/uL (ref 1.7–7.7)
NRBC: 0 % (ref 0.0–0.2)
Neutrophils Relative %: 82 %
Platelets: 154 10*3/uL (ref 150–400)
RBC: 2.97 MIL/uL — ABNORMAL LOW (ref 3.87–5.11)
RDW: 15.2 % (ref 11.5–15.5)
WBC: 5.5 10*3/uL (ref 4.0–10.5)

## 2018-07-03 LAB — BLOOD GAS, ARTERIAL
ACID-BASE EXCESS: 4.2 mmol/L — AB (ref 0.0–2.0)
BICARBONATE: 28 mmol/L (ref 20.0–28.0)
DRAWN BY: 225631
FIO2: 100
O2 SAT: 98.9 %
PEEP/CPAP: 5 cmH2O
PH ART: 7.45 (ref 7.350–7.450)
Patient temperature: 98.6
RATE: 16 resp/min
VT: 0.38 mL
pCO2 arterial: 41 mmHg (ref 32.0–48.0)
pO2, Arterial: 315 mmHg — ABNORMAL HIGH (ref 83.0–108.0)

## 2018-07-03 LAB — COMPREHENSIVE METABOLIC PANEL
ALBUMIN: 2.1 g/dL — AB (ref 3.5–5.0)
ALK PHOS: 88 U/L (ref 38–126)
ALT: 13 U/L (ref 0–44)
ANION GAP: 9 (ref 5–15)
AST: 16 U/L (ref 15–41)
BUN: 21 mg/dL (ref 8–23)
CALCIUM: 8.7 mg/dL — AB (ref 8.9–10.3)
CHLORIDE: 96 mmol/L — AB (ref 98–111)
CO2: 27 mmol/L (ref 22–32)
Creatinine, Ser: 0.73 mg/dL (ref 0.44–1.00)
GFR calc non Af Amer: >60 mL/min (ref >60)
GLUCOSE: 227 mg/dL — AB (ref 70–99)
POTASSIUM: 4.7 mmol/L (ref 3.5–5.1)
SODIUM: 132 mmol/L — AB (ref 135–145)
TOTAL PROTEIN: 5 g/dL — AB (ref 6.5–8.1)
Total Bilirubin: 0.3 mg/dL (ref 0.3–1.2)

## 2018-07-03 LAB — MRSA PCR SCREENING: MRSA BY PCR: NEGATIVE

## 2018-07-03 LAB — PREPARE RBC (CROSSMATCH)

## 2018-07-03 LAB — TRIGLYCERIDES: TRIGLYCERIDES: 91 mg/dL (ref <150)

## 2018-07-03 SURGERY — COLECTOMY, WITH COLOSTOMY CREATION
Anesthesia: General

## 2018-07-03 MED ORDER — ONDANSETRON HCL 4 MG/2ML IJ SOLN
INTRAMUSCULAR | Status: AC
  Filled 2018-07-03: qty 2

## 2018-07-03 MED ORDER — PROPOFOL 10 MG/ML IV BOLUS
INTRAVENOUS | Status: AC
  Filled 2018-07-03: qty 20

## 2018-07-03 MED ORDER — LIDOCAINE 2% (20 MG/ML) 5 ML SYRINGE
INTRAMUSCULAR | Status: DC | PRN
  Administered 2018-07-03: 50 mg via INTRAVENOUS

## 2018-07-03 MED ORDER — ACETAMINOPHEN 10 MG/ML IV SOLN
1000.0000 mg | Freq: Four times a day (QID) | INTRAVENOUS | Status: AC
  Administered 2018-07-03 – 2018-07-04 (×4): 1000 mg via INTRAVENOUS
  Filled 2018-07-03 (×4): qty 100

## 2018-07-03 MED ORDER — SODIUM CHLORIDE 0.9% IV SOLUTION: Freq: Once | INTRAVENOUS | Status: DC

## 2018-07-03 MED ORDER — ALBUTEROL SULFATE (2.5 MG/3ML) 0.083% IN NEBU: 2.5000 mg | INHALATION_SOLUTION | RESPIRATORY_TRACT | Status: DC | PRN

## 2018-07-03 MED ORDER — LABETALOL HCL 5 MG/ML IV SOLN
10.0000 mg | INTRAVENOUS | Status: DC | PRN
  Administered 2018-07-04 – 2018-07-15 (×11): 10 mg via INTRAVENOUS
  Filled 2018-07-03 (×11): qty 4

## 2018-07-03 MED ORDER — PHENYLEPHRINE 40 MCG/ML (10ML) SYRINGE FOR IV PUSH (FOR BLOOD PRESSURE SUPPORT)
PREFILLED_SYRINGE | INTRAVENOUS | Status: DC | PRN
  Administered 2018-07-03 (×4): 80 ug via INTRAVENOUS

## 2018-07-03 MED ORDER — ONDANSETRON HCL 4 MG/2ML IJ SOLN
INTRAMUSCULAR | Status: DC | PRN
  Administered 2018-07-03: 4 mg via INTRAVENOUS

## 2018-07-03 MED ORDER — MORPHINE SULFATE (PF) 2 MG/ML IV SOLN
2.0000 mg | INTRAVENOUS | Status: DC | PRN
  Administered 2018-07-03 (×3): 2 mg via INTRAVENOUS
  Filled 2018-07-03 (×3): qty 1

## 2018-07-03 MED ORDER — SODIUM CHLORIDE 0.9% FLUSH: 10.0000 mL | INTRAVENOUS | Status: DC | PRN

## 2018-07-03 MED ORDER — ORAL CARE MOUTH RINSE
15.0000 mL | OROMUCOSAL | Status: DC
  Administered 2018-07-03 – 2018-07-04 (×7): 15 mL via OROMUCOSAL

## 2018-07-03 MED ORDER — PROPOFOL 10 MG/ML IV BOLUS
INTRAVENOUS | Status: DC | PRN
  Administered 2018-07-03: 100 mg via INTRAVENOUS

## 2018-07-03 MED ORDER — PANTOPRAZOLE SODIUM 40 MG IV SOLR
40.0000 mg | Freq: Every day | INTRAVENOUS | Status: DC
  Filled 2018-07-03: qty 40

## 2018-07-03 MED ORDER — LACTATED RINGERS IV SOLN
INTRAVENOUS | Status: DC
  Administered 2018-07-03: 15:00:00 via INTRAVENOUS

## 2018-07-03 MED ORDER — PIPERACILLIN-TAZOBACTAM 3.375 G IVPB
3.3750 g | Freq: Three times a day (TID) | INTRAVENOUS | Status: DC
  Administered 2018-07-04 – 2018-07-15 (×34): 3.375 g via INTRAVENOUS
  Filled 2018-07-03 (×34): qty 50

## 2018-07-03 MED ORDER — FENTANYL CITRATE (PF) 100 MCG/2ML IJ SOLN
INTRAMUSCULAR | Status: AC
  Filled 2018-07-03: qty 4

## 2018-07-03 MED ORDER — METOPROLOL TARTRATE 5 MG/5ML IV SOLN: 5.0000 mg | Freq: Four times a day (QID) | INTRAVENOUS | Status: DC

## 2018-07-03 MED ORDER — SODIUM CHLORIDE 0.9 % IV SOLN
INTRAVENOUS | Status: DC
  Administered 2018-07-03 – 2018-07-04 (×2): via INTRAVENOUS

## 2018-07-03 MED ORDER — METHYLPREDNISOLONE SODIUM SUCC 40 MG IJ SOLR
40.0000 mg | Freq: Every day | INTRAMUSCULAR | Status: DC
  Administered 2018-07-03: 40 mg via INTRAVENOUS
  Filled 2018-07-03: qty 1

## 2018-07-03 MED ORDER — FENTANYL 2500MCG IN NS 250ML (10MCG/ML) PREMIX INFUSION
0.0000 ug/h | INTRAVENOUS | Status: DC
  Administered 2018-07-03: 25 ug/h via INTRAVENOUS
  Filled 2018-07-03 (×2): qty 250

## 2018-07-03 MED ORDER — PANTOPRAZOLE SODIUM 40 MG IV SOLR
40.0000 mg | Freq: Every day | INTRAVENOUS | Status: DC
  Administered 2018-07-03 – 2018-07-11 (×9): 40 mg via INTRAVENOUS
  Filled 2018-07-03 (×8): qty 40

## 2018-07-03 MED ORDER — FENTANYL CITRATE (PF) 100 MCG/2ML IJ SOLN
50.0000 ug | INTRAMUSCULAR | Status: DC | PRN
  Administered 2018-07-04 (×2): 50 ug via INTRAVENOUS
  Filled 2018-07-03: qty 2

## 2018-07-03 MED ORDER — LIDOCAINE 2% (20 MG/ML) 5 ML SYRINGE
INTRAMUSCULAR | Status: AC
  Filled 2018-07-03: qty 5

## 2018-07-03 MED ORDER — FENTANYL CITRATE (PF) 100 MCG/2ML IJ SOLN
50.0000 ug | INTRAMUSCULAR | Status: DC | PRN
  Administered 2018-07-04 – 2018-07-05 (×2): 50 ug via INTRAVENOUS
  Filled 2018-07-03: qty 2

## 2018-07-03 MED ORDER — FENTANYL CITRATE (PF) 250 MCG/5ML IJ SOLN
INTRAMUSCULAR | Status: AC
  Filled 2018-07-03: qty 5

## 2018-07-03 MED ORDER — PIPERACILLIN-TAZOBACTAM 3.375 G IVPB 30 MIN
3.3750 g | Freq: Once | INTRAVENOUS | Status: AC
  Administered 2018-07-03: 3.375 g via INTRAVENOUS
  Filled 2018-07-03 (×2): qty 50

## 2018-07-03 MED ORDER — PIPERACILLIN-TAZOBACTAM 3.375 G IVPB
3.3750 g | Freq: Three times a day (TID) | INTRAVENOUS | Status: DC
  Administered 2018-07-03 (×2): 3.375 g via INTRAVENOUS
  Filled 2018-07-03 (×2): qty 50

## 2018-07-03 MED ORDER — DEXAMETHASONE SODIUM PHOSPHATE 10 MG/ML IJ SOLN
INTRAMUSCULAR | Status: DC | PRN
  Administered 2018-07-03: 10 mg via INTRAVENOUS

## 2018-07-03 MED ORDER — ROCURONIUM BROMIDE 50 MG/5ML IV SOSY
PREFILLED_SYRINGE | INTRAVENOUS | Status: DC | PRN
  Administered 2018-07-03 (×3): 20 mg via INTRAVENOUS
  Administered 2018-07-03: 30 mg via INTRAVENOUS

## 2018-07-03 MED ORDER — DEXAMETHASONE SODIUM PHOSPHATE 10 MG/ML IJ SOLN
INTRAMUSCULAR | Status: AC
  Filled 2018-07-03: qty 1

## 2018-07-03 MED ORDER — ALBUMIN HUMAN 5 % IV SOLN
INTRAVENOUS | Status: DC | PRN
  Administered 2018-07-03: 18:00:00 via INTRAVENOUS

## 2018-07-03 MED ORDER — ONDANSETRON 4 MG PO TBDP: 4.0000 mg | ORAL_TABLET | Freq: Four times a day (QID) | ORAL | Status: DC | PRN

## 2018-07-03 MED ORDER — CHLORHEXIDINE GLUCONATE 0.12% ORAL RINSE (MEDLINE KIT)
15.0000 mL | Freq: Two times a day (BID) | OROMUCOSAL | Status: DC
  Administered 2018-07-03 – 2018-07-09 (×12): 15 mL via OROMUCOSAL

## 2018-07-03 MED ORDER — PROPOFOL 1000 MG/100ML IV EMUL
0.0000 ug/kg/min | INTRAVENOUS | Status: DC
  Administered 2018-07-03: 5 ug/kg/min via INTRAVENOUS
  Administered 2018-07-04: 35 ug/kg/min via INTRAVENOUS
  Filled 2018-07-03 (×2): qty 100

## 2018-07-03 MED ORDER — ENOXAPARIN SODIUM 40 MG/0.4ML ~~LOC~~ SOLN
40.0000 mg | SUBCUTANEOUS | Status: DC
  Administered 2018-07-04 – 2018-07-13 (×10): 40 mg via SUBCUTANEOUS
  Filled 2018-07-03 (×10): qty 0.4

## 2018-07-03 MED ORDER — PROPOFOL 1000 MG/100ML IV EMUL
5.0000 ug/kg/min | INTRAVENOUS | Status: DC
  Administered 2018-07-03: 5 ug/kg/min via INTRAVENOUS
  Filled 2018-07-03: qty 100

## 2018-07-03 MED ORDER — SUCCINYLCHOLINE CHLORIDE 200 MG/10ML IV SOSY
PREFILLED_SYRINGE | INTRAVENOUS | Status: DC | PRN
  Administered 2018-07-03: 100 mg via INTRAVENOUS

## 2018-07-03 MED ORDER — ONDANSETRON HCL 4 MG/2ML IJ SOLN: 4.0000 mg | Freq: Four times a day (QID) | INTRAMUSCULAR | Status: DC | PRN

## 2018-07-03 MED ORDER — FENTANYL CITRATE (PF) 100 MCG/2ML IJ SOLN
INTRAMUSCULAR | Status: DC | PRN
  Administered 2018-07-03 (×4): 50 ug via INTRAVENOUS

## 2018-07-03 MED ORDER — INSULIN ASPART 100 UNIT/ML ~~LOC~~ SOLN: 0.0000 [IU] | Freq: Three times a day (TID) | SUBCUTANEOUS | Status: DC

## 2018-07-03 MED ORDER — METOPROLOL TARTRATE 5 MG/5ML IV SOLN
2.5000 mg | Freq: Four times a day (QID) | INTRAVENOUS | Status: DC
  Administered 2018-07-03: 2.5 mg via INTRAVENOUS
  Filled 2018-07-03: qty 5

## 2018-07-03 MED ORDER — METOPROLOL TARTRATE 5 MG/5ML IV SOLN
5.0000 mg | Freq: Four times a day (QID) | INTRAVENOUS | Status: DC
  Administered 2018-07-04 – 2018-07-09 (×21): 5 mg via INTRAVENOUS
  Filled 2018-07-03 (×21): qty 5

## 2018-07-03 MED ORDER — DEXTROSE-NACL 5-0.45 % IV SOLN
INTRAVENOUS | Status: DC
  Administered 2018-07-03 (×2): via INTRAVENOUS

## 2018-07-03 MED ORDER — ROCURONIUM BROMIDE 10 MG/ML (PF) SYRINGE
PREFILLED_SYRINGE | INTRAVENOUS | Status: AC
  Filled 2018-07-03: qty 10

## 2018-07-03 SURGICAL SUPPLY — 47 items
BLADE EXTENDED COATED 6.5IN (ELECTRODE) ×3 IMPLANT
BLADE HEX COATED 2.75 (ELECTRODE) ×3 IMPLANT
CHLORAPREP W/TINT 26ML (MISCELLANEOUS) ×3 IMPLANT
CLIP VESOCCLUDE LG 6/CT (CLIP) IMPLANT
COVER SURGICAL LIGHT HANDLE (MISCELLANEOUS) ×3 IMPLANT
COVER WAND RF STERILE (DRAPES) ×2 IMPLANT
DRAPE LAPAROSCOPIC ABDOMINAL (DRAPES) ×3 IMPLANT
DRSG TEGADERM 4X4.75 (GAUZE/BANDAGES/DRESSINGS) ×2 IMPLANT
ELECT REM PT RETURN 15FT ADLT (MISCELLANEOUS) ×3 IMPLANT
GAUZE SPONGE 4X4 12PLY STRL (GAUZE/BANDAGES/DRESSINGS) ×3 IMPLANT
GLOVE BIO SURGEON STRL SZ7 (GLOVE) ×6 IMPLANT
GLOVE BIOGEL PI IND STRL 7.0 (GLOVE) ×1 IMPLANT
GLOVE BIOGEL PI IND STRL 7.5 (GLOVE) ×2 IMPLANT
GLOVE BIOGEL PI INDICATOR 7.0 (GLOVE) ×6
GLOVE BIOGEL PI INDICATOR 7.5 (GLOVE) ×4
GOWN STRL REUS W/ TWL XL LVL3 (GOWN DISPOSABLE) ×1 IMPLANT
GOWN STRL REUS W/TWL LRG LVL3 (GOWN DISPOSABLE) ×10 IMPLANT
GOWN STRL REUS W/TWL XL LVL3 (GOWN DISPOSABLE) ×1 IMPLANT
LEGGING LITHOTOMY PAIR STRL (DRAPES) IMPLANT
LIGASURE IMPACT 36 18CM CVD LR (INSTRUMENTS) ×2 IMPLANT
NS IRRIG 1000ML POUR BTL (IV SOLUTION) ×6 IMPLANT
PACK COLON (CUSTOM PROCEDURE TRAY) ×1 IMPLANT
SHEARS FOC LG CVD HARMONIC 17C (MISCELLANEOUS) IMPLANT
SHEARS HARMONIC ACE PLUS 36CM (ENDOMECHANICALS) IMPLANT
STAPLER CUT CVD 40MM BLUE (STAPLE) ×2 IMPLANT
STAPLER GUN LINEAR PROX 60 (STAPLE) IMPLANT
STAPLER PROXIMATE 75MM BLUE (STAPLE) ×2 IMPLANT
STAPLER VISISTAT 35W (STAPLE) ×1 IMPLANT
SUT ETHILON 3 0 PS 1 (SUTURE) ×2 IMPLANT
SUT PDS AB 1 CTX 36 (SUTURE) IMPLANT
SUT PDS AB 1 TP1 96 (SUTURE) ×2 IMPLANT
SUT PDS AB 3-0 SH 27 (SUTURE) ×1 IMPLANT
SUT PDS AB 4-0 SH 27 (SUTURE) IMPLANT
SUT PROLENE 2 0 BLUE (SUTURE) IMPLANT
SUT SILK 2 0 (SUTURE) ×3
SUT SILK 2 0 SH CR/8 (SUTURE) ×6 IMPLANT
SUT SILK 2 0SH CR/8 30 (SUTURE) IMPLANT
SUT SILK 2-0 18XBRD TIE 12 (SUTURE) ×2 IMPLANT
SUT SILK 2-0 30XBRD TIE 12 (SUTURE) IMPLANT
SUT SILK 3 0 (SUTURE) ×3
SUT SILK 3 0 SH CR/8 (SUTURE) ×4 IMPLANT
SUT SILK 3-0 18XBRD TIE 12 (SUTURE) ×2 IMPLANT
SUT VIC AB 3-0 SH 18 (SUTURE) ×4 IMPLANT
TOWEL OR NON WOVEN STRL DISP B (DISPOSABLE) ×3 IMPLANT
TRAY FOLEY MTR SLVR 14FR STAT (SET/KITS/TRAYS/PACK) ×3 IMPLANT
TRAY FOLEY MTR SLVR 16FR STAT (SET/KITS/TRAYS/PACK) IMPLANT
YANKAUER SUCT BULB TIP NO VENT (SUCTIONS) ×3 IMPLANT

## 2018-07-03 NOTE — Progress Notes (Signed)
[facility] Progress Note Patient Name: Felicia Carlson DOB: 1925-02-03 MRN: [id number]   Date of Service  07/03/2018  HPI/Events of Note  Chronic afib, ulcerative colitis flare started on steroids underwent colonoscopy/biopsy on 11/7 to rule out infection prior to initation of biologics.  Underwent colectomy today for perforated sigmoid colon.  eICU Interventions  Afib RVR 130s, hypertensive, despite morphine for pain control Will restart beta blocker, will give through IV for now as patient NPO        Judd Lien 07/03/2018, 8:20 PM

## 2018-07-03 NOTE — Anesthesia Procedure Notes (Signed)
Procedure Name: Intubation Date/Time: 07/03/2018 4:41 PM Performed by: West Pugh, CRNA Pre-anesthesia Checklist: Patient identified, Emergency Drugs available, Suction available, Patient being monitored and Timeout performed Patient Re-evaluated:Patient Re-evaluated prior to induction Oxygen Delivery Method: Circle system utilized Preoxygenation: Pre-oxygenation with 100% oxygen Induction Type: IV induction, Rapid sequence and Cricoid Pressure applied Laryngoscope Size: Mac and 3 Grade View: Grade I Tube type: Subglottic suction tube Tube size: 7.0 mm Number of attempts: 2 Airway Equipment and Method: Stylet Placement Confirmation: ETT inserted through vocal cords under direct vision,  positive ETCO2,  CO2 detector and breath sounds checked- equal and bilateral Secured at: 20 cm Tube secured with: Tape Dental Injury: Teeth and Oropharynx as per pre-operative assessment  Comments: Regular oral ETT switched to subglottic due to post op ventilation.

## 2018-07-03 NOTE — Progress Notes (Signed)
[name] 8:09 AM  Subjective: Events of last night discussed with Dr. Cristina Gong and surgical note appreciated and patient says she is a little better this morning with a little less pain and only minimal bowel movement no new complaints  Objective: Vital signs stable afebrile no acute distress exam pertinent for a little more guarding and a touch of rebound which is worse than yesterday white count still stable chemistries okay  Assessment: Free air in the abdomen questionable etiology  Plan: Surgery or observation or IR to try to remove air per surgical team and will check on tomorrow  [facility] E  Pager [phone] After 5PM or if no answer call [phone]

## 2018-07-03 NOTE — Progress Notes (Signed)
Pharmacy Antibiotic Note  Felicia Carlson is a 84 y.o. female admitted on 06/25/2018 with intra-abdominal infection.  Pharmacy has been consulted for zosyn dosing.  Plan: Zosyn 3.375g IV q8h (4 hour infusion). F/u scr/cultures    Temp (24hrs), Avg:98.3 F (36.8 C), Min:98.1 F (36.7 C), Max:98.5 F (36.9 C)  Recent Labs  Lab 06/27/18 0512 06/28/18 0355 06/30/18 0446 07/02/18 0348 07/03/18 0142  WBC 7.3 4.5 4.7 6.5 5.5  CREATININE 0.81 1.01* 0.63 0.84 0.73    Estimated Creatinine Clearance: 41.8 mL/min (by C-G formula based on SCr of 0.73 mg/dL).    Allergies  Allergen Reactions  . Isovue [Iopamidol] Hives and Rash    Pt  Had hives, rash and erythema her chest, face and neck.  Slight eye swelling.  Pt given 50 mg po benadryl.  Pt also developed severe abd pain during contrast injection and continued to get worse.  For those reasons the radiologist had the pt transported by EMS to [facility].  Pt needs full premeds in the future.  J Bohm  RTRCT  . Tetanus Immune Globulin Swelling    Antimicrobials this admission: 11/14 zosyn >>    >>   Dose adjustments this admission:   Microbiology results:  BCx:   UCx:    Sputum:    MRSA PCR:   Thank you for allowing pharmacy to be a part of this patient's care.  Dorrene German 07/03/2018 4:42 AM

## 2018-07-03 NOTE — Consult Note (Addendum)
Felicia Carlson  1924/10/20 [id number]  CARE TEAM:  PCP: Lajean Manes, MD  Outpatient Care Team: Patient Care Team: Lajean Manes, MD as PCP - General (Internal Medicine)  Inpatient Treatment Team: Treatment Team: Attending Provider: Flora Lipps, MD; Consulting Physician: Ronnette Juniper, MD; Registered Nurse: Jennye Boroughs, RN; Registered Nurse: Lolita Cram, RN; Rounding Team: Fatima Blank, MD; Consulting Physician: Nolon Nations, MD; Technician: Margarita Sermons, NT; Charge Nurse: Abigail Miyamoto, RN   This patient is a 84 y.o.female who presents today for surgical evaluation at the request of Dr Cristina Gong.   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  History of ulcerative colitis.  84 year old female with a history of chronic ulcerative colitis.  Primarily rectum and sigmoid.  History of chronic atrial fibrillation on Xarelto with pretty good cardiac function.  Had worsening crampy abdominal pain and rectal bleeding.  Decreased hemoglobin.  Had been on prednisone taper with mesalamine.  Underwent flexible sigmoidoscopy a week ago.  CT scan did not show any perforation or major issues the day before.  Flex sig was K showed very significant proctocolitis.  Admitted to be stabilized and more aggressive immunosuppression.  She is been in the hospital for a week.  Some intermittent abdominal complaints.  Based on concerns, CAT scan done last night which showed large volume of free air.  Proctocolitis.  Called by Dr. Cristina Gong around 3 AM.  Patient with some mild abdominal discomfort but feeling better overall.  Took her Xarelto earlier.  Options being discussed.  Patient has had a prior open hysterectomy and appendectomy.  She moved her bowels after the CAT scan.  She has some abdominal discomfort but not nauseated.  Appetite is down.  History of irregular bowels.  History of some constipation based on prior CAT scans and studies.  No personal nor family history of GI/colon cancer,   irritable bowel syndrome, allergy such as Celiac Sprue, dietary/dairy problems, colitis, ulcers nor gastritis.  No recent sick contacts/gastroenteritis.  No travel outside the country.  No changes in diet.  No dysphagia to solids or liquids.  No significant heartburn or reflux.  No hematochezia, hematemesis, coffee ground emesis.  No evidence of prior gastric/peptic ulceration.    Assessment  [name]  84 y.o. female  7 Days Post-Op  Procedure(s): FLEXIBLE SIGMOIDOSCOPY BIOPSY  Problem List:  Principal Problem:   Pneumoperitoneum Active Problems:   Ulcerative colitis with complication (HCC)   Chronic anticoagulation   Lower GI bleed   Abdominal cramping   Colitis, acute   Abdominal infection (HCC)   Constipation   Rectal bleeding   Hyperglycemia   Chronic atrial fibrillation   Pneumoperitoneum with history of flexible sigmoidoscopy on a ulcerativecolitis patient on steroids.  No evidence of any ulcer or foregut problems in the past  Plan:  N.p.o.  Hold anticoagulation.    IV antibiotics.  Serial abdominal exams.  Usual standard care is abdominal exploration with a Phillip Heal patch of perforated ulcer versus Hartmann resection with a more likely perforated sigmoid colon at her area of chronic ulcerative colitis.  She gives no history of prior ulcer issues and there is no extravasation of contrast and her uninflamed stomach and duodenal sweep, which argues against a foregut etiology.  The more likely scenario is that she had some type of perforation during her flexible sigmoidoscopy a week ago.  There is no evidence of any abscesses or ascites or other major contamination by CAT scan aside from gas.  Gastroenterology notes that usually they use carbon dioxide and that should be absorbed by now..   With her being anticoagulated and anemic, advanced age, etc; operative risks are increased.  Would hold Xarelto.  This may be a situation where interventional radiology could  aspirate the gas and try and convert this into a less intense issue and treat with aggressive antibiotics like a perforated diverticulitis.  See if it will resolve without an operation since she is hemodynamically stable, having bowel function, no major contamination of abscesses or ascites.  If she had a Hartmann resection, she could have some rectal stump blowout.  However an elderly woman on steroids will not tolerate any infection well, despite the fact that she seems to be doing relatively okay.  More aggressive abdominal expiration Hartmann/colostomy gives a better chance of getting his infection under control.  Reconsidering Hartmann resection soon, especially if no easy answers here.  She does not look particularly toxic at this time but exam can be challenging in an elderly woman immunosuppressed on steroids.  She claims she is felt a little better after a bowel movements.  Dr. Cristina Gong did not feel too strongly about doing emergency surgery at this time; but, he obviously was concerned enough to call in the middle the night.  The patient was hesitant to make a decision at this time.  She did have an echocardiogram done last month that showed ejection fraction 65%.  No major abnormalities.  Mild aortic valve regurgitation.  Most likely patient could tolerate emergency surgery if needed.  My partner, Dr. Donne Hazel to reevaluate later this morning  VTE prophylaxis- SCDs, etc   Mobilize as tolerated to help recovery  50 minutes spent in review, evaluation, examination, counseling, and coordination of care.  More than 50% of that time was spent in counseling.  Adin Hector, MD, FACS, MASCRS Gastrointestinal and Minimally Invasive Surgery    [address] [place], [place] [address] [phone] Main / Paging [phone] Fax   07/03/2018      Past Medical History:  Diagnosis Date  . Arthritis   . Cancer [facility]) 2007   breast cancer right side  . Chest pain   .  Chronic kidney disease    stent in kidney right side  . Dyspnea   . GERD (gastroesophageal reflux disease)    sometimes  . Headache    shingles right side of head and right eye  . Hypertension   . Personal history of chemotherapy   . Personal history of radiation therapy     Past Surgical History:  Procedure Laterality Date  . ABDOMINAL HYSTERECTOMY    . APPENDECTOMY    . BIOPSY  06/26/2018   Procedure: BIOPSY;  Surgeon: Ronnette Juniper, MD;  Location: [facility];  Service: Gastroenterology;;  . BREAST LUMPECTOMY Right 2007  . BREAST SURGERY Right 2011   lumpectomy  . EYE SURGERY Bilateral 2015   cataracts  . FLEXIBLE SIGMOIDOSCOPY N/A 06/26/2018   Procedure: FLEXIBLE SIGMOIDOSCOPY;  Surgeon: Ronnette Juniper, MD;  Location: [facility];  Service: Gastroenterology;  Laterality: N/A;  . TONSILLECTOMY     as a child  . TOTAL KNEE ARTHROPLASTY Right 10/26/2014   Procedure: RIGHT TOTAL KNEE ARTHROPLASTY;  Surgeon: [name], MD;  Location: [place];  Service: Orthopedics;  Laterality: Right;    Social History   Socioeconomic History  . Marital status: Divorced    Spouse name: Not on file  . Number of children: Not on file  .  Years of education: Not on file  . Highest education level: Not on file  Occupational History  . Not on file  Social Needs  . Financial resource strain: Not on file  . Food insecurity:    Worry: Not on file    Inability: Not on file  . Transportation needs:    Medical: Not on file    Non-medical: Not on file  Tobacco Use  . Smoking status: Never Smoker  . Smokeless tobacco: Never Used  Substance and Sexual Activity  . Alcohol use: Yes    Comment: wine occasional  . Drug use: No  . Sexual activity: Not on file  Lifestyle  . Physical activity:    Days per week: Not on file    Minutes per session: Not on file  . Stress: Not on file  Relationships  . Social connections:    Talks on phone: Not on file    Gets together: Not on file     Attends religious service: Not on file    Active member of club or organization: Not on file    Attends meetings of clubs or organizations: Not on file    Relationship status: Not on file  . Intimate partner violence:    Fear of current or ex partner: Not on file    Emotionally abused: Not on file    Physically abused: Not on file    Forced sexual activity: Not on file  Other Topics Concern  . Not on file  Social History Narrative  . Not on file    Family History  Problem Relation Age of Onset  . Breast cancer Sister 64    Current Facility-Administered Medications  Medication Dose Route Frequency Provider Last Rate Last Dose  . dextrose 5 %-0.45 % sodium chloride infusion   Intravenous Continuous Ronald Lobo, MD 75 mL/hr at 07/03/18 0608    . methylPREDNISolone sodium succinate (SOLU-MEDROL) 40 mg/mL injection 40 mg  40 mg Intravenous Daily Buccini, Robert, MD      . ondansetron [facility]) tablet 4 mg  4 mg Oral Q6H PRN Ronnette Juniper, MD   4 mg at 07/02/18 2126   Or  . ondansetron (ZOFRAN) injection 4 mg  4 mg Intravenous Q6H PRN Ronnette Juniper, MD   4 mg at 06/27/18 0846  . piperacillin-tazobactam (ZOSYN) IVPB 3.375 g  3.375 g Intravenous Q8H Dorrene German, RPH      . sodium chloride 0.9 % bolus 250 mL  250 mL Intravenous Once Ronnette Juniper, MD         Allergies  Allergen Reactions  . Isovue [Iopamidol] Hives and Rash    Pt  Had hives, rash and erythema her chest, face and neck.  Slight eye swelling.  Pt given 50 mg po benadryl.  Pt also developed severe abd pain during contrast injection and continued to get worse.  For those reasons the radiologist had the pt transported by EMS to [facility].  Pt needs full premeds in the future.  J Bohm  RTRCT  . Tetanus Immune Globulin Swelling    ROS:   All other systems reviewed & are negative except per HPI or as noted below: Constitutional:  No fevers, chills, sweats.  Weight stable Eyes:  No vision changes, No discharge HENT:  No sore  throats, nasal drainage Lymph: No neck swelling, No bruising easily Pulmonary:  No cough, productive sputum CV: No orthopnea, PND  Patient walks 5 minutes without difficulty.  No exertional chest/neck/shoulder/arm pain. GI:  No personal nor family history of GI/colon cancer, irritable bowel syndrome, allergy such as Celiac Sprue, dietary/dairy problems, colitis, ulcers nor gastritis.  No recent sick contacts/gastroenteritis.  No travel outside the country.  No changes in diet.  Hematochezia with anemia.  Chronic ulcerative colitis as noted above Renal: No UTIs, No hematuria Genital:  No drainage, bleeding, masses Musculoskeletal: No severe joint pain.  Good ROM major joints Skin:  No sores or lesions.  No rashes Heme/Lymph:  No easy bleeding.  No swollen lymph nodes Neuro: No focal weakness/numbness.  No seizures Psych: No suicidal ideation.  No hallucinations  BP 133/90 (BP Location: Left Leg)   Pulse (!) 50   Temp 98.8 F (37.1 C) (Oral)   Resp 18   SpO2 96%   Physical Exam: General: Pt awake/alert/oriented x4 in no major acute distress Eyes: PERRL, normal EOM. Sclera nonicteric Neuro: CN II-XII intact w/o focal sensory/motor deficits. Lymph: No head/neck/groin lymphadenopathy Psych:  No delerium/psychosis/paranoia HENT: Normocephalic, Mucus membranes moist.  No thrush Neck: Supple, No tracheal deviation Chest: No pain.  Good respiratory excursion. CV:  Pulses intact.  Regular rhythm Abdomen: Soft, Moderately distended.  Mild tenderness bilateral lower quadrants.  Less so in the upper abdomen.  No guarding.  Low midline incision without hernias. Gen:  No inguinal hernias.  No inguinal lymphadenopathy.   Ext:  SCDs BLE.  No significant edema.  No cyanosis Skin: No petechiae / purpurea.  No major sores Musculoskeletal: No severe joint pain.  Good ROM major joints   Results:   Labs: Results for orders placed or performed during the hospital encounter of 06/25/18 (from the past  48 hour(s))  CBC     Status: Abnormal   Collection Time: 07/02/18  3:48 AM  Result Value Ref Range   WBC 6.5 4.0 - 10.5 K/uL   RBC 2.97 (L) 3.87 - 5.11 MIL/uL   Hemoglobin 8.9 (L) 12.0 - 15.0 g/dL   HCT 28.6 (L) 36.0 - 46.0 %   MCV 96.3 80.0 - 100.0 fL   MCH 30.0 26.0 - 34.0 pg   MCHC 31.1 30.0 - 36.0 g/dL   RDW 15.4 11.5 - 15.5 %   Platelets 158 150 - 400 K/uL   nRBC 0.0 0.0 - 0.2 %    Comment: Performed at [facility], [place] [address] [place], [place] [address]  Comprehensive metabolic panel     Status: Abnormal   Collection Time: 07/02/18  3:48 AM  Result Value Ref Range   Sodium 135 135 - 145 mmol/L   Potassium 4.7 3.5 - 5.1 mmol/L   Chloride 100 98 - 111 mmol/L   CO2 24 22 - 32 mmol/L   Glucose, Bld 245 (H) 70 - 99 mg/dL   BUN 20 8 - 23 mg/dL   Creatinine, Ser 0.84 0.44 - 1.00 mg/dL   Calcium 8.6 (L) 8.9 - 10.3 mg/dL   Total Protein 4.6 (L) 6.5 - 8.1 g/dL   Albumin 2.0 (L) 3.5 - 5.0 g/dL   AST 14 (L) 15 - 41 U/L   ALT 12 0 - 44 U/L   Alkaline Phosphatase 84 38 - 126 U/L   Total Bilirubin 0.7 0.3 - 1.2 mg/dL   GFR calc non Af Amer 59 (L) >60 mL/min   GFR calc Af Amer >60 >60 mL/min    Comment: (NOTE) The eGFR has been calculated using the CKD EPI equation. This calculation has not been validated in all clinical situations. eGFR's persistently <60 mL/min signify  possible Chronic Kidney Disease.    Anion gap 11 5 - 15    Comment: Performed at [facility], [place] [address], [place], [place] [address]  CBC with Differential/Platelet     Status: Abnormal   Collection Time: 07/03/18  1:42 AM  Result Value Ref Range   WBC 5.5 4.0 - 10.5 K/uL   RBC 2.97 (L) 3.87 - 5.11 MIL/uL   Hemoglobin 8.9 (L) 12.0 - 15.0 g/dL   HCT 28.6 (L) 36.0 - 46.0 %   MCV 96.3 80.0 - 100.0 fL   MCH 30.0 26.0 - 34.0 pg   MCHC 31.1 30.0 - 36.0 g/dL   RDW 15.2 11.5 - 15.5 %   Platelets 154 150 - 400 K/uL   nRBC 0.0 0.0 - 0.2 %   Neutrophils Relative % 82 %    Neutro Abs 4.5 1.7 - 7.7 K/uL   Lymphocytes Relative 9 %   Lymphs Abs 0.5 (L) 0.7 - 4.0 K/uL   Monocytes Relative 8 %   Monocytes Absolute 0.4 0.1 - 1.0 K/uL   Eosinophils Relative 0 %   Eosinophils Absolute 0.0 0.0 - 0.5 K/uL   Basophils Relative 0 %   Basophils Absolute 0.0 0.0 - 0.1 K/uL   Immature Granulocytes 1 %   Abs Immature Granulocytes 0.04 0.00 - 0.07 K/uL    Comment: Performed at [facility], [place] [address], [place], [place] [address]  Comprehensive metabolic panel     Status: Abnormal   Collection Time: 07/03/18  1:42 AM  Result Value Ref Range   Sodium 132 (L) 135 - 145 mmol/L   Potassium 4.7 3.5 - 5.1 mmol/L   Chloride 96 (L) 98 - 111 mmol/L   CO2 27 22 - 32 mmol/L   Glucose, Bld 227 (H) 70 - 99 mg/dL   BUN 21 8 - 23 mg/dL   Creatinine, Ser 0.73 0.44 - 1.00 mg/dL   Calcium 8.7 (L) 8.9 - 10.3 mg/dL   Total Protein 5.0 (L) 6.5 - 8.1 g/dL   Albumin 2.1 (L) 3.5 - 5.0 g/dL   AST 16 15 - 41 U/L   ALT 13 0 - 44 U/L   Alkaline Phosphatase 88 38 - 126 U/L   Total Bilirubin 0.3 0.3 - 1.2 mg/dL   GFR calc non Af Amer >60 >60 mL/min   GFR calc Af Amer >60 >60 mL/min    Comment: (NOTE) The eGFR has been calculated using the CKD EPI equation. This calculation has not been validated in all clinical situations. eGFR's persistently <60 mL/min signify possible Chronic Kidney Disease.    Anion gap 9 5 - 15    Comment: Performed at [facility], [place] [address] [place],  [address]    Imaging / Studies: Ct Abdomen Pelvis Wo Contrast  Result Date: 07/03/2018 CLINICAL DATA:  Acute onset of worsening generalized abdominal pain. Bright red blood in stool. Status post recent colonoscopy. EXAM: CT ABDOMEN AND PELVIS WITHOUT CONTRAST TECHNIQUE: Multidetector CT imaging of the abdomen and pelvis was performed following the standard protocol without IV contrast. COMPARISON:  CT of the abdomen and pelvis from 06/25/2018 FINDINGS: Lower  chest: There is a slightly unusual appearance to pleural fluid adjacent to the descending thoracic aorta, but this is thought to reflect a trace pleural effusion and associated atelectasis. Trace bilateral pleural effusions are noted. Scattered coronary artery calcifications are seen. Hepatobiliary: A nonspecific 1.1 cm hypodensity is noted at the hepatic dome. The liver is  otherwise unremarkable. The gallbladder is unremarkable in appearance. The common bile duct remains normal in caliber. A large amount of free air is noted tracking about the liver. Pancreas: The pancreas is within normal limits. Spleen: The spleen is diminutive, with scattered calcified granulomata. Adrenals/Urinary Tract: The adrenal glands are unremarkable in appearance. A small parenchymal calcification is noted at the right kidney. The kidneys are otherwise unremarkable. There is no evidence of hydronephrosis. No renal or ureteral stones are identified. No perinephric stranding is seen. Stomach/Bowel: The stomach is unremarkable in appearance. The small bowel is within normal limits. The appendix is not visualized; there is no evidence for appendicitis. A large amount of free air is noted filling the abdomen, concerning for bowel perforation. A potential site of bowel perforation is at the proximal descending colon, given the pattern of air in this region. The colon is otherwise largely filled with stool. Persistent wall thickening along the mid sigmoid colon is concerning for colitis. Underlying diverticulosis is noted along the sigmoid colon. Vascular/Lymphatic: Scattered calcification is seen along the abdominal aorta and its branches. The abdominal aorta is otherwise grossly unremarkable. The inferior vena cava is grossly unremarkable. No retroperitoneal lymphadenopathy is seen. No pelvic sidewall lymphadenopathy is identified. Reproductive: The bladder is mildly distended and grossly unremarkable. The patient is status post hysterectomy.  No suspicious adnexal masses are seen. Other: Soft tissue edema is noted along the lateral abdominal wall and about the pelvis. Musculoskeletal: No acute osseous abnormalities are identified. The visualized musculature is unremarkable in appearance. IMPRESSION: 1. Large amount of free air noted filling the abdomen, concerning for bowel perforation. Question site of bowel perforation at the proximal descending colon, given the pattern of air in this region. 2. Persistent wall thickening along the mid sigmoid colon is concerning for colitis. Underlying diverticulosis along the sigmoid colon. 3. Somewhat unusual appearance to pleural fluid adjacent to the descending thoracic aorta, but this is thought to reflect a trace pleural effusion and associated atelectasis. Trace bilateral pleural effusions noted. 4. Scattered coronary artery calcifications seen. 5. Nonspecific 1.1 cm hypodensity at the hepatic dome; this may reflect a small cyst. 6. Soft tissue edema along the lateral abdominal wall and about the pelvis. Aortic Atherosclerosis (ICD10-I70.0). Critical Value/emergent results were called by telephone at the time of interpretation on 07/02/2018 at 11:32 pm to the Nursing team at [facility], who verbally acknowledged these results. Electronically Signed   By: Garald Balding M.D.   On: 07/03/2018 00:12   Ct Abdomen Pelvis Wo Contrast  Result Date: 06/25/2018 CLINICAL DATA:  Intermittent rectal bleeding for 2 weeks. EXAM: CT ABDOMEN AND PELVIS WITHOUT CONTRAST TECHNIQUE: Multidetector CT imaging of the abdomen and pelvis was performed following the standard protocol without IV contrast. COMPARISON:  CT abdomen and pelvis 06/13/2018. FINDINGS: Lower chest: Mild dependent atelectasis is seen in the lung bases. No pleural or pericardial effusion. Calcific aortic and coronary atherosclerosis noted. Hepatobiliary: A few tiny stones are seen layering dependently in the fundus of the gallbladder. No evidence of  cholecystitis. Gallbladder and biliary tree appear normal. Pancreas: No pancreatic ductal dilatation or surrounding inflammatory changes. The pancreas is atrophic as seen on the prior exam. Spleen: Normal in size. Calcifications in the spleen consistent with old granulomatous disease noted. Adrenals/Urinary Tract: The adrenal glands are unremarkable. Mild atrophy of the right kidney is seen. A 0.3 cm in diameter nonobstructing stone in the upper pole of the right kidney is noted. No hydronephrosis or ureteral stones. Urinary bladder appears  normal. Stomach/Bowel: The rectosigmoid colon is decompressed but its walls appear thickened. A few scattered diverticula are identified but no focal inflammatory change about a diverticulum is seen. The descending and sigmoid colon are tortuous. There is a large volume of stool in the ascending and transverse colon. No pneumatosis or portal venous gas. The appendix has been removed. The stomach and small bowel appear normal. Vascular/Lymphatic: Aortic atherosclerosis. No enlarged abdominal or pelvic lymph nodes. Reproductive: Status post hysterectomy. No adnexal masses. Other: No free or focal fluid collection. No free intraperitoneal air. Musculoskeletal: No acute or focal abnormality. Scoliosis and multilevel lumbar degenerative change noted. IMPRESSION: Although the rectosigmoid colon is decompressed, its walls appear thickened most consistent with infectious or inflammatory colitis. No CT signs of ischemia are present. Diverticulosis without diverticulitis. Large stool burden ascending and transverse colon. A few tiny gallstones are seen but there is no evidence of cholecystitis. 0.3 cm nonobstructing stone right kidney. Calcific aortic and coronary atherosclerosis. Electronically Signed   By: Inge Rise M.D.   On: 06/25/2018 15:31   Ct Abdomen Pelvis Wo Contrast  Result Date: 06/13/2018 CLINICAL DATA:  generalized abdominal pain, greatest over right abdomen,  diarrhea for 2-3 weeks. Previous appendectomy, hysterectomy and RIGHT breast lumpectomy. EXAM: CT ABDOMEN AND PELVIS WITHOUT CONTRAST TECHNIQUE: Multidetector CT imaging of the abdomen and pelvis was performed following the standard protocol without IV contrast. COMPARISON:  CT of the abdomen and pelvis on 09/26/2017 FINDINGS: Lower chest: There is subsegmental atelectasis at the lung bases. Coronary artery calcifications are present. Hepatobiliary: There is a stable 11 millimeter probable cyst within the LEFT hepatic lobe. Gallbladder is present. There are dependent partially calcified gallstones without CT evidence for acute cholecystitis. Pancreas: Unremarkable. No pancreatic ductal dilatation or surrounding inflammatory changes. Spleen: Calcified splenic granulomata. Otherwise the spleen is normal in appearance. Adrenals/Urinary Tract: Normal adrenal glands. Stable cyst within the UPPER pole of the LEFT kidney. There are punctate intrarenal calcifications bilaterally. No hydronephrosis. No ureteral obstruction. The bladder and visualized portion of the urethra are normal. Stomach/Bowel: The stomach and small bowel loops are normal in appearance. There is a large amount of stool particularly within the RIGHT colon. There is dilatation and wall thickening of the sigmoid colon. The wall thickening continues to level of the anus. There are scattered diverticula within this region the appearance favors inflammatory, ischemic, or infectious colitis over acute diverticulitis. Although thickening of the colonic wall throughout the sigmoid colon and rectum has been demonstrated on prior exams, the dilatation of this segment appears new or different. Mesenteric edema is noted in the pelvis. Vascular/Lymphatic: Small mesenteric lymph nodes are identified in the pelvis. No significant retroperitoneal adenopathy. There is significant atherosclerotic calcification of the abdominal aorta not associated aneurysm. Patient has a  RIGHT renal artery stent. Reproductive: Hysterectomy.  No adnexal mass. Other: No free pelvic fluid. Anterior abdominal wall is unremarkable. Musculoskeletal: There are degenerative changes in the LOWER thoracic spine and lumbar spine. No suspicious lytic or blastic lesions are identified. Stable small bone islands are identified in the LEFT ilium and RIGHT femoral head. IMPRESSION: 1. Significant stool within the RIGHT aspect of the colon. 2. Colonic diverticulosis. 3. Dilatation and wall thickening of the sigmoid colon which extends to the rectum. Associated mesenteric edema. Small mesocolon lymph nodes are also present. Findings favor infectious/inflammatory or ischemic colitis over diverticulitis. 4. Nonobstructing nephrolithiasis. 5. Splenic granulomata. 6. Coronary artery disease. 7.  Aortic atherosclerosis.  (ICD10-I70.0) 8. Hysterectomy. Electronically Signed   By: Benjamine Mola  Owens Shark M.D.   On: 06/13/2018 20:45   Dg Chest Port 1 View  Result Date: 06/25/2018 CLINICAL DATA:  Chest pain, shortness of breath. EXAM: PORTABLE CHEST 1 VIEW COMPARISON:  Radiographs of July 25, 2016. FINDINGS: Stable cardiomegaly. No pneumothorax or pleural effusion is noted. Elevated right hemidiaphragm is noted. No consolidative process is noted. Degenerative changes are seen involving both glenohumeral joints. IMPRESSION: No acute cardiopulmonary abnormality seen. Electronically Signed   By: Marijo Conception, M.D.   On: 06/25/2018 16:10   [place] Ekg Site Rite  Result Date: 06/14/2018 If Site Rite image not attached, placement could not be confirmed due to current cardiac rhythm.   Medications / Allergies: per chart  Antibiotics: Anti-infectives (From admission, onward)   Start     Dose/Rate Route Frequency Ordered Stop   07/03/18 1200  piperacillin-tazobactam (ZOSYN) IVPB 3.375 g     3.375 g 12.5 mL/hr over 240 Minutes Intravenous Every 8 hours 07/03/18 0444     07/03/18 0445  piperacillin-tazobactam (ZOSYN) IVPB  3.375 g     3.375 g 100 mL/hr over 30 Minutes Intravenous  Once 07/03/18 0440 07/03/18 0640   06/25/18 1600  cefTRIAXone (ROCEPHIN) 2 g in sodium chloride 0.9 % 100 mL IVPB  Status:  Discontinued     2 g 200 mL/hr over 30 Minutes Intravenous Every 24 hours 06/25/18 1546 06/27/18 1348   06/25/18 1600  metroNIDAZOLE (FLAGYL) IVPB 500 mg  Status:  Discontinued     500 mg 100 mL/hr over 60 Minutes Intravenous Every 8 hours 06/25/18 1546 06/27/18 1348        Note: Portions of this report may have been transcribed using voice recognition software. Every effort was made to ensure accuracy; however, inadvertent computerized transcription errors may be present.   Any transcriptional errors that result from this process are unintentional.    Adin Hector, MD, FACS, MASCRS Gastrointestinal and Minimally Invasive Surgery    [address] [place], [place] [address] [phone] Main / Paging [phone] Fax   07/03/2018

## 2018-07-03 NOTE — Progress Notes (Signed)
"  I have reviewed and concur with this student documentation."

## 2018-07-03 NOTE — Op Note (Signed)
Preoperative diagnosis: perforated sigmoid colon  Postoperative diagnosis: perforated sigmoid colon with fecal contamination Procedure: 1. Left  colectomy 2. End transverse colostomy 3. Takedown of splenic flexure Surgeon: Dr Serita Grammes Asst: Dr. Nedra Hai EBL: 75 cc Drains 71 Fr Blake drain to pelvis Anesthesia general Complications none Sponge and needle count correct times two dispo to recovery stable  Indications: This is a 66 yof with recent history of brbpr and flex sig that showed significant colitis. She has been ill for about a week and got a ct yesterday for increasing abdominal pain that showed large volume pneumoperitoneum.  I saw her this morning after my partner saw her in consultation.  She worsened over the day and I recommended laparotomy urgently this afternoon. I discussed plan with she and her daughter.    Procedure: After informed consent was obtained the patient was taken the operating room.  She was given antibiotics.  SCDs were placed.  She was placed under general anesthesia without complication.  A Foley catheter and nasogastric tube were then placed.  She was then prepped and draped in the standard sterile surgical fashion.  A surgical timeout was then performed.  I made a midline incision and entered into the abdomen.  there was a lot of free air and feculent contents in the abdominal cavity.    I then was able to take down the white line of Toldt.  I took this down distally as well as proximally to the splenic flexure.  I then released the omentum from the transverse colon.  Using cautery and careful dissection I then came around the splenic flexure completely and rotated this medially.  I then elected to divide the transverse colon just proximal to splenic flexure.  It appeared that in the sigmoid colon there was a small pinhole leaking stool and air.  She had very thickened colon on the entire left side and down onto her rectum as seen on her flex sig.   I  divided the transverse colon with a GIA stapler and then I used the LigaSure device to divide the mesentery.  I then took down the mesentery with the ligasure device and 2-0 silk suture.  I came to the rectum which was very thickened.  There was no real good place to divide this and this is what it appeared on her ct scan.   I then used a contour stapler staying right on the rectum to divide this and then removed the colon.  This was removed with a stitch marking the proximal end of the left colon.  Irrigation was then performed. I oversewed the rectal stump with 2-0 silk suture. I then placed a 45 [place] Blake drain in the pelvis.  I marked the rectal stump with 0 Prolene suture. I then ran the entire small bowel and there was no evidence of an injury.  The remainder of the colon looked healthy.  I then picked a point through the rectus on the right side that appeared to be good for a colostomy.  I made a circular incision to remove the core overlying the fascia.  I knew the fascia in a cruciate incision.  I then entered the peritoneum and incised this up to 2 fingers.  I brought the transverse colon through.  This came through easily and was viable.  I then closed the fascia with #1 looped PDS.  I left the wound open after this and I eventually placed a negative pressure dressing over that.  I then  matured the colostomy after opening it with 3-0 Vicryl suture.  An  appliance was placed.  A Biopatch and a dressing were placed over the JP.  She tolerated this well was extubated will be transferred to stepdown.

## 2018-07-03 NOTE — Progress Notes (Signed)
PROGRESS NOTE    Felicia Carlson  LZJ:[id number] DOB: [birth year] DOA: 06/25/2018   PCP: Lajean Manes, MD   Brief Narrative:   84 y.o.femalewith medical history significant ofulcerative colitis, recently discharged from the hospital on 06/20/2018 on steroids for an ulcerative colitis flare with infectious work-up negative, chronic atrial fibrillation on Xarelto, her Xarelto was held 4 days prior to admission by her PCP. Since discharge, patient was having crampy abdominal pain with mucousy hematochezia, reported anorexia and about 10 pounds in the last 2 weeks.she had an appointment with [facility] but her daughter brought her in due to her severe abdominal pain not able to tolerate anything by mouth. In the ED, patient was found to be normotensive with vitals stable, mild leukopenia with a hemoglobin of 9.7 on discharge on 08/30/2017 it was 10.8, lactic acid 4.8, CT scan of the abdomen and pelvis done on admission showed rectosigmoid colon wall thickening, no signs of ischemic colitis, large stool burden in the ascending and transverse colon.  Patient did not improve significantly in the hospital despite mesalamine and steroids so CT scan was repeated on 07/02/2018 which showed pneumoperitoneum.  Surgery was then consulted including GI.    Assessment & Plan:   Principal Problem:   Pneumoperitoneum Active Problems:   Lower GI bleed   Ulcerative colitis with complication (HCC)   Abdominal cramping   Colitis, acute   Abdominal infection (HCC)   Constipation   Rectal bleeding   Hyperglycemia   Chronic atrial fibrillation   Chronic anticoagulation  Persistent abdominal pain repeat CT scan shows pneumoperitoneum.  Patient is currently n.p.o.  Surgery and GI on board.  Does not have good IV access.  I spoke with the patient regarding a PICC line placement and she is agreeable to it.  Lower GI bleed secondary to diffuse ulcerative colitis of the rectum, sigmoid and descending/transverse colon.   patient was on prednisone and mesalamine.    GI is awaiting biopsy for CMV prior to considering Remicade due to acute condition could be on hold..  CRP elevated at 19.8.  On full liquid diet.  QuantiFERON TB Gold plus was negative.  Hepatitis B surface antigen was negative.  Blood cultures have been negative so far.  Surgical pathology still pending.  Medications on hold at this time.  Consider IV.  Chronic atrial fibrillation.  .  Hemoglobin has not dropped.  We will continue to monitor.  Hold Xarelto for now due to n.p.o. status and possible need for surgery.  Hypertension hold oral medication.  Monitor blood pressure.  Debility, deconditioning.  Patient has been seen by physical therapy.  Recommend home health with 24-hour supervision now the patient has pneumoperitoneum and might need surgery.  Potential need to skilled nursing facility placement.  DVT prophylaxis   Xarelto-hold, initiate SCD.  Code Status: DO NOT RESUSCITATE  Family Communication:   Spoke with the patient's daughter Felicia Carlson Flow on the phone and updated her about the clinical condition of the patient including pneumoperitoneum.  Disposition Plan:  Home health/skilled nursing facility, likely more than 3 days.  Consultants: GI, general surgery  Procedures:  flex sigmoidoscopy on 06/26/2018  Antimicrobials: Zosyn > 07/03/2018  Subjective:   Per the nursing staff, patient had bloody bowel movement yesterday.  Complains of mild abdominal cramps.  No nausea.  She does have generalized weakness.  Still on liquids.  No chest pain or shortness of breath.  Objective: Vitals:   07/02/18 1436 07/02/18 2110 07/03/18 0306 07/03/18 0525  BP:  132/65 (!) 162/84 (!) 152/82 133/90  Pulse: 96 82 (!) 104 (!) 50  Resp: 17 18 18 18   Temp: 98.5 F (36.9 C) 98.1 F (36.7 C) 98.3 F (36.8 C) 98.8 F (37.1 C)  TempSrc: Oral Oral Oral Oral  SpO2: 94% 97% 100% 96%    Intake/Output Summary (Last 24 hours) at 07/03/2018 0902 Last  data filed at 07/03/2018 0530 Gross per 24 hour  Intake 360 ml  Output 800 ml  Net -440 ml   There were no vitals filed for this visit.  Physical Examination:  General exam: Appears calm and comfortable ,Not in distress, thinly built. HEENT:PERRL,Oral mucosa moist Respiratory system: Bilateral equal air entry, normal vesicular breath sounds, no wheezes or crackles  Cardiovascular system: S1 & S2 heard, RRR.  Gastrointestinal system: Abdomen is nondistended, but nonspecific tenderness noted.  No organomegaly or masses felt. Normal bowel sounds heard. Central nervous system: Alert and oriented. No focal neurological deficits. Extremities: No edema, no clubbing ,no cyanosis, distal peripheral pulses palpable. Skin: No rashes, lesions or ulcers,no icterus ,no pallor MSK: Normal muscle bulk,tone ,power  Data Reviewed: I have personally reviewed following labs and imaging studies  CBC: Recent Labs  Lab 06/27/18 0512 06/28/18 0355 06/30/18 0446 07/02/18 0348 07/03/18 0142  WBC 7.3 4.5 4.7 6.5 5.5  NEUTROABS 6.5  --   --   --  4.5  HGB 9.2* 8.4* 8.5* 8.9* 8.9*  HCT 29.4* 26.5* 25.2* 28.6* 28.6*  MCV 95.1 96.0 90.3 96.3 96.3  PLT 241 215 146* 158 562   Basic Metabolic Panel: Recent Labs  Lab 06/27/18 0512 06/28/18 0355 06/30/18 0446 07/02/18 0348 07/03/18 0142  NA 137 132* 137 135 132*  K 4.1 3.6 5.0 4.7 4.7  CL 102 98 105 100 96*  CO2 25 26 27 24 27   GLUCOSE 163* 191* 203* 245* 227*  BUN 15 18 11 20 21   CREATININE 0.81 1.01* 0.63 0.84 0.73  CALCIUM 7.7* 7.9* 8.0* 8.6* 8.7*  MG  --   --  1.9  --   --    GFR: Estimated Creatinine Clearance: 41.8 mL/min (by C-G formula based on SCr of 0.73 mg/dL). Liver Function Tests: Recent Labs  Lab 06/27/18 0512 06/30/18 0446 07/02/18 0348 07/03/18 0142  AST 8* 16 14* 16  ALT 14 14 12 13   ALKPHOS 63 66 84 88  BILITOT 0.5 0.5 0.7 0.3  PROT 4.7* 4.3* 4.6* 5.0*  ALBUMIN 2.1* 1.9* 2.0* 2.1*   No results for input(s):  LIPASE, AMYLASE in the last 168 hours. No results for input(s): AMMONIA in the last 168 hours. Coagulation Profile: No results for input(s): INR, PROTIME in the last 168 hours. Cardiac Enzymes: No results for input(s): CKTOTAL, CKMB, CKMBINDEX, TROPONINI in the last 168 hours. BNP (last 3 results) No results for input(s): PROBNP in the last 8760 hours. HbA1C: No results for input(s): HGBA1C in the last 72 hours. CBG: No results for input(s): GLUCAP in the last 168 hours. Lipid Profile: No results for input(s): CHOL, HDL, LDLCALC, TRIG, CHOLHDL, LDLDIRECT in the last 72 hours. Thyroid Function Tests: No results for input(s): TSH, T4TOTAL, FREET4, T3FREE, THYROIDAB in the last 72 hours. Anemia Panel: No results for input(s): VITAMINB12, FOLATE, FERRITIN, TIBC, IRON, RETICCTPCT in the last 72 hours. Sepsis Labs: No results for input(s): PROCALCITON, LATICACIDVEN in the last 168 hours.  Recent Results (from the past 240 hour(s))  Blood Culture (routine x 2)     Status: None   Collection Time: 06/25/18  4:57 PM  Result Value Ref Range Status   Specimen Description   Final    BLOOD LEFT ANTECUBITAL Performed at [facility] [address], [place], [place] [address]    Special Requests   Final    BOTTLES DRAWN AEROBIC AND ANAEROBIC Blood Culture adequate volume Performed at [facility] [address] [place], [place] [address]    Culture   Final    NO GROWTH 5 DAYS Performed at [facility], [place] [address], [place], [place] [address]    Report Status 06/30/2018 FINAL  Final  Urine culture     Status: Abnormal   Collection Time: 06/25/18  4:58 PM  Result Value Ref Range Status   Specimen Description   Final    URINE, RANDOM Performed at [facility] [address], [place], [place] [address]    Special Requests   Final    NONE Performed at [facility], [place] [address], [place], [place]  [address]    Culture 60,000 COLONIES/mL ESCHERICHIA COLI (A)  Final   Report Status 06/28/2018 FINAL  Final   Organism ID, Bacteria ESCHERICHIA COLI (A)  Final      Susceptibility   Escherichia coli - MIC*    AMPICILLIN >=32 RESISTANT Resistant     CEFAZOLIN <=4 SENSITIVE Sensitive     CEFTRIAXONE <=1 SENSITIVE Sensitive     CIPROFLOXACIN <=0.25 SENSITIVE Sensitive     GENTAMICIN >=16 RESISTANT Resistant     IMIPENEM <=0.25 SENSITIVE Sensitive     NITROFURANTOIN <=16 SENSITIVE Sensitive     TRIMETH/SULFA >=320 RESISTANT Resistant     AMPICILLIN/SULBACTAM >=32 RESISTANT Resistant     PIP/TAZO <=4 SENSITIVE Sensitive     Extended ESBL NEGATIVE Sensitive     * 60,000 COLONIES/mL ESCHERICHIA COLI  Blood Culture (routine x 2)     Status: None   Collection Time: 06/25/18  7:45 PM  Result Value Ref Range Status   Specimen Description   Final    BLOOD LEFT HAND Performed at [facility] [address], [place], [place] [address]    Special Requests   Final    BOTTLES DRAWN AEROBIC ONLY Blood Culture adequate volume Performed at [facility] [address] [place], [place] [address]    Culture   Final    NO GROWTH 5 DAYS Performed at [facility], [place] [address] [place], [place] [address]    Report Status 06/30/2018 FINAL  Final     Radiology Studies: Ct Abdomen Pelvis Wo Contrast  Result Date: 07/03/2018 CLINICAL DATA:  Acute onset of worsening generalized abdominal pain. Bright red blood in stool. Status post recent colonoscopy. EXAM: CT ABDOMEN AND PELVIS WITHOUT CONTRAST TECHNIQUE: Multidetector CT imaging of the abdomen and pelvis was performed following the standard protocol without IV contrast. COMPARISON:  CT of the abdomen and pelvis from 06/25/2018 FINDINGS: Lower chest: There is a slightly unusual appearance to pleural fluid adjacent to the descending thoracic aorta, but this is thought to reflect a trace pleural effusion and  associated atelectasis. Trace bilateral pleural effusions are noted. Scattered coronary artery calcifications are seen. Hepatobiliary: A nonspecific 1.1 cm hypodensity is noted at the hepatic dome. The liver is otherwise unremarkable. The gallbladder is unremarkable in appearance. The common bile duct remains normal in caliber. A large amount of free air is noted tracking about the liver. Pancreas: The pancreas is within normal limits. Spleen: The spleen  is diminutive, with scattered calcified granulomata. Adrenals/Urinary Tract: The adrenal glands are unremarkable in appearance. A small parenchymal calcification is noted at the right kidney. The kidneys are otherwise unremarkable. There is no evidence of hydronephrosis. No renal or ureteral stones are identified. No perinephric stranding is seen. Stomach/Bowel: The stomach is unremarkable in appearance. The small bowel is within normal limits. The appendix is not visualized; there is no evidence for appendicitis. A large amount of free air is noted filling the abdomen, concerning for bowel perforation. A potential site of bowel perforation is at the proximal descending colon, given the pattern of air in this region. The colon is otherwise largely filled with stool. Persistent wall thickening along the mid sigmoid colon is concerning for colitis. Underlying diverticulosis is noted along the sigmoid colon. Vascular/Lymphatic: Scattered calcification is seen along the abdominal aorta and its branches. The abdominal aorta is otherwise grossly unremarkable. The inferior vena cava is grossly unremarkable. No retroperitoneal lymphadenopathy is seen. No pelvic sidewall lymphadenopathy is identified. Reproductive: The bladder is mildly distended and grossly unremarkable. The patient is status post hysterectomy. No suspicious adnexal masses are seen. Other: Soft tissue edema is noted along the lateral abdominal wall and about the pelvis. Musculoskeletal: No acute osseous  abnormalities are identified. The visualized musculature is unremarkable in appearance. IMPRESSION: 1. Large amount of free air noted filling the abdomen, concerning for bowel perforation. Question site of bowel perforation at the proximal descending colon, given the pattern of air in this region. 2. Persistent wall thickening along the mid sigmoid colon is concerning for colitis. Underlying diverticulosis along the sigmoid colon. 3. Somewhat unusual appearance to pleural fluid adjacent to the descending thoracic aorta, but this is thought to reflect a trace pleural effusion and associated atelectasis. Trace bilateral pleural effusions noted. 4. Scattered coronary artery calcifications seen. 5. Nonspecific 1.1 cm hypodensity at the hepatic dome; this may reflect a small cyst. 6. Soft tissue edema along the lateral abdominal wall and about the pelvis. Aortic Atherosclerosis (ICD10-I70.0). Critical Value/emergent results were called by telephone at the time of interpretation on 07/02/2018 at 11:32 pm to the Nursing team at [facility], who verbally acknowledged these results. Electronically Signed   By: Garald Balding M.D.   On: 07/03/2018 00:12   [place] Ekg Site Rite  Result Date: 07/03/2018 If Site Rite image not attached, placement could not be confirmed due to current cardiac rhythm.   Scheduled Meds: . methylPREDNISolone (SOLU-MEDROL) injection  40 mg Intravenous Daily   Continuous Infusions: . dextrose 5 % and 0.45% NaCl 75 mL/hr at 07/03/18 0608  . piperacillin-tazobactam (ZOSYN)  IV    . sodium chloride       LOS: 8 days   Flora Lipps, MD [facility]  If 7PM-7AM, please contact night-coverage [email or web address] Password [email or web address] 07/03/2018, 9:02 AM

## 2018-07-03 NOTE — Transfer of Care (Signed)
Immediate Anesthesia Transfer of Care Note  Patient: Felicia Carlson  Procedure(s) Performed: OPEN [name] PROCEDURE (N/A )  Patient Location: PACU and ICU  Anesthesia Type:General  Level of Consciousness: sedated and Patient remains intubated per anesthesia plan  Airway & Oxygen Therapy: Patient remains intubated per anesthesia plan  Post-op Assessment: Report given to RN and Post -op Vital signs reviewed and stable  Post vital signs: Reviewed and stable  Last Vitals:  Vitals Value Taken Time  BP    Temp    Pulse 73 07/03/2018  6:56 PM  Resp 12 07/03/2018  6:57 PM  SpO2 100 % 07/03/2018  6:56 PM  Vitals shown include unvalidated device data.  Last Pain:  Vitals:   07/03/18 1400  TempSrc: Oral  PainSc: 9       Patients Stated Pain Goal: 4 (64/84/72 0721)  Complications: No apparent anesthesia complications

## 2018-07-03 NOTE — Care Management Important Message (Signed)
Important Message  Patient Details  Name: Felicia Carlson MRN: [id number] Date of Birth: 1925/07/10   Medicare Important Message Given:  Yes    Kerin Salen 07/03/2018, 11:54 AMImportant Message  Patient Details  Name: Felicia Carlson MRN: [id number] Date of Birth: 13-Feb-1925   Medicare Important Message Given:  Yes    Kerin Salen 07/03/2018, 11:54 AM

## 2018-07-03 NOTE — Anesthesia Preprocedure Evaluation (Addendum)
Anesthesia Evaluation  Patient identified by MRN, date of birth, ID band Patient awake    Reviewed: Allergy & Precautions, NPO status , Patient's Chart, lab work & pertinent test results  Airway Mallampati: III  TM Distance: >3 FB Neck ROM: Full    Dental no notable dental hx. (+) Teeth Intact, Dental Advisory Given   Pulmonary neg pulmonary ROS,    Pulmonary exam normal breath sounds clear to auscultation       Cardiovascular hypertension, Pt. on medications and Pt. on home beta blockers Normal cardiovascular exam+ dysrhythmias (on xarelto, last dose 06/20/18) Atrial Fibrillation  Rhythm:Irregular Rate:Normal  - Left ventricle: The cavity size was normal. Wall thickness was increased in a pattern of mild LVH. Systolic function was vigorous. The estimated ejection fraction was in the range of 65% to 70%. Wall motion was normal; there were no regional wall motion abnormalities. Doppler parameters are consistent with abnormal left ventricular relaxation (grade 1 diastolic dysfunction). - Aortic valve: There was mild regurgitation. Valve area (VTI): 2.62 cm^2. Valve area (Vmax): 2.38 cm^2. Valve area (Vmean): 2.55 cm^2. - Pulmonary arteries: Systolic pressure was moderately increased. PA peak pressure: 46 mm Hg (S).   Neuro/Psych negative neurological ROS  negative psych ROS   GI/Hepatic Neg liver ROS, PUD, GERD  ,  Endo/Other  negative endocrine ROS  Renal/GU CRFRenal disease  negative genitourinary   Musculoskeletal  (+) Arthritis , Osteoarthritis,    Abdominal   Peds  Hematology  (+) Blood dyscrasia (Hgb 8.9), anemia ,   Anesthesia Other Findings Ulcerative colitis currently on prednisone taper now with perforated colon  Reproductive/Obstetrics                           Anesthesia Physical Anesthesia Plan  ASA: IV  Anesthesia Plan: General   Post-op Pain Management:    Induction:  Intravenous, Cricoid pressure planned and Rapid sequence  PONV Risk Score and Plan: 3 and Treatment may vary due to age or medical condition and Ondansetron  Airway Management Planned: Oral ETT  Additional Equipment:   Intra-op Plan:   Post-operative Plan: Extubation in OR and Possible Post-op intubation/ventilation  Informed Consent: I have reviewed the patients History and Physical, chart, labs and discussed the procedure including the risks, benefits and alternatives for the proposed anesthesia with the patient or authorized representative who has indicated his/her understanding and acceptance.   Dental advisory given  Plan Discussed with: CRNA  Anesthesia Plan Comments: (Discussed DNR with patient and daughter who is patient's POA. Plan to suspend DNR perioperatively. )      Anesthesia Quick Evaluation

## 2018-07-03 NOTE — Progress Notes (Signed)
Patient ID: Felicia Carlson, female   DOB: 1924/10/04, 84 y.o.   MRN: [id number] I saw her again today. She had some tachycardia and a decreased oxygen saturation.  Her abdomen is much more tender and she states it hurts a lot more. I think she needs elap with colectomy/colostomy. There is not another choice at this point. We discussed risks which are extensive even including death.  She and her daughter were both present and will proceed soon

## 2018-07-03 NOTE — Progress Notes (Signed)
Peripherally Inserted Central Catheter/Midline Placement  The IV Nurse has discussed with the patient and/or persons authorized to consent for the patient, the purpose of this procedure and the potential benefits and risks involved with this procedure.  The benefits include less needle sticks, lab draws from the catheter, and the patient may be discharged home with the catheter. Risks include, but not limited to, infection, bleeding, blood clot (thrombus formation), and puncture of an artery; nerve damage and irregular heartbeat and possibility to perform a PICC exchange if needed/ordered by physician.  Alternatives to this procedure were also discussed.  Bard Power PICC patient education guide, fact sheet on infection prevention and patient information card has been provided to patient /or left at bedside.    PICC/Midline Placement Documentation  PICC Single Lumen 07/03/18 PICC Left Brachial 38 cm 0 cm (Active)  Indication for Insertion or Continuance of Line Prolonged intravenous therapies 07/03/2018 10:35 AM  Exposed Catheter (cm) 0 cm 07/03/2018 10:35 AM  Site Assessment Clean;Dry;Intact 07/03/2018 10:35 AM  Line Status Flushed;Saline locked;Blood return noted 07/03/2018 10:35 AM  Dressing Type Transparent;Securing device 07/03/2018 10:35 AM  Dressing Change Due 07/10/18 07/03/2018 10:35 AM   PICC placed at the bedside by [facility] RN    Frances Maywood 07/03/2018, 10:39 AM

## 2018-07-03 NOTE — Progress Notes (Signed)
Spoke with Dr. Cristina Gong. Gave him Dr. Bettina Gavia pager number as he requested. Awaiting further orders.

## 2018-07-03 NOTE — Progress Notes (Signed)
Patient ID: Felicia Carlson, female   DOB: [birth year], 84 y.o.   MRN: [id number] She is awake, alert Her abdomen is mildly distended, she is tender suprapubic region, there is no peritonitis On immunosuppression and xarelto (took yesterday) The standard treatment of this is surgery. She is not toxic although she is on steroids. There is no family available to discuss. She lived alone prior to this and was functional.  Just put on abx.   We discussed the option of observation right now. I dont think there is any role for aspiration of free air.  I will try to discuss with family later today and reexamine. If she is not better tomorrow or worsens then she needs surgery.

## 2018-07-03 NOTE — Progress Notes (Signed)
(  Late entry from 1 AM--CHL was down until 5 AM this morning)  I was notified around midnight last night of results of last night's CTA (performed b/o increased abd pn) which showed large amount of FREE AIR in upper abd which was not present on CT exactly 1 wk earlier.  CT findings reviewed over the phone w/ radiologist:  No obvious source of perf---no extravasation of oral contrast, which made it to the mid-colon, no evident diverticulitis (altho plentiful diverticulosis), no severe colitis or pneumatosis or toxic megacolon.  Pt evaluated:  She states her pain is slightly BETTER than it was 12 hrs earlier.  Slight uncomfortable (after Percocet), afebrile, NAD, VSS.  Awake and alert, but slightly confused (eg, when asked if she was open to the idea of having surgery, she stated she was confident in her breast surgery . . .).  Chest clr, heart w/out murmur or tachycardia but w/ slight irregularity.  Abd moderately DISTENDED, bowel sounds quiet, but only mild subjective tenderness to palpation--no rigidity, guarding, or rebound.  Updated labs from last night were unchanged:  Nl WBC, CO2, renal fn.  Consulted Dr. August Albino Surg will see pt today.  Discussed case at length.  Our impression is that:  1.  the free air is of indeterminate origin and duration (no clear sentinel event where pt suddenly developed abd pain).  Night nurse last night Teressa Senter) has had pt for most of the past week and her observation is that pt's pain, if anything, seems to have been DECREASING during that time and there's been no obvious worsening of pt's condition.  2. The absence of abd findings or pt distress may be spurious due to pt's age and steroids  3.  There does not appear to be a need to take pt emergently to OR   PLAN:  1. NPO, maintenance IVF 2. Zosyn per Pharmacy 3. Stop Xarelto, have ordered SCD's 4. Re-establish IV, PICC line when possible due to tenuous peripheral access 5. Change steroids to  IV 6. D/C non-essential po meds such as probiotics and Vit D  7. D/C mesalamine for now (both oral and enema) since it seems to be doing little for pt's colitis and to avoid placing medication into intestine given possibility of ongoing perf 8. Change pain meds to IV (per Hospitalist) 9. Consider possible IR aspiration of free air 10.  Have spoken w/ pt's dtr Mateo Flow about possible need for surgery in near future--she and her sister discussed it and are open to it, if needed, even though pt is 84 years old and DNR  17.  Daily re-evaluation to consider surgery (exploration/repair of perf possibly with colectomy given medically refractory disease)--pt won't be a candidate for anti-TNF for a while in view of presumed intra-abd infection given documented free air  Time spent w/ pt, discussing w/ family and surgeon and hospitalist mid-level coverage, reviewing labs and CT, and coordinating care w/ nursing staff was approximately 2 hours.  Cleotis Nipper, M.D. Pager [phone] If no answer or after 5 PM call [phone]

## 2018-07-03 NOTE — Consult Note (Addendum)
Initial Pulmonary/Critical Care Consultation  Patient Name: Felicia Carlson MRN: [id number] DOB: March 06, 1925    ADMISSION DATE:  06/25/2018 CONSULTATION DATE: 07/03/2018  REFERRING MD: Rolm Bookbinder, MD  REASON FOR CONSULTATION: Post anesthesia care; endotracheally intubated   HISTORY OF PRESENT ILLNESS  This 84 y.o. African-American female is seen in consultation at the request of Dr. Rolm Bookbinder for recommendations on further evaluation and management of postanesthesia care and mechanical ventilatory support.  The patient has been transferred to the ICU after undergoing left colectomy; end-transverse colostomy; and takedown of splenic flexure.  She returns from the operating theater with endotracheal tube in situ.  At the time of clinical examination, the patient's abdominal incision site is packed and bandaged, although the bandages are pulling off freely.  Blake drain is draining serosanguineous fluid.  No other sinister drainage is noted.  Patient was originally admitted on 06/25/2018 after recently being discharged on 06/20/2018 when she had undergone treatment for flareup of ulcerative colitis.  Subsequent to her hospital discharge, she experienced crampy abdominal pain and mucus hematochezia.  She also reported anorexia and rapid weight loss (10 pounds in the last 2 weeks).  She was started on treatment with mesalamine and steroids but failed to improve.  CT on 07/02/2018 revealed pneumoperitoneum.  This patient is critically ill and cannot provide additional history due to Unconsciousness, Ventilated and Unable to speak.   PAST MEDICAL/SURGICAL/SOCIAL/FAMILY HISTORIES   Past Medical History:  Diagnosis Date  . Arthritis   . Cancer [facility]) 2007   breast cancer right side  . Chest pain   . Chronic kidney disease    stent in kidney right side  . Dyspnea   . GERD (gastroesophageal reflux disease)    sometimes  . Headache    shingles right side of head and right eye  .  Hypertension   . Personal history of chemotherapy   . Personal history of radiation therapy     Past Surgical History:  Procedure Laterality Date  . ABDOMINAL HYSTERECTOMY    . APPENDECTOMY    . BIOPSY  06/26/2018   Procedure: BIOPSY;  Surgeon: Ronnette Juniper, MD;  Location: [facility];  Service: Gastroenterology;;  . BREAST LUMPECTOMY Right 2007  . BREAST SURGERY Right 2011   lumpectomy  . EYE SURGERY Bilateral 2015   cataracts  . FLEXIBLE SIGMOIDOSCOPY N/A 06/26/2018   Procedure: FLEXIBLE SIGMOIDOSCOPY;  Surgeon: Ronnette Juniper, MD;  Location: [facility];  Service: Gastroenterology;  Laterality: N/A;  . TONSILLECTOMY     as a child  . TOTAL KNEE ARTHROPLASTY Right 10/26/2014   Procedure: RIGHT TOTAL KNEE ARTHROPLASTY;  Surgeon: [name], MD;  Location: [facility];  Service: Orthopedics;  Laterality: Right;    Social History   Tobacco Use  . Smoking status: Never Smoker  . Smokeless tobacco: Never Used  Substance Use Topics  . Alcohol use: Yes    Comment: wine occasional    Family History  Problem Relation Age of Onset  . Breast cancer Sister 64     Allergies  Allergen Reactions  . Isovue [Iopamidol] Hives and Rash    Pt  Had hives, rash and erythema her chest, face and neck.  Slight eye swelling.  Pt given 50 mg po benadryl.  Pt also developed severe abd pain during contrast injection and continued to get worse.  For those reasons the radiologist had the pt transported by EMS to [facility].  Pt needs full premeds in the future.  J Bohm  RTRCT  .  Tetanus Immune Globulin Swelling     Prior to Admission medications   Medication Sig Start Date End Date Taking? Authorizing Provider  amLODipine (NORVASC) 10 MG tablet Take 10 mg by mouth daily.   Yes [provider]  Cholecalciferol (VITAMIN D-1000 MAX ST) 1000 units tablet Take 1,000 Units by mouth daily.    Yes [provider]  furosemide (LASIX) 40 MG tablet Take 40 mg by mouth daily.   Yes [provider]  meloxicam (MOBIC) 15 MG tablet Take 15 mg by mouth daily.  05/21/18  Yes [provider]  mesalamine (LIALDA) 1.2 g EC tablet Take 2.4 g by mouth daily with breakfast.  06/06/18  Yes [provider]  metoprolol succinate (TOPROL-XL) 50 MG 24 hr tablet Take 3 tablets (150 mg total) by mouth daily. 06/21/18 07/21/18 Yes Purohit, Konrad Dolores, MD  predniSONE (DELTASONE) 10 MG tablet Take 4 tablets (40 mg total) by mouth daily with breakfast for 5 days, THEN 2 tablets (20 mg total) daily with breakfast for 5 days, THEN 1 tablet (10 mg total) daily with breakfast for 5 days, THEN 0.5 tablets (5 mg total) daily with breakfast for 14 days. 06/21/18 07/20/18 Yes Purohit, Konrad Dolores, MD  oxyCODONE-acetaminophen (ROXICET) 5-325 MG per tablet Take 1-2 tablets by mouth every 4 (four) hours as needed. Patient not taking: Reported on 06/25/2018 10/28/14   [name], MD  Rivaroxaban (XARELTO) 15 MG TABS tablet Take 1 tablet (15 mg total) by mouth daily with supper. Patient not taking: Reported on 06/25/2018 06/20/18   Cristy Folks, MD    Current Facility-Administered Medications  Medication Dose Route Frequency Provider Last Rate Last Dose  . 0.9 %  sodium chloride infusion   Intravenous Continuous Rolm Bookbinder, MD 75 mL/hr at 07/03/18 2031    . acetaminophen (OFIRMEV) IV 1,000 mg  1,000 mg Intravenous Q6H Rolm Bookbinder, MD      . Derrill Memo ON 07/04/2018] enoxaparin (LOVENOX) injection 40 mg  40 mg Subcutaneous Q24H Rolm Bookbinder, MD      . fentaNYL (SUBLIMAZE) injection 50 mcg  50 mcg Intravenous Q15 min PRN Rolm Bookbinder, MD      . fentaNYL (SUBLIMAZE) injection 50 mcg  50 mcg Intravenous Q2H PRN Rolm Bookbinder, MD      . fentaNYL 2560mcg in NS 253mL (51mcg/ml) infusion-PREMIX  0-400 mcg/hr Intravenous Continuous Rush Farmer, MD 2.5 mL/hr at 07/03/18 1920 25 mcg/hr at 07/03/18 1920  . metoprolol tartrate (LOPRESSOR) injection 2.5 mg  2.5 mg  Intravenous Q6H Rolm Bookbinder, MD   2.5 mg at 07/03/18 2047  . ondansetron (ZOFRAN-ODT) disintegrating tablet 4 mg  4 mg Oral Q6H PRN Rolm Bookbinder, MD       Or  . ondansetron [facility]) injection 4 mg  4 mg Intravenous Q6H PRN Rolm Bookbinder, MD      . pantoprazole (PROTONIX) injection 40 mg  40 mg Intravenous QHS Rolm Bookbinder, MD      . piperacillin-tazobactam (ZOSYN) IVPB 3.375 g  3.375 g Intravenous Jackie Plum, MD      . propofol (DIPRIVAN) 1000 MG/100ML infusion  0-50 mcg/kg/min Intravenous Continuous Rolm Bookbinder, MD 2.1 mL/hr at 07/03/18 2015 5 mcg/kg/min at 07/03/18 2015    VITAL SIGNS: BP (!) 99/15   Pulse (!) 171   Temp 98.2 F (36.8 C) (Axillary)   Resp 16   Ht 5\' 1"  (1.549 m)   Wt 70 kg   SpO2 100%   BMI 29.16 kg/m  VENTILATOR SETTINGS: Vent Mode: PRVC FiO2 (%):  [100 %] 100 % Set Rate:  [12 bmp-16 bmp] 16 bmp Vt Set:  [380 mL] 380 mL PEEP:  [5 cmH20] 5 cmH20 Plateau Pressure:  [17 cmH20] 17 cmH20  INTAKE / OUTPUT: I/O last 3 completed shifts: In: 1752.5 [P.O.:360; I.V.:1058.4; IV Piggyback:334.1] Out: 1402 [Urine:1301; Stool:1; Blood:100]  PHYSICAL EXAMINATION: GENERAL: Intubated. drowsy.  On propofol and fentanyl for sedation and postoperative pain control. HEAD: normocephalic, atraumatic EYE: PERRLA, EOM intact, no scleral icterus, no pallor. NOSE: nares are patent. No exudate.  THROAT/ORAL CAVITY:ETT in situ.  NECK: supple, no thyromegaly, no JVD, no lymphadenopathy. Trachea midline. CHEST/LUNG: symmetric in development and expansion. Good air entry. no crackles. No wheezes. HEART: Regular S1 and S2 without murmur, rub or gallop.  Tachycardic. ABDOMEN: Midline abdominal incision is noted to be open.  Bandage are spontaneously detaching.  Laparotomy incision is packed with gauze and demonstrates serosanguineous fluid.  No overt sinister drainage noted.  Blake drain contains serosanguineous fluid.  Colostomy is pink.  No  significant output.  Soft, nontender, nondistended.  Diminished bowel sounds. No rebound. No guarding. No hepatosplenomegaly. EXTREMITIES: Edema: 1+. No cyanosis.  No clubbing. 2+ DP pulses LYMPHATIC: no cervical/axillary/inguinal lymph nodes appreciated MUSCULOSKELETAL: no joint tenderness, deformity or swelling. SKIN: No rash or lesion. NEUROLOGIC: Currently on sedation with propofol and fentanyl.  Doll's eyes intact. Corneal reflex intact. Spontaneous respirations intact. Cranial nerves II-XII are grossly symmetric and physiologic. Babinski absent. No sensory deficit. Motor: 5/5 @ RUE, 5/5 @ LUE, 5/5 @ RLL,  5/5 @ LLL.  Gait was not assessed.   LABS:  BASIC METABOLIC PROFILE Recent Labs  Lab 06/30/18 0446 07/02/18 0348 07/03/18 0142  NA 137 135 132*  K 5.0 4.7 4.7  CL 105 100 96*  CO2 27 24 27   BUN 11 20 21   CREATININE 0.63 0.84 0.73  GLUCOSE 203* 245* 227*  CALCIUM 8.0* 8.6* 8.7*  MG 1.9  --   --     Liver Enzymes Recent Labs  Lab 06/30/18 0446 07/02/18 0348 07/03/18 0142  AST 16 14* 16  ALT 14 12 13   ALKPHOS 66 84 88  BILITOT 0.5 0.7 0.3  ALBUMIN 1.9* 2.0* 2.1*    CBC Recent Labs  Lab 06/30/18 0446 07/02/18 0348 07/03/18 0142  WBC 4.7 6.5 5.5  HGB 8.5* 8.9* 8.9*  HCT 25.2* 28.6* 28.6*  PLT 146* 158 154    CULTURES: Results for orders placed or performed during the hospital encounter of 06/25/18  Blood Culture (routine x 2)     Status: None   Collection Time: 06/25/18  4:57 PM  Result Value Ref Range Status   Specimen Description   Final    BLOOD LEFT ANTECUBITAL Performed at [facility], [place] [address] [place], [place] [address]    Special Requests   Final    BOTTLES DRAWN AEROBIC AND ANAEROBIC Blood Culture adequate volume Performed at [facility] [address], [place], [place] [address]    Culture   Final    NO GROWTH 5 DAYS Performed at [facility], [place] [address] [place], [place]  [address]    Report Status 06/30/2018 FINAL  Final  Urine culture     Status: Abnormal   Collection Time: 06/25/18  4:58 PM  Result Value Ref Range Status   Specimen Description   Final    URINE, RANDOM Performed at [facility] [address] [place], [place]  [address]    Special Requests   Final    NONE Performed at [facility], [place] [address], [place], [place] [address]    Culture 60,000 COLONIES/mL ESCHERICHIA COLI (A)  Final   Report Status 06/28/2018 FINAL  Final   Organism ID, Bacteria ESCHERICHIA COLI (A)  Final      Susceptibility   Escherichia coli - MIC*    AMPICILLIN >=32 RESISTANT Resistant     CEFAZOLIN <=4 SENSITIVE Sensitive     CEFTRIAXONE <=1 SENSITIVE Sensitive     CIPROFLOXACIN <=0.25 SENSITIVE Sensitive     GENTAMICIN >=16 RESISTANT Resistant     IMIPENEM <=0.25 SENSITIVE Sensitive     NITROFURANTOIN <=16 SENSITIVE Sensitive     TRIMETH/SULFA >=320 RESISTANT Resistant     AMPICILLIN/SULBACTAM >=32 RESISTANT Resistant     PIP/TAZO <=4 SENSITIVE Sensitive     Extended ESBL NEGATIVE Sensitive     * 60,000 COLONIES/mL ESCHERICHIA COLI  Blood Culture (routine x 2)     Status: None   Collection Time: 06/25/18  7:45 PM  Result Value Ref Range Status   Specimen Description   Final    BLOOD LEFT HAND Performed at [place] [address] [place], [place] [address]    Special Requests   Final    BOTTLES DRAWN AEROBIC ONLY Blood Culture adequate volume Performed at [place] [address] [place], [place] [address]    Culture   Final    NO GROWTH 5 DAYS Performed at [facility], [place] [address] [place], [place] [address]    Report Status 06/30/2018 FINAL  Final    IMAGING: Ct Abdomen Pelvis Wo Contrast  Result Date: 07/03/2018 CLINICAL DATA:  Acute onset of worsening generalized abdominal pain. Bright red blood in stool. Status post recent colonoscopy. EXAM: CT  ABDOMEN AND PELVIS WITHOUT CONTRAST TECHNIQUE: Multidetector CT imaging of the abdomen and pelvis was performed following the standard protocol without IV contrast. COMPARISON:  CT of the abdomen and pelvis from 06/25/2018 FINDINGS: Lower chest: There is a slightly unusual appearance to pleural fluid adjacent to the descending thoracic aorta, but this is thought to reflect a trace pleural effusion and associated atelectasis. Trace bilateral pleural effusions are noted. Scattered coronary artery calcifications are seen. Hepatobiliary: A nonspecific 1.1 cm hypodensity is noted at the hepatic dome. The liver is otherwise unremarkable. The gallbladder is unremarkable in appearance. The common bile duct remains normal in caliber. A large amount of free air is noted tracking about the liver. Pancreas: The pancreas is within normal limits. Spleen: The spleen is diminutive, with scattered calcified granulomata. Adrenals/Urinary Tract: The adrenal glands are unremarkable in appearance. A small parenchymal calcification is noted at the right kidney. The kidneys are otherwise unremarkable. There is no evidence of hydronephrosis. No renal or ureteral stones are identified. No perinephric stranding is seen. Stomach/Bowel: The stomach is unremarkable in appearance. The small bowel is within normal limits. The appendix is not visualized; there is no evidence for appendicitis. A large amount of free air is noted filling the abdomen, concerning for bowel perforation. A potential site of bowel perforation is at the proximal descending colon, given the pattern of air in this region. The colon is otherwise largely filled with stool. Persistent wall thickening along the mid sigmoid colon is concerning for colitis. Underlying diverticulosis is noted along the sigmoid colon. Vascular/Lymphatic: Scattered calcification is seen along the abdominal aorta and its branches. The abdominal aorta is otherwise  grossly unremarkable. The inferior  vena cava is grossly unremarkable. No retroperitoneal lymphadenopathy is seen. No pelvic sidewall lymphadenopathy is identified. Reproductive: The bladder is mildly distended and grossly unremarkable. The patient is status post hysterectomy. No suspicious adnexal masses are seen. Other: Soft tissue edema is noted along the lateral abdominal wall and about the pelvis. Musculoskeletal: No acute osseous abnormalities are identified. The visualized musculature is unremarkable in appearance. IMPRESSION: 1. Large amount of free air noted filling the abdomen, concerning for bowel perforation. Question site of bowel perforation at the proximal descending colon, given the pattern of air in this region. 2. Persistent wall thickening along the mid sigmoid colon is concerning for colitis. Underlying diverticulosis along the sigmoid colon. 3. Somewhat unusual appearance to pleural fluid adjacent to the descending thoracic aorta, but this is thought to reflect a trace pleural effusion and associated atelectasis. Trace bilateral pleural effusions noted. 4. Scattered coronary artery calcifications seen. 5. Nonspecific 1.1 cm hypodensity at the hepatic dome; this may reflect a small cyst. 6. Soft tissue edema along the lateral abdominal wall and about the pelvis. Aortic Atherosclerosis (ICD10-I70.0). Critical Value/emergent results were called by telephone at the time of interpretation on 07/02/2018 at 11:32 pm to the Nursing team at [facility], who verbally acknowledged these results. Electronically Signed   By: Garald Balding M.D.   On: 07/03/2018 00:12   [place] Ekg Site Rite  Result Date: 07/03/2018 If Site Rite image not attached, placement could not be confirmed due to current cardiac rhythm.   ANTIBIOTICS: Zosyn (11/14>>)  SIGNIFICANT EVENTS: 11/6: Admitted with crampy abdominal pain and mucus hematochezia after recent hospitalization for [place] of ulcerative colitis. 11/13: CT of the abdomen revealed  pneumoperitoneum. 11/14: Left colectomy, end transverse colostomy, takedown of splenic flexure  LINES/TUBES: L PICC (11/14>>) R IJ (11/14>>)  ASSESSMENT / PLAN: Principal Problem:   Pneumoperitoneum Active Problems:   Lower GI bleed   Ulcerative colitis with complication (HCC)   Abdominal cramping   Colitis, acute   Abdominal infection (HCC)   Constipation   Rectal bleeding   Hyperglycemia   Chronic atrial fibrillation   Chronic anticoagulation   PULMONARY  Endotracheally intubated This patient maintains an open laparotomy incision, we will maintain the patient's airway overnight. Propofol/fentanyl gtts for sedation. Vent settings will be titrated based on ABG results.  CARDIOVASCULAR  Hypertension Her home medications include: Norvasc 10 mg p.o. daily, Toprol-XL 150 mg p.o. daily in addition to oxycodone/acetaminophen 5/325 1 to 2 tablets p.o. every 4 hours as needed. Blood pressure will need to be managed with IV metoprolol (5 mg IV q 6 hr scheduled), labetalol PRN and diligent postoperative pain control.  RENAL  Mild hyponatremia, likely pseudohyponatremia secondary to hyperglycemia Monitor.  GASTROINTESTINAL  Pneumoperitoneum   Ulcerative colitis GI prophylaxis: Protonix Will defer to GI and SURGERY re: re-initiation of mesalamine/steroid therapy. Unknown whether or not this patient has any history of adrenal insufficiency (but, as she is hypertensive at this time, it does not appear to be an acute issue).  HEMATOLOGIC  Normocytic anemia, chronic Trend hemoglobin  INFECTIOUS  Recent E. coli urinary tract infection Continue Zosyn  ENDOCRINE  Hyperglycemia, likely secondary to steroids Check hemoglobin A1c  NEUROLOGIC  No acute issues   FAMILY  - Updates: Large family.  Update provided.  - Inter-disciplinary family meet or Palliative Care meeting due by: 07/09/2018   Renee Pain, MD Board Certified by the ABIM, [place] Pager: [phone])  [phone]  07/03/2018, 9:02 PM   Critical care time: 45 minutes.  The treatment and management of the patient's condition was required based on the threat of imminent deterioration. This time reflects time spent by the physician evaluating, providing care and managing the critically ill patient's care. The time was spent at the immediate bedside (or on the same floor/unit and dedicated to this patient's care). Time involved in separately billable procedures is NOT included int he critical care time indicated above. Family meeting and update time may be included above if and only if the patient is unable/incompetent to participate in clinical interview and/or decision making, and the discussion was necessary to determining treatment decisions.  Renee Pain, MD Board Certified by the ABIM, [place]

## 2018-07-03 NOTE — Progress Notes (Signed)
[facility] contacted. Dr. Cristina Gong paged. Waiting for call back.

## 2018-07-04 ENCOUNTER — Encounter (HOSPITAL_COMMUNITY): Payer: Self-pay | Admitting: General Surgery

## 2018-07-04 ENCOUNTER — Inpatient Hospital Stay (HOSPITAL_COMMUNITY): Payer: Medicare HMO

## 2018-07-04 DIAGNOSIS — I959 Hypotension, unspecified: Secondary | ICD-10-CM

## 2018-07-04 DIAGNOSIS — J9601 Acute respiratory failure with hypoxia: Secondary | ICD-10-CM

## 2018-07-04 DIAGNOSIS — A419 Sepsis, unspecified organism: Secondary | ICD-10-CM

## 2018-07-04 LAB — CBC
HCT: 24.9 % — ABNORMAL LOW (ref 36.0–46.0)
HEMATOCRIT: 24.1 % — AB (ref 36.0–46.0)
Hemoglobin: 7.8 g/dL — ABNORMAL LOW (ref 12.0–15.0)
Hemoglobin: 7.8 g/dL — ABNORMAL LOW (ref 12.0–15.0)
MCH: 30.8 pg (ref 26.0–34.0)
MCH: 29.3 pg (ref 26.0–34.0)
MCHC: 32.4 g/dL (ref 30.0–36.0)
MCHC: 31.3 g/dL (ref 30.0–36.0)
MCV: 95.3 fL (ref 80.0–100.0)
MCV: 93.6 fL (ref 80.0–100.0)
NRBC: 0 % (ref 0.0–0.2)
PLATELETS: 205 10*3/uL (ref 150–400)
Platelets: 185 10*3/uL (ref 150–400)
RBC: 2.53 MIL/uL — AB (ref 3.87–5.11)
RBC: 2.66 MIL/uL — AB (ref 3.87–5.11)
RDW: 15.9 % — ABNORMAL HIGH (ref 11.5–15.5)
RDW: 15.7 % — ABNORMAL HIGH (ref 11.5–15.5)
WBC: 9.2 10*3/uL (ref 4.0–10.5)
WBC: 10 10*3/uL (ref 4.0–10.5)
nRBC: 0.2 % (ref 0.0–0.2)

## 2018-07-04 LAB — BASIC METABOLIC PANEL
Anion gap: 8 (ref 5–15)
BUN: 16 mg/dL (ref 8–23)
CALCIUM: 7.6 mg/dL — AB (ref 8.9–10.3)
CHLORIDE: 98 mmol/L (ref 98–111)
CO2: 29 mmol/L (ref 22–32)
CREATININE: 0.69 mg/dL (ref 0.44–1.00)
GFR calc Af Amer: >60 mL/min (ref >60)
GFR calc non Af Amer: >60 mL/min (ref >60)
GLUCOSE: 222 mg/dL — AB (ref 70–99)
Potassium: 4.7 mmol/L (ref 3.5–5.1)
Sodium: 135 mmol/L (ref 135–145)

## 2018-07-04 LAB — BLOOD GAS, ARTERIAL
ACID-BASE EXCESS: 5.9 mmol/L — AB (ref 0.0–2.0)
Bicarbonate: 29.4 mmol/L — ABNORMAL HIGH (ref 20.0–28.0)
Drawn by: 225631
FIO2: 40
LHR: 16 {breaths}/min
O2 SAT: 97.1 %
PATIENT TEMPERATURE: 98.3
PCO2 ART: 39.1 mmHg (ref 32.0–48.0)
PEEP/CPAP: 5 cmH2O
PH ART: 7.489 — AB (ref 7.350–7.450)
PO2 ART: 114 mmHg — AB (ref 83.0–108.0)
VT: 0.38 mL

## 2018-07-04 NOTE — Progress Notes (Signed)
Patient is intubated and ventilated in the ICU under PCCM. Will sign off.

## 2018-07-04 NOTE — Anesthesia Postprocedure Evaluation (Signed)
Anesthesia Post Note  Patient: Felicia Carlson  Procedure(s) Performed: OPEN Felicia Carlson PROCEDURE (N/A )     Patient location during evaluation: ICU Anesthesia Type: General Level of consciousness: patient remains intubated per anesthesia plan Pain management: pain level controlled Vital Signs Assessment: post-procedure vital signs reviewed and stable Respiratory status: patient remains intubated per anesthesia plan and patient on ventilator - see flowsheet for VS Cardiovascular status: blood pressure returned to baseline Postop Assessment: no apparent nausea or vomiting Anesthetic complications: no    Last Vitals:  Vitals:   07/04/18 1700 07/04/18 1800  BP: (!) 136/96 (!) 138/55  Pulse: 98 70  Resp:    Temp:    SpO2: 99% 100%    Last Pain:  Vitals:   07/04/18 1612  TempSrc:   PainSc: Asleep   Pain Goal: Patients Stated Pain Goal: 4 (07/01/18 1400)               Felicia Carlson Felicia Carlson

## 2018-07-04 NOTE — Anesthesia Procedure Notes (Signed)
Central Venous Catheter Insertion Performed by: Freddrick March, MD, anesthesiologist Start/End11/14/2019 5:10 PM, 07/03/2018 5:30 PM Patient location: Pre-op. Preanesthetic checklist: patient identified, IV checked, site marked, risks and benefits discussed, surgical consent, monitors and equipment checked, pre-op evaluation, timeout performed and anesthesia consent Position: Trendelenburg Patient sedated Hand hygiene performed  and maximum sterile barriers used  Catheter size: 8 Fr Total catheter length 16. Central line was placed.Double lumen Procedure performed using ultrasound guided technique. Ultrasound Notes:anatomy identified, needle tip was noted to be adjacent to the nerve/plexus identified and no ultrasound evidence of intravascular and/or intraneural injection Attempts: 1 Following insertion, dressing applied, line sutured and Biopatch. Post procedure assessment: blood return through all ports, free fluid flow and no air  Patient tolerated the procedure well with no immediate complications.

## 2018-07-04 NOTE — Progress Notes (Signed)
Thanks to Dr. Donne Hazel for his work and our team will be on standby to help as needed and will ask the hospital team to check on Monday and consider 15 mg IV Solu-Medrol for a day or 2 and then may be 5 mg for a day or 2 for a quick weaning process to possibly decrease the likelihood of adrenal issues from possibly weaning her steroids too quickly although I agree with surgery's concerns regarding their use

## 2018-07-04 NOTE — Progress Notes (Signed)
Nutrition Follow-up  DOCUMENTATION CODES:   Not applicable  INTERVENTION:  - Recommend initiation of TF 11/16: Osmolite 1.2 @ 20 mL/hr to advance as tolerated/per Surgery. - Recommended goal rate: Osmolite 1.2 @ 60 mL/hr which will provide 1728 kcal, 80 grams of protein, and 1181 mL free water.   NUTRITION DIAGNOSIS:   Inadequate oral intake related to decreased appetite as evidenced by per patient/family report. -ongoing, NPO status  GOAL:   Patient will meet greater than or equal to 90% of their needs -unmet  MONITOR:   Weight trends, Labs, Skin, I & O's, Other (Comment)(TF initiation)  ASSESSMENT:   Patient with PMH significant for ulcerative colitis, CKD, HTN, and breast cancer s/p lumpectomy. Recently discharged from [facility] on 11/1 for UC flare and prescribed steroids. Presents this admission with abdominal cramping and constipation.   Weight has been stable. Patient is POD #1 L colectomy with colostomy placement; increased estimated needs d/t this. She was extubated earlier this AM. NGT in place and is currently clamped. Reviewed Dr. Cristal Generous note from this AM which states possible ability to start trickle TF via NGT 11/16. Note also states "TPN might be reasonable to start early as she has been ill for some time, could see how she does for the next 48 hours."  RD recommends initiation of TF and if patient is tolerating, to advance TF rate and not to initiate TPN per ASPEN guidelines to utilize and maintain gut integrity.    Medications reviewed; 40 mg IV Protonix/day. Labs reviewed; Ca: 7.6 mg/dL. IVF; NS @ 75 mL/hr.   Diet Order:   Diet Order            Diet NPO time specified  Diet effective now              EDUCATION NEEDS:   Not appropriate for education at this time  Skin:  Skin Assessment: Skin Integrity Issues: Skin Integrity Issues:: Incisions Incisions: abdominal (11/14)  Last BM:  11/14  Height:   Ht Readings from Last 1 Encounters:   07/03/18 5\' 1"  (1.549 m)    Weight:   Wt Readings from Last 1 Encounters:  07/04/18 70.3 kg    Ideal Body Weight:  47.7 kg  BMI:  Body mass index is 29.28 kg/m.  Estimated Nutritional Needs:   Kcal:  1650-1850  Protein:  80-90 grams  Fluid:  >/= 1.7 L/day     Jarome Matin, MS, RD, LDN, [facility] Inpatient Clinical Dietitian Pager # [phone] After hours/weekend pager # [phone]

## 2018-07-04 NOTE — Consult Note (Signed)
[place] Nurse ostomy consult note Pt had colostomy surgery performed on 11/14.  Surgical team following for assessment and plan of care for abd wound. Stoma type/location: Stoma is red and viable, flush with skin level, current pouch is leaking behind the barrier.   Stomal assessment/size: 2 1/4 inches, oval  Peristomal assessment: intact skin surrounding Treatment options for stomal/peristomal skin: Applied barrier ring to attempt to maintain a seal Output: No stool or flatus, small amt pink drainage Ostomy pouching: 2pc.  Education provided:  Pt is intubated and no family members are at the bedside during pouch change.  Educational materials left in the room along with extra supplies for staff nurses use.  [facility] team will continue to follow for teaching sessions when pt is stable and out of ICU. Enrolled patient in [place] program: No Julien Girt MSN, [place], [place], [place], [place]

## 2018-07-04 NOTE — Consult Note (Signed)
Initial Pulmonary/Critical Care Consultation  Patient Name: Felicia Carlson MRN: [id number] DOB: 1924/08/29    ADMISSION DATE:  06/25/2018 CONSULTATION DATE: 07/03/2018  REFERRING MD: Rolm Bookbinder, MD  REASON FOR CONSULTATION: Post anesthesia care; endotracheally intubated   HISTORY OF PRESENT ILLNESS  This 84 y.o. African-American female is seen in consultation at the request of Dr. Rolm Bookbinder for recommendations on further evaluation and management of postanesthesia care and mechanical ventilatory support.  The patient has been transferred to the ICU after undergoing left colectomy; end-transverse colostomy; and takedown of splenic flexure.  She returns from the operating theater with endotracheal tube in situ.  At the time of clinical examination, the patient's abdominal incision site is packed and bandaged, although the bandages are pulling off freely.  Blake drain is draining serosanguineous fluid.  No other sinister drainage is noted.  Patient was originally admitted on 06/25/2018 after recently being discharged on 06/20/2018 when she had undergone treatment for flareup of ulcerative colitis.  Subsequent to her hospital discharge, she experienced crampy abdominal pain and mucus hematochezia.  She also reported anorexia and rapid weight loss (10 pounds in the last 2 weeks).  She was started on treatment with mesalamine and steroids but failed to improve.  CT on 07/02/2018 revealed pneumoperitoneum.  This patient is critically ill and cannot provide additional history due to Unconsciousness, Ventilated and Unable to speak.  VITAL SIGNS: BP (!) 99/55 (BP Location: Left Wrist)   Pulse 70   Temp 98.4 F (36.9 C) (Axillary)   Resp 16   Ht 5\' 1"  (1.549 m)   Wt 70.3 kg   SpO2 95%   BMI 29.28 kg/m   VENTILATOR SETTINGS: Vent Mode: PSV FiO2 (%):  [40 %-100 %] 40 % Set Rate:  [12 bmp-16 bmp] 16 bmp Vt Set:  [380 mL] 380 mL PEEP:  [5 cmH20] 5 cmH20 Pressure Support:  [5 cmH20] 5  cmH20 Plateau Pressure:  [9 cmH20-17 cmH20] 9 cmH20  INTAKE / OUTPUT: I/O last 3 completed shifts: In: 2791.6 [P.O.:120; I.V.:2106.3; IV Piggyback:565.3] Out: 9381 [Urine:1511; Drains:200; Stool:1; Blood:100]  PHYSICAL EXAMINATION: General: acutely ill appearing female, obvious painful distress HEENT: [place]/AT, PERRL, EOM-I and MMM Heart: RRR, Nl S1/S2 and -M/R/G Lung: CTA bilaterally Abdomen: Diffusely tender, soft, ND and +BS Ext: -edema and -tenderness Neuro: moving all ext to command Skin: incision noted  LABS:  BASIC METABOLIC PROFILE Recent Labs  Lab 06/30/18 0446 07/02/18 0348 07/03/18 0142 07/04/18 0349  NA 137 135 132* 135  K 5.0 4.7 4.7 4.7  CL 105 100 96* 98  CO2 27 24 27 29   BUN 11 20 21 16   CREATININE 0.63 0.84 0.73 0.69  GLUCOSE 203* 245* 227* 222*  CALCIUM 8.0* 8.6* 8.7* 7.6*  MG 1.9  --   --   --     Liver Enzymes Recent Labs  Lab 06/30/18 0446 07/02/18 0348 07/03/18 0142  AST 16 14* 16  ALT 14 12 13   ALKPHOS 66 84 88  BILITOT 0.5 0.7 0.3  ALBUMIN 1.9* 2.0* 2.1*    CBC Recent Labs  Lab 07/02/18 0348 07/03/18 0142 07/04/18 0349  WBC 6.5 5.5 9.2  HGB 8.9* 8.9* 7.8*  HCT 28.6* 28.6* 24.1*  PLT 158 154 185    CULTURES: Results for orders placed or performed during the hospital encounter of 06/25/18  Blood Culture (routine x 2)     Status: None   Collection Time: 06/25/18  4:57 PM  Result Value Ref Range Status   Specimen  Description   Final    BLOOD LEFT ANTECUBITAL Performed at [facility] [address], [place], [place] [address]    Special Requests   Final    BOTTLES DRAWN AEROBIC AND ANAEROBIC Blood Culture adequate volume Performed at [facility] [address] [place], [place] [address]    Culture   Final    NO GROWTH 5 DAYS Performed at [facility], [place] [address] [place], [place] [address]    Report Status 06/30/2018 FINAL  Final  Urine culture     Status: Abnormal    Collection Time: 06/25/18  4:58 PM  Result Value Ref Range Status   Specimen Description   Final    URINE, RANDOM Performed at [facility] [address] [place], [place] [address]    Special Requests   Final    NONE Performed at [facility], [place] [address] [place], [place] [address]    Culture 60,000 COLONIES/mL ESCHERICHIA COLI (A)  Final   Report Status 06/28/2018 FINAL  Final   Organism ID, Bacteria ESCHERICHIA COLI (A)  Final      Susceptibility   Escherichia coli - MIC*    AMPICILLIN >=32 RESISTANT Resistant     CEFAZOLIN <=4 SENSITIVE Sensitive     CEFTRIAXONE <=1 SENSITIVE Sensitive     CIPROFLOXACIN <=0.25 SENSITIVE Sensitive     GENTAMICIN >=16 RESISTANT Resistant     IMIPENEM <=0.25 SENSITIVE Sensitive     NITROFURANTOIN <=16 SENSITIVE Sensitive     TRIMETH/SULFA >=320 RESISTANT Resistant     AMPICILLIN/SULBACTAM >=32 RESISTANT Resistant     PIP/TAZO <=4 SENSITIVE Sensitive     Extended ESBL NEGATIVE Sensitive     * 60,000 COLONIES/mL ESCHERICHIA COLI  Blood Culture (routine x 2)     Status: None   Collection Time: 06/25/18  7:45 PM  Result Value Ref Range Status   Specimen Description   Final    BLOOD LEFT HAND Performed at [facility] [address], [place], [place] [address]    Special Requests   Final    BOTTLES DRAWN AEROBIC ONLY Blood Culture adequate volume Performed at [facility] [address], [place], [place] [address]    Culture   Final    NO GROWTH 5 DAYS Performed at [facility], [place] [address], [place], [place] [address]    Report Status 06/30/2018 FINAL  Final  Culture, blood (single)     Status: None (Preliminary result)   Collection Time: 07/03/18  1:42 AM  Result Value Ref Range Status   Specimen Description   Final    BLOOD LEFT HAND Performed at [facility] [address], [place], [place] [address]    Special Requests    Final    BOTTLES DRAWN AEROBIC ONLY Blood Culture adequate volume Performed at [facility] [address], [place], [place] [address]    Culture   Final    NO GROWTH 1 DAY Performed at [facility], [place] [address] [place], [place] [address]    Report Status PENDING  Incomplete  MRSA PCR Screening     Status: None   Collection Time: 07/03/18  7:33 PM  Result Value Ref Range Status   MRSA by PCR NEGATIVE NEGATIVE Final    Comment:        The GeneXpert MRSA Assay (FDA approved for NASAL specimens only), is one component of  a comprehensive MRSA colonization surveillance program. It is not intended to diagnose MRSA infection nor to guide or monitor treatment for MRSA infections. Performed at [facility], [place] [address] [place], [place] [address]     IMAGING: Dg Chest Port 1 View  Result Date: 07/04/2018 CLINICAL DATA:  Hypoxia EXAM: PORTABLE CHEST 1 VIEW COMPARISON:  July 03, 2018 FINDINGS: Endotracheal tube tip is 4.3 cm above the carina. Nasogastric tube tip and side port are below the diaphragm. Central catheter tip from right jugular approach is in the superior vena cava near the cavoatrial junction. Left central catheter tip is in the superior vena cava. No pneumothorax. There is airspace consolidation in the left lower lobe with small left pleural effusion. There is a minimal right pleural effusion with right base atelectasis. Heart is upper normal in size with pulmonary vascularity normal. No adenopathy. There is aortic atherosclerosis. No bone lesions. IMPRESSION: Tube and catheter positions as described without pneumothorax. Airspace consolidation with effusion left lower lobe. Minimal right pleural effusion with right base atelectasis. Stable cardiac silhouette. There is aortic atherosclerosis. Aortic Atherosclerosis (ICD10-I70.0). Electronically Signed   By: Lowella Grip III M.D.   On: 07/04/2018 08:01   Dg Chest Port 1  View  Result Date: 07/03/2018 CLINICAL DATA:  Evaluate central line an ETT EXAM: PORTABLE CHEST 1 VIEW COMPARISON:  June 25, 2018 FINDINGS: A left PICC line terminates in the central SVC. An ET tube is in good position. An NG tube terminates below today's film. A right central line terminates in the central SVC. No pneumothorax. The cardiomediastinal silhouette is stable. Possible small right effusion with underlying atelectasis. No other abnormalities. IMPRESSION: Support apparatus as above. Possible small effusion and atelectasis in the right base. Electronically Signed   By: Dorise Bullion III M.D   On: 07/03/2018 22:28   [place] Ekg Site Rite  Result Date: 07/03/2018 If Site Rite image not attached, placement could not be confirmed due to current cardiac rhythm.   ANTIBIOTICS: Zosyn (11/14>>)  SIGNIFICANT EVENTS: 11/6: Admitted with crampy abdominal pain and mucus hematochezia after recent hospitalization for [place] of ulcerative colitis. 11/13: CT of the abdomen revealed pneumoperitoneum. 11/14: Left colectomy, end transverse colostomy, takedown of splenic flexure  LINES/TUBES: L PICC (11/14>>) R IJ (11/14>>)  ASSESSMENT / PLAN: Principal Problem:   Pneumoperitoneum Active Problems:   Lower GI bleed   Ulcerative colitis with complication (HCC)   Abdominal cramping   Colitis, acute   Abdominal infection (HCC)   Constipation   Rectal bleeding   Hyperglycemia   Chronic atrial fibrillation   Chronic anticoagulation  84 year old female with pneumoperitoneum post op that was left intubated.  Patient is visibly in a lot of pain on a low dose fentanyl drip.    Acute respiratory failure:  - Extubate today  - If fails post due to respiratory effort and pain will reintubate if another issue then will transition to comfort, family does not wish for a prolonged resuscitative process at this point.  Hypotension: likely sedation related  - Gentle volume  - Try to avoid pressors at  this point  Pneumoperitoneum:  - Post op care per CVTS  - NPO, start trickle feeds in AM  Sepsis:  - Zosyn  - F/u on culture  FAMILY  - Updates: Daughter and son in law updated bedside  - Inter-disciplinary family meet or Palliative Care meeting due by: 07/09/2018  The patient is critically ill with multiple organ systems failure and requires  high complexity decision making for assessment and support, frequent evaluation and titration of therapies, application of advanced monitoring technologies and extensive interpretation of multiple databases.   Critical Care Time devoted to patient care services described in this note is  32  Minutes. This time reflects time of care of this signee Dr Jennet Maduro. This critical care time does not reflect procedure time, or teaching time or supervisory time of PA/NP/Med student/Med Resident etc but could involve care discussion time.  Rush Farmer, M.D. [facility] Pulmonary/Critical Care Medicine. Pager: [phone]. After hours pager: [phone].

## 2018-07-04 NOTE — Progress Notes (Addendum)
1 Day Post-Op   Subjective/Chief Complaint: Sedated on vent   Objective: Vital signs in last 24 hours: Temp:  [98.2 F (36.8 C)-98.6 F (37 C)] 98.3 F (36.8 C) (11/15 0400) Pulse Rate:  [71-171] 92 (11/15 0600) Resp:  [12-18] 16 (11/15 0600) BP: (99-170)/(15-117) 120/81 (11/15 0600) SpO2:  [90 %-100 %] 100 % (11/15 0600) FiO2 (%):  [40 %-100 %] 40 % (11/15 0339) Weight:  [70 kg-70.3 kg] 70.3 kg (11/15 0400) Last BM Date: 07/03/18  Intake/Output from previous day: 11/14 0701 - 11/15 0700 In: 2520.1 [I.V.:2020.8; IV Piggyback:499.3] Out: 1012 [Urine:711; Drains:200; Stool:1; Blood:100] Intake/Output this shift: No intake/output data recorded.  Resp: coarse bilateral breath sounds Cardio: rrr GI: soft approp tender drain serosang stoma pink with some blood from around edges, wound dressed, no bs  Lab Results:  Recent Labs    07/03/18 0142 07/04/18 0349  WBC 5.5 9.2  HGB 8.9* 7.8*  HCT 28.6* 24.1*  PLT 154 185   BMET Recent Labs    07/03/18 0142 07/04/18 0349  NA 132* 135  K 4.7 4.7  CL 96* 98  CO2 27 29  GLUCOSE 227* 222*  BUN 21 16  CREATININE 0.73 0.69  CALCIUM 8.7* 7.6*   PT/INR No results for input(s): LABPROT, INR in the last 72 hours. ABG Recent Labs    07/03/18 2205 07/04/18 0440  PHART 7.450 7.489*  HCO3 28.0 29.4*    Studies/Results: Ct Abdomen Pelvis Wo Contrast  Result Date: 07/03/2018 CLINICAL DATA:  Acute onset of worsening generalized abdominal pain. Bright red blood in stool. Status post recent colonoscopy. EXAM: CT ABDOMEN AND PELVIS WITHOUT CONTRAST TECHNIQUE: Multidetector CT imaging of the abdomen and pelvis was performed following the standard protocol without IV contrast. COMPARISON:  CT of the abdomen and pelvis from 06/25/2018 FINDINGS: Lower chest: There is a slightly unusual appearance to pleural fluid adjacent to the descending thoracic aorta, but this is thought to reflect a trace pleural effusion and associated  atelectasis. Trace bilateral pleural effusions are noted. Scattered coronary artery calcifications are seen. Hepatobiliary: A nonspecific 1.1 cm hypodensity is noted at the hepatic dome. The liver is otherwise unremarkable. The gallbladder is unremarkable in appearance. The common bile duct remains normal in caliber. A large amount of free air is noted tracking about the liver. Pancreas: The pancreas is within normal limits. Spleen: The spleen is diminutive, with scattered calcified granulomata. Adrenals/Urinary Tract: The adrenal glands are unremarkable in appearance. A small parenchymal calcification is noted at the right kidney. The kidneys are otherwise unremarkable. There is no evidence of hydronephrosis. No renal or ureteral stones are identified. No perinephric stranding is seen. Stomach/Bowel: The stomach is unremarkable in appearance. The small bowel is within normal limits. The appendix is not visualized; there is no evidence for appendicitis. A large amount of free air is noted filling the abdomen, concerning for bowel perforation. A potential site of bowel perforation is at the proximal descending colon, given the pattern of air in this region. The colon is otherwise largely filled with stool. Persistent wall thickening along the mid sigmoid colon is concerning for colitis. Underlying diverticulosis is noted along the sigmoid colon. Vascular/Lymphatic: Scattered calcification is seen along the abdominal aorta and its branches. The abdominal aorta is otherwise grossly unremarkable. The inferior vena cava is grossly unremarkable. No retroperitoneal lymphadenopathy is seen. No pelvic sidewall lymphadenopathy is identified. Reproductive: The bladder is mildly distended and grossly unremarkable. The patient is status post hysterectomy. No suspicious adnexal masses are  seen. Other: Soft tissue edema is noted along the lateral abdominal wall and about the pelvis. Musculoskeletal: No acute osseous abnormalities  are identified. The visualized musculature is unremarkable in appearance. IMPRESSION: 1. Large amount of free air noted filling the abdomen, concerning for bowel perforation. Question site of bowel perforation at the proximal descending colon, given the pattern of air in this region. 2. Persistent wall thickening along the mid sigmoid colon is concerning for colitis. Underlying diverticulosis along the sigmoid colon. 3. Somewhat unusual appearance to pleural fluid adjacent to the descending thoracic aorta, but this is thought to reflect a trace pleural effusion and associated atelectasis. Trace bilateral pleural effusions noted. 4. Scattered coronary artery calcifications seen. 5. Nonspecific 1.1 cm hypodensity at the hepatic dome; this may reflect a small cyst. 6. Soft tissue edema along the lateral abdominal wall and about the pelvis. Aortic Atherosclerosis (ICD10-I70.0). Critical Value/emergent results were called by telephone at the time of interpretation on 07/02/2018 at 11:32 pm to the Nursing team at [facility], who verbally acknowledged these results. Electronically Signed   By: Garald Balding M.D.   On: 07/03/2018 00:12   Dg Chest Port 1 View  Result Date: 07/03/2018 CLINICAL DATA:  Evaluate central line an ETT EXAM: PORTABLE CHEST 1 VIEW COMPARISON:  June 25, 2018 FINDINGS: A left PICC line terminates in the central SVC. An ET tube is in good position. An NG tube terminates below today's film. A right central line terminates in the central SVC. No pneumothorax. The cardiomediastinal silhouette is stable. Possible small right effusion with underlying atelectasis. No other abnormalities. IMPRESSION: Support apparatus as above. Possible small effusion and atelectasis in the right base. Electronically Signed   By: Dorise Bullion III M.D   On: 07/03/2018 22:28   [place] Ekg Site Rite  Result Date: 07/03/2018 If Site Rite image not attached, placement could not be confirmed due to current cardiac  rhythm.   Anti-infectives: Anti-infectives (From admission, onward)   Start     Dose/Rate Route Frequency Ordered Stop   07/03/18 2200  piperacillin-tazobactam (ZOSYN) IVPB 3.375 g     3.375 g 12.5 mL/hr over 240 Minutes Intravenous Every 8 hours 07/03/18 2034     07/03/18 1200  piperacillin-tazobactam (ZOSYN) IVPB 3.375 g  Status:  Discontinued     3.375 g 12.5 mL/hr over 240 Minutes Intravenous Every 8 hours 07/03/18 0444 07/03/18 2012   07/03/18 0445  piperacillin-tazobactam (ZOSYN) IVPB 3.375 g     3.375 g 100 mL/hr over 30 Minutes Intravenous  Once 07/03/18 0440 07/03/18 0640   06/25/18 1600  cefTRIAXone (ROCEPHIN) 2 g in sodium chloride 0.9 % 100 mL IVPB  Status:  Discontinued     2 g 200 mL/hr over 30 Minutes Intravenous Every 24 hours 06/25/18 1546 06/27/18 1348   06/25/18 1600  metroNIDAZOLE (FLAGYL) IVPB 500 mg  Status:  Discontinued     500 mg 100 mL/hr over 60 Minutes Intravenous Every 8 hours 06/25/18 1546 06/27/18 1348      Assessment/Plan: POD 1 left colectomy/colostomy- Felicia Carlson -continue ng tube and foley -tpn might be reasonable to start early as she has been ill for some time, could see how she does for next 48 hours -can trickle feed via ng possibly tomorrow also -recheck hct later today, lower today and had been on xarelto -hold xarelto for now, lovenox for prophylaxis until hct stable then can consider heparin -would like to hold on steroids/immunosuppression for now- she is high risk for intraabdominal abscess  and rectal stump issues -extubate per ccm, no reason needs to remain intubated from my standpoint -path pending -woc consult -bid dressing changes  Rolm Bookbinder 07/04/2018

## 2018-07-05 ENCOUNTER — Inpatient Hospital Stay (HOSPITAL_COMMUNITY): Payer: Medicare HMO

## 2018-07-05 DIAGNOSIS — D62 Acute posthemorrhagic anemia: Secondary | ICD-10-CM | POA: Diagnosis not present

## 2018-07-05 DIAGNOSIS — J9601 Acute respiratory failure with hypoxia: Secondary | ICD-10-CM | POA: Diagnosis not present

## 2018-07-05 DIAGNOSIS — D649 Anemia, unspecified: Secondary | ICD-10-CM | POA: Diagnosis present

## 2018-07-05 LAB — GLUCOSE, CAPILLARY
GLUCOSE-CAPILLARY: 99 mg/dL (ref 70–99)
GLUCOSE-CAPILLARY: 87 mg/dL (ref 70–99)
Glucose-Capillary: 114 mg/dL — ABNORMAL HIGH (ref 70–99)
Glucose-Capillary: 80 mg/dL (ref 70–99)

## 2018-07-05 LAB — CBC
HEMATOCRIT: 23.3 % — AB (ref 36.0–46.0)
Hemoglobin: 7.4 g/dL — ABNORMAL LOW (ref 12.0–15.0)
MCH: 30.5 pg (ref 26.0–34.0)
MCHC: 31.8 g/dL (ref 30.0–36.0)
MCV: 95.9 fL (ref 80.0–100.0)
Platelets: 196 10*3/uL (ref 150–400)
RBC: 2.43 MIL/uL — ABNORMAL LOW (ref 3.87–5.11)
RDW: 16 % — ABNORMAL HIGH (ref 11.5–15.5)
WBC: 9.9 10*3/uL (ref 4.0–10.5)
nRBC: 0.2 % (ref 0.0–0.2)

## 2018-07-05 MED ORDER — MORPHINE SULFATE (PF) 2 MG/ML IV SOLN: 1.0000 mg | INTRAVENOUS | Status: DC | PRN

## 2018-07-05 MED ORDER — MORPHINE SULFATE (PF) 2 MG/ML IV SOLN
1.0000 mg | INTRAVENOUS | Status: DC | PRN
  Administered 2018-07-05: 1 mg via INTRAVENOUS
  Filled 2018-07-05: qty 1

## 2018-07-05 MED ORDER — MORPHINE SULFATE (PF) 2 MG/ML IV SOLN
1.0000 mg | INTRAVENOUS | Status: DC | PRN
  Administered 2018-07-05 – 2018-07-09 (×24): 2 mg via INTRAVENOUS
  Filled 2018-07-05 (×25): qty 1

## 2018-07-05 MED ORDER — INSULIN ASPART 100 UNIT/ML ~~LOC~~ SOLN
0.0000 [IU] | SUBCUTANEOUS | Status: DC
  Administered 2018-07-06 (×2): 1 [IU] via SUBCUTANEOUS
  Administered 2018-07-07 – 2018-07-09 (×11): 2 [IU] via SUBCUTANEOUS
  Administered 2018-07-09: 1 [IU] via SUBCUTANEOUS
  Administered 2018-07-09: 2 [IU] via SUBCUTANEOUS

## 2018-07-05 MED ORDER — HYDROMORPHONE HCL 1 MG/ML IJ SOLN: 0.5000 mg | INTRAMUSCULAR | Status: DC | PRN

## 2018-07-05 MED ORDER — MORPHINE SULFATE (PF) 2 MG/ML IV SOLN
1.0000 mg | Freq: Once | INTRAVENOUS | Status: AC
  Administered 2018-07-05: 1 mg via INTRAVENOUS

## 2018-07-05 MED ORDER — SODIUM CHLORIDE 0.9 % IV SOLN
INTRAVENOUS | Status: AC
  Administered 2018-07-05 – 2018-07-06 (×2): via INTRAVENOUS

## 2018-07-05 NOTE — Progress Notes (Signed)
Pt pulled out NG tube, remains confused. 5 F NG tube replaced to L nare. PT tolerated procedure poorly. External length of NG tube measuring 64 cm. Confirmed  Placement with air auscultation. Will order chest xray to confirm.

## 2018-07-05 NOTE — Progress Notes (Addendum)
PROGRESS NOTE  Felicia Carlson GXQ:[id number] DOB: 01/02/1925 DOA: 06/25/2018 PCP: Lajean Manes, MD  HPI/Brief Narrative  Felicia Carlson is a 84 y.o. year old female with medical history significant for ulcerative colitis, chronic A. fib on Xarelto who presented on 06/25/2018 with bright red blood per rectum, anorexia and abdominal pain x 1 weekafter recent discharge from hospital on 11/1 for ulcerative colitis flare requiring steroids and was found to have severe left-sided ulcerative colitis on 11/7 by flex sigmoidoscopy.During hospital stay she was treated with IV Solu-Medrol with transition to oral prednisone.  Patient was awaiting starting a biologic such as Remicade pending negative TB and CMV testing.  On 11/13 patient had worsening abdominal pain prompting CT abdomen which showed large amount of free air.  Initial review showed no extravasation of oral contrast and non-toxic appearing patient per chart review.  Was not felt that patient required emergent surgery at time of CT abdomen given patient's clinical status and patient was hesitant about proceeding with surgery; however, patient's clinical status deteriorated on 11/14 with tachycardia, decreased oxygen saturation and much more tender on abdominal exam for which surgery recommended exploratory laparotomy.  Patient underwent colostomy and colectomy on 11/14.  After surgery she was monitored in ICU and intubated.  Extubated on 14/48 with no complications.  Subjective Complains of abdominal pain   Assessment/Plan:  #Perforated sigmoid colon status post left colectomy and transverse colostomy (11/14). Perforation likely in relation to severe ulcerative colitis. Monitor bowel function per surgery. NPO, likely TPN in next 24 hours, pharmacy aware, IV morphine 1 mg PRN. IV antiemetics. IV zosyn for presumed intrabdominal infection given perforation  #Acute hypoxic respiratory failure. Intubated 11/14, extubated 11/15. Only on 2 L [place] currently.  Wean oxygen to room air  #Severe Ulcerative colitis. Reflected in flex sig on 11/7 and persistent thickening of woel wall on CT abd imaging on 11/13. Surgery would like to hold on steroids given high risk for abdominal abscess and rectal stump issues. Per GI, not a candidate for anti-TNF given likely abdominal infection. Gi considering low dose IV solumedrol to avoid adrenal insufficiency and agrees with concern for steroid use.   #Hypertension. Previously had some concern for hypotension. Will allow given SBP in 140s, monitor, IV metoprolol PRN. Holding home amlodipine and Toprol  #Acute on chronic anemia,  blood loss anemia secondary to hematochezia from UC and worsened from recent surgery. Hgb stable at 7.4 (previous baseline 8-9), no gross bleeding was previously on xarelto serial CBC, transfuse goal > 7  #Chronic atrial fibrillation. Holding xarelto given recent surgery and low hemoglobin. Surgery recommends lovenox for now until stabilization of hgb before transition to heparin  #Hyperglycemia. Likely steroid induced. A1c 6.6 ( 05/2018). NPO for now. Glucose on BMP in 220s. Monitor CBG, sliding scale as needed   Code Status: changed from Full Code back to DNR after discussion with her daughter [name] Plan on 11/16.  She confirms her mother would not want chest compressions or intubation. ( it was full Code before to allow for intubation after her surgery)  Family Communication: daughter updated at bedside   Disposition Plan: pain control, monitor bowel function, anticipate TPN, zosyn,     Consultants:  GI, Surgery  Procedures:  11/7- flex sig  11/14 -bowel perforation repair   Antimicrobials: Anti-infectives (From admission, onward)   Start     Dose/Rate Route Frequency Ordered Stop   07/03/18 2200  piperacillin-tazobactam (ZOSYN) IVPB 3.375 g     3.375 g 12.5  mL/hr over 240 Minutes Intravenous Every 8 hours 07/03/18 2034     07/03/18 1200  piperacillin-tazobactam (ZOSYN)  IVPB 3.375 g  Status:  Discontinued     3.375 g 12.5 mL/hr over 240 Minutes Intravenous Every 8 hours 07/03/18 0444 07/03/18 2012   07/03/18 0445  piperacillin-tazobactam (ZOSYN) IVPB 3.375 g     3.375 g 100 mL/hr over 30 Minutes Intravenous  Once 07/03/18 0440 07/03/18 0640   06/25/18 1600  cefTRIAXone (ROCEPHIN) 2 g in sodium chloride 0.9 % 100 mL IVPB  Status:  Discontinued     2 g 200 mL/hr over 30 Minutes Intravenous Every 24 hours 06/25/18 1546 06/27/18 1348   06/25/18 1600  metroNIDAZOLE (FLAGYL) IVPB 500 mg  Status:  Discontinued     500 mg 100 mL/hr over 60 Minutes Intravenous Every 8 hours 06/25/18 1546 06/27/18 1348         Cultures:  none  Telemetry:yes  DVT prophylaxis: lovenox for now until hemoglobin stable then consider heparin   Objective: Vitals:   07/05/18 0504 07/05/18 0533 07/05/18 0600 07/05/18 0800  BP: (!) 182/62 (!) 149/58 (!) 141/52   Pulse: (!) 51 63 (!) 44   Resp: (!) '30 14 12   ' Temp:    98.3 F (36.8 C)  TempSrc:    Oral  SpO2: 95% 97% 96%   Weight:      Height:        Intake/Output Summary (Last 24 hours) at 07/05/2018 0910 Last data filed at 07/05/2018 6712 Gross per 24 hour  Intake 2282.09 ml  Output 975 ml  Net 1307.09 ml   Filed Weights   07/03/18 1900 07/04/18 0400  Weight: 70 kg 70.3 kg    Exam:  Constitutional: elderly female, lying in bed comfortably Eyes: eyes closed Cardiovascular: RRR no MRGs, with no peripheral edema Respiratory: Normal respiratory effort on 2 L  Abdomen: Soft, colostomy bag in place, abdominal dressing clean and intact, no bowel sounds Neurologic: Grossly no focal neuro deficit. Psychiatric: Answering questions about pain. Oriented to self only  Data Reviewed: CBC: Recent Labs  Lab 07/02/18 0348 07/03/18 0142 07/04/18 0349 07/04/18 1510 07/05/18 0420  WBC 6.5 5.5 9.2 10.0 9.9  NEUTROABS  --  4.5  --   --   --   HGB 8.9* 8.9* 7.8* 7.8* 7.4*  HCT 28.6* 28.6* 24.1* 24.9* 23.3*  MCV  96.3 96.3 95.3 93.6 95.9  PLT 158 154 185 205 458   Basic Metabolic Panel: Recent Labs  Lab 06/30/18 0446 07/02/18 0348 07/03/18 0142 07/04/18 0349  NA 137 135 132* 135  K 5.0 4.7 4.7 4.7  CL 105 100 96* 98  CO2 '27 24 27 29  ' GLUCOSE 203* 245* 227* 222*  BUN '11 20 21 16  ' CREATININE 0.63 0.84 0.73 0.69  CALCIUM 8.0* 8.6* 8.7* 7.6*  MG 1.9  --   --   --    GFR: Estimated Creatinine Clearance: 41.9 mL/min (by C-G formula based on SCr of 0.69 mg/dL). Liver Function Tests: Recent Labs  Lab 06/30/18 0446 07/02/18 0348 07/03/18 0142  AST 16 14* 16  ALT '14 12 13  ' ALKPHOS 66 84 88  BILITOT 0.5 0.7 0.3  PROT 4.3* 4.6* 5.0*  ALBUMIN 1.9* 2.0* 2.1*   No results for input(s): LIPASE, AMYLASE in the last 168 hours. No results for input(s): AMMONIA in the last 168 hours. Coagulation Profile: No results for input(s): INR, PROTIME in the last 168 hours. Cardiac Enzymes: No results for input(s):  CKTOTAL, CKMB, CKMBINDEX, TROPONINI in the last 168 hours. BNP (last 3 results) No results for input(s): PROBNP in the last 8760 hours. HbA1C: No results for input(s): HGBA1C in the last 72 hours. CBG: No results for input(s): GLUCAP in the last 168 hours. Lipid Profile: Recent Labs    07/03/18 2004  TRIG 91   Thyroid Function Tests: No results for input(s): TSH, T4TOTAL, FREET4, T3FREE, THYROIDAB in the last 72 hours. Anemia Panel: No results for input(s): VITAMINB12, FOLATE, FERRITIN, TIBC, IRON, RETICCTPCT in the last 72 hours. Urine analysis:    Component Value Date/Time   COLORURINE STRAW (A) 06/25/2018 1658   APPEARANCEUR CLEAR 06/25/2018 1658   LABSPEC 1.005 06/25/2018 1658   PHURINE 7.0 06/25/2018 1658   GLUCOSEU NEGATIVE 06/25/2018 1658   HGBUR NEGATIVE 06/25/2018 1658   BILIRUBINUR NEGATIVE 06/25/2018 1658   KETONESUR NEGATIVE 06/25/2018 1658   PROTEINUR NEGATIVE 06/25/2018 1658   UROBILINOGEN 0.2 11/02/2010 0300   NITRITE NEGATIVE 06/25/2018 1658   LEUKOCYTESUR  NEGATIVE 06/25/2018 1658   Sepsis Labs: '@[email or web address]' (procalcitonin:4,lacticidven:4)  ) Recent Results (from the past 240 hour(s))  Blood Culture (routine x 2)     Status: None   Collection Time: 06/25/18  4:57 PM  Result Value Ref Range Status   Specimen Description   Final    BLOOD LEFT ANTECUBITAL Performed at [facility], [place] [address], [place], [place] [address]    Special Requests   Final    BOTTLES DRAWN AEROBIC AND ANAEROBIC Blood Culture adequate volume Performed at [facility] [address], [place], [place] [address]    Culture   Final    NO GROWTH 5 DAYS Performed at [facility], [place] [address] [place], [place] [address]    Report Status 06/30/2018 FINAL  Final  Urine culture     Status: Abnormal   Collection Time: 06/25/18  4:58 PM  Result Value Ref Range Status   Specimen Description   Final    URINE, RANDOM Performed at [place] [address], [place], [place] [address]    Special Requests   Final    NONE Performed at [facility], [place] [address] [place], [place] [address]    Culture 60,000 COLONIES/mL ESCHERICHIA COLI (A)  Final   Report Status 06/28/2018 FINAL  Final   Organism ID, Bacteria ESCHERICHIA COLI (A)  Final      Susceptibility   Escherichia coli - MIC*    AMPICILLIN >=32 RESISTANT Resistant     CEFAZOLIN <=4 SENSITIVE Sensitive     CEFTRIAXONE <=1 SENSITIVE Sensitive     CIPROFLOXACIN <=0.25 SENSITIVE Sensitive     GENTAMICIN >=16 RESISTANT Resistant     IMIPENEM <=0.25 SENSITIVE Sensitive     NITROFURANTOIN <=16 SENSITIVE Sensitive     TRIMETH/SULFA >=320 RESISTANT Resistant     AMPICILLIN/SULBACTAM >=32 RESISTANT Resistant     PIP/TAZO <=4 SENSITIVE Sensitive     Extended ESBL NEGATIVE Sensitive     * 60,000 COLONIES/mL ESCHERICHIA COLI  Blood Culture (routine x 2)     Status: None   Collection Time: 06/25/18  7:45 PM  Result Value Ref Range  Status   Specimen Description   Final    BLOOD LEFT HAND Performed at [facility] [address] [place], [place] [address]    Special Requests   Final    BOTTLES DRAWN AEROBIC ONLY Blood Culture adequate volume Performed at [place]  [address] [place], [place] [address]    Culture   Final    NO GROWTH 5 DAYS Performed at [facility], [place] [address], [place], [place] [address]    Report Status 06/30/2018 FINAL  Final  Culture, blood (single)     Status: None (Preliminary result)   Collection Time: 07/03/18  1:42 AM  Result Value Ref Range Status   Specimen Description   Final    BLOOD LEFT HAND Performed at [facility] [address], [place], [place] [address]    Special Requests   Final    BOTTLES DRAWN AEROBIC ONLY Blood Culture adequate volume Performed at [facility] [address], [place], [place] [address]    Culture   Final    NO GROWTH 2 DAYS Performed at [facility] [address], [place], [place] [address]    Report Status PENDING  Incomplete  MRSA PCR Screening     Status: None   Collection Time: 07/03/18  7:33 PM  Result Value Ref Range Status   MRSA by PCR NEGATIVE NEGATIVE Final    Comment:        The GeneXpert MRSA Assay (FDA approved for NASAL specimens only), is one component of a comprehensive MRSA colonization surveillance program. It is not intended to diagnose MRSA infection nor to guide or monitor treatment for MRSA infections. Performed at [facility], [place] [address] [place], [place] [address]       Studies: Dg Abd Portable 1v  Result Date: 07/05/2018 CLINICAL DATA:  NG tube placement EXAM: PORTABLE ABDOMEN - 1 VIEW COMPARISON:  CT abdomen 07/02/2018 FINDINGS: There is a nasogastric tube with the tip coiled in the stomach. There is gaseous distention of small bowel and colon. There is oral contrast material in  the distal small bowel and ascending colon. There is no evidence of pneumoperitoneum, portal venous gas or pneumatosis. There are no pathologic calcifications along the expected course of the ureters. The osseous structures are unremarkable. IMPRESSION: Nasogastric tube coiled in the stomach. Electronically Signed   By: Kathreen Devoid   On: 07/05/2018 02:47    Scheduled Meds: . chlorhexidine gluconate (MEDLINE KIT)  15 mL Mouth Rinse BID  . enoxaparin (LOVENOX) injection  40 mg Subcutaneous Q24H  . metoprolol tartrate  5 mg Intravenous Q6H  . pantoprazole (PROTONIX) IV  40 mg Intravenous Daily    Continuous Infusions: . sodium chloride 75 mL/hr at 07/04/18 2235  . piperacillin-tazobactam (ZOSYN)  IV 3.375 g (07/05/18 0509)  . propofol (DIPRIVAN) infusion Stopped (07/04/18 0801)     LOS: 10 days     Desiree Hane, MD [facility] Pager [phone]  If 7PM-7AM, please contact night-coverage [email or web address] Password [email or web address] 07/05/2018, 9:10 AM

## 2018-07-05 NOTE — Progress Notes (Signed)
Patient ID: Felicia Carlson, female   DOB: [birth year], 84 y.o.   MRN: [id number] 2 Days Post-Op   Subjective: Extubated.  Alert and appears comfortable.  Confused but responsive and conversant.  States she has abdominal pain on questioning  Objective: Vital signs in last 24 hours: Temp:  [97.3 F (36.3 C)-98.4 F (36.9 C)] 97.6 F (36.4 C) (11/16 0317) Pulse Rate:  [38-105] 44 (11/16 0600) Resp:  [11-30] 12 (11/16 0600) BP: (91-182)/(44-111) 141/52 (11/16 0600) SpO2:  [74 %-100 %] 96 % (11/16 0600) FiO2 (%):  [40 %] 40 % (11/15 0823) Last BM Date: 07/04/18  Intake/Output from previous day: 11/15 0701 - 11/16 0700 In: 2391.9 [I.V.:2047.2; IV Piggyback:344.7] Out: 975 [Urine:765; Drains:210] Intake/Output this shift: No intake/output data recorded.  General appearance: alert, cooperative, no distress and Confused to place and situation Resp: clear to auscultation bilaterally GI: Mild diffuse tenderness.  Stoma appears healthy.  JP serosanguineous. Incision/Wound: Wound packed dressing clean and dry  Lab Results:  Recent Labs    07/04/18 1510 07/05/18 0420  WBC 10.0 9.9  HGB 7.8* 7.4*  HCT 24.9* 23.3*  PLT 205 196   BMET Recent Labs    07/03/18 0142 07/04/18 0349  NA 132* 135  K 4.7 4.7  CL 96* 98  CO2 27 29  GLUCOSE 227* 222*  BUN 21 16  CREATININE 0.73 0.69  CALCIUM 8.7* 7.6*     Studies/Results: Dg Chest Port 1 View  Result Date: 07/04/2018 CLINICAL DATA:  Hypoxia EXAM: PORTABLE CHEST 1 VIEW COMPARISON:  July 03, 2018 FINDINGS: Endotracheal tube tip is 4.3 cm above the carina. Nasogastric tube tip and side port are below the diaphragm. Central catheter tip from right jugular approach is in the superior vena cava near the cavoatrial junction. Left central catheter tip is in the superior vena cava. No pneumothorax. There is airspace consolidation in the left lower lobe with small left pleural effusion. There is a minimal right pleural effusion with right  base atelectasis. Heart is upper normal in size with pulmonary vascularity normal. No adenopathy. There is aortic atherosclerosis. No bone lesions. IMPRESSION: Tube and catheter positions as described without pneumothorax. Airspace consolidation with effusion left lower lobe. Minimal right pleural effusion with right base atelectasis. Stable cardiac silhouette. There is aortic atherosclerosis. Aortic Atherosclerosis (ICD10-I70.0). Electronically Signed   By: Lowella Grip III M.D.   On: 07/04/2018 08:01   Dg Chest Port 1 View  Result Date: 07/03/2018 CLINICAL DATA:  Evaluate central line an ETT EXAM: PORTABLE CHEST 1 VIEW COMPARISON:  June 25, 2018 FINDINGS: A left PICC line terminates in the central SVC. An ET tube is in good position. An NG tube terminates below today's film. A right central line terminates in the central SVC. No pneumothorax. The cardiomediastinal silhouette is stable. Possible small right effusion with underlying atelectasis. No other abnormalities. IMPRESSION: Support apparatus as above. Possible small effusion and atelectasis in the right base. Electronically Signed   By: Dorise Bullion III M.D   On: 07/03/2018 22:28   Dg Abd Portable 1v  Result Date: 07/05/2018 CLINICAL DATA:  NG tube placement EXAM: PORTABLE ABDOMEN - 1 VIEW COMPARISON:  CT abdomen 07/02/2018 FINDINGS: There is a nasogastric tube with the tip coiled in the stomach. There is gaseous distention of small bowel and colon. There is oral contrast material in the distal small bowel and ascending colon. There is no evidence of pneumoperitoneum, portal venous gas or pneumatosis. There are no pathologic calcifications along the expected  course of the ureters. The osseous structures are unremarkable. IMPRESSION: Nasogastric tube coiled in the stomach. Electronically Signed   By: Kathreen Devoid   On: 07/05/2018 02:47   [place] Ekg Site Rite  Result Date: 07/03/2018 If Site Rite image not attached, placement could not be  confirmed due to current cardiac rhythm.   Anti-infectives: Anti-infectives (From admission, onward)   Start     Dose/Rate Route Frequency Ordered Stop   07/03/18 2200  piperacillin-tazobactam (ZOSYN) IVPB 3.375 g     3.375 g 12.5 mL/hr over 240 Minutes Intravenous Every 8 hours 07/03/18 2034     07/03/18 1200  piperacillin-tazobactam (ZOSYN) IVPB 3.375 g  Status:  Discontinued     3.375 g 12.5 mL/hr over 240 Minutes Intravenous Every 8 hours 07/03/18 0444 07/03/18 2012   07/03/18 0445  piperacillin-tazobactam (ZOSYN) IVPB 3.375 g     3.375 g 100 mL/hr over 30 Minutes Intravenous  Once 07/03/18 0440 07/03/18 0640   06/25/18 1600  cefTRIAXone (ROCEPHIN) 2 g in sodium chloride 0.9 % 100 mL IVPB  Status:  Discontinued     2 g 200 mL/hr over 30 Minutes Intravenous Every 24 hours 06/25/18 1546 06/27/18 1348   06/25/18 1600  metroNIDAZOLE (FLAGYL) IVPB 500 mg  Status:  Discontinued     500 mg 100 mL/hr over 60 Minutes Intravenous Every 8 hours 06/25/18 1546 06/27/18 1348      Assessment/Plan: s/p Procedure(s): OPEN [name] PROCEDURE Stable postoperatively.  Off ventilator. Postop delirium. On broad-spectrum antibiotics Await bowel functioning before feeding.  May benefit from TNA in the meantime.   LOS: 10 days    Edward Jolly 07/05/2018

## 2018-07-06 LAB — GLUCOSE, CAPILLARY
GLUCOSE-CAPILLARY: 112 mg/dL — AB (ref 70–99)
GLUCOSE-CAPILLARY: 67 mg/dL — AB (ref 70–99)
GLUCOSE-CAPILLARY: 102 mg/dL — AB (ref 70–99)
GLUCOSE-CAPILLARY: 133 mg/dL — AB (ref 70–99)
Glucose-Capillary: 80 mg/dL (ref 70–99)
Glucose-Capillary: 94 mg/dL (ref 70–99)
Glucose-Capillary: 145 mg/dL — ABNORMAL HIGH (ref 70–99)

## 2018-07-06 LAB — CBC
HCT: 23.2 % — ABNORMAL LOW (ref 36.0–46.0)
Hemoglobin: 7.2 g/dL — ABNORMAL LOW (ref 12.0–15.0)
MCH: 29.6 pg (ref 26.0–34.0)
MCHC: 31 g/dL (ref 30.0–36.0)
MCV: 95.5 fL (ref 80.0–100.0)
NRBC: 0.4 % — AB (ref 0.0–0.2)
PLATELETS: 191 10*3/uL (ref 150–400)
RBC: 2.43 MIL/uL — AB (ref 3.87–5.11)
RDW: 16.3 % — ABNORMAL HIGH (ref 11.5–15.5)
WBC: 7.7 10*3/uL (ref 4.0–10.5)

## 2018-07-06 LAB — COMPREHENSIVE METABOLIC PANEL
ALBUMIN: 1.7 g/dL — AB (ref 3.5–5.0)
ALT: 14 U/L (ref 0–44)
AST: 13 U/L — AB (ref 15–41)
Alkaline Phosphatase: 79 U/L (ref 38–126)
Anion gap: 5 (ref 5–15)
BUN: 11 mg/dL (ref 8–23)
CHLORIDE: 104 mmol/L (ref 98–111)
CO2: 28 mmol/L (ref 22–32)
Calcium: 7.6 mg/dL — ABNORMAL LOW (ref 8.9–10.3)
Creatinine, Ser: 0.52 mg/dL (ref 0.44–1.00)
GFR calc Af Amer: >60 mL/min (ref >60)
GFR calc non Af Amer: >60 mL/min (ref >60)
GLUCOSE: 82 mg/dL (ref 70–99)
POTASSIUM: 3.6 mmol/L (ref 3.5–5.1)
Sodium: 137 mmol/L (ref 135–145)
Total Bilirubin: 0.8 mg/dL (ref 0.3–1.2)
Total Protein: 4.3 g/dL — ABNORMAL LOW (ref 6.5–8.1)

## 2018-07-06 LAB — PHOSPHORUS: Phosphorus: 2.4 mg/dL — ABNORMAL LOW (ref 2.5–4.6)

## 2018-07-06 LAB — MAGNESIUM: MAGNESIUM: 2 mg/dL (ref 1.7–2.4)

## 2018-07-06 MED ORDER — TRAVASOL 10 % IV SOLN
INTRAVENOUS | Status: AC
  Administered 2018-07-06: 17:00:00 via INTRAVENOUS
  Filled 2018-07-06: qty 480

## 2018-07-06 MED ORDER — SODIUM CHLORIDE 0.9% FLUSH
10.0000 mL | INTRAVENOUS | Status: DC | PRN
  Administered 2018-07-08 – 2018-07-11 (×3): 10 mL
  Filled 2018-07-06 (×3): qty 40

## 2018-07-06 MED ORDER — CHLORHEXIDINE GLUCONATE CLOTH 2 % EX PADS
6.0000 | MEDICATED_PAD | Freq: Every day | CUTANEOUS | Status: DC
  Administered 2018-07-06 – 2018-07-15 (×9): 6 via TOPICAL

## 2018-07-06 MED ORDER — SODIUM CHLORIDE 0.9 % IV SOLN
INTRAVENOUS | Status: DC
  Administered 2018-07-10 – 2018-07-15 (×3): via INTRAVENOUS

## 2018-07-06 MED ORDER — DEXTROSE 50 % IV SOLN
INTRAVENOUS | Status: AC
  Administered 2018-07-06: 50 mL
  Filled 2018-07-06: qty 50

## 2018-07-06 MED ORDER — SODIUM CHLORIDE 0.9% FLUSH
10.0000 mL | Freq: Two times a day (BID) | INTRAVENOUS | Status: DC
  Administered 2018-07-06 – 2018-07-08 (×4): 10 mL
  Administered 2018-07-08: 40 mL
  Administered 2018-07-09 – 2018-07-15 (×9): 10 mL

## 2018-07-06 NOTE — Progress Notes (Signed)
 PROGRESS NOTE  Linah M Ziller MRN:[id number] DOB: 07/15/1925 DOA: 06/25/2018 PCP: Stoneking, Hal, MD  HPI/Brief Narrative  Felicia Carlson is a 84 y.o. year old female with medical history significant for ulcerative colitis, chronic A. fib on Xarelto who presented on 06/25/2018 with bright red blood per rectum, anorexia and abdominal pain x 1 weekafter recent discharge from hospital on 11/1 for ulcerative colitis flare requiring steroids and was found to have severe left-sided ulcerative colitis on 11/7 by flex sigmoidoscopy.During hospital stay she was treated with IV Solu-Medrol with transition to oral prednisone.  Patient was awaiting starting a biologic such as Remicade pending negative TB and CMV testing.  On 11/13 patient had worsening abdominal pain prompting CT abdomen which showed large amount of free air.  Initial review showed no extravasation of oral contrast and non-toxic appearing patient per chart review.  Was not felt that patient required emergent surgery at time of CT abdomen given patient's clinical status and patient was hesitant about proceeding with surgery; however, patient's clinical status deteriorated on 11/14 with tachycardia, decreased oxygen saturation and much more tender on abdominal exam for which surgery recommended exploratory laparotomy.  Patient underwent colostomy and colectomy on 11/14.  After surgery she was monitored in ICU and intubated.  Extubated on 11/15 with no complications.  Started TPN on 11/17 via PICC  Subjective No acute complaints this morning.  No acute events overnight  Assessment/Plan:  #Perforated sigmoid colon status post left colectomy and transverse colostomy (11/14). Perforation likely in relation to severe ulcerative colitis.  Starting TPN, continue to monitor for bowel function, appreciate surgery recommendations, continue IV Zosyn for presumed intra-abdominal infection given perforated bowel and exposure to GI contents.  Pain control with IV  morphine 1 mg PRN. IV antiemetics PRN  #Acute hypoxic respiratory failure. Intubated 11/14 perioperatively, extubated 11/15. Only on 2-3L [place] currently. Wean oxygen to room air with goal SPO2 greater than 92%  #Severe Ulcerative colitis. Reflected in flex sig on 11/7 and persistent thickening of bowel wall on CT abd imaging on 11/13. Surgery would like to hold on steroids given high risk for abdominal abscess and rectal stump issues. Per GI, not a candidate for anti-TNF given likely abdominal infection. Gi considering low dose IV solumedrol to avoid adrenal insufficiency and agrees with concern for steroid use, will reevaluate on 11/18.  #Hypertension.  Relatively ago.  Last BP 120-148.   monitor, IV metoprolol PRN. Holding home amlodipine and Toprol given n.p.o.  #Acute on chronic anemia,  blood loss anemia secondary to hematochezia from UC and worsened from recent surgery. Hgb stable at ~7 (previous baseline 8-9), no gross bleeding was previously on xarelto serial CBC, transfuse goal > 7  #Chronic atrial fibrillation. Holding xarelto given recent surgery and low hemoglobin. Surgery recommends lovenox for now until stabilization of hgb before transition to heparin  #Hyperglycemia. Likely steroid induced. A1c 6.6 ( 05/2018). NPO for now. Glucose on BMP in 220s. Monitor CBG, sliding scale as needed   Code Status: changed from Full Code back to DNR after discussion with her daughter [name] on 11/16.  She confirms her mother would not want chest compressions or intubation. ( it was full Code before to allow for intubation after her surgery)  Family Communication: We will update daughter by phone  Disposition Plan: pain control, monitor bowel function, start TPN, Zosyn,     Consultants:  GI, Surgery  Procedures:  11/7- flex sig  11/14 -bowel perforation repair   Antimicrobials: Anti-infectives (From   admission, onward)   Start     Dose/Rate Route Frequency Ordered Stop   07/03/18  2200  piperacillin-tazobactam (ZOSYN) IVPB 3.375 g     3.375 g 12.5 mL/hr over 240 Minutes Intravenous Every 8 hours 07/03/18 2034     07/03/18 1200  piperacillin-tazobactam (ZOSYN) IVPB 3.375 g  Status:  Discontinued     3.375 g 12.5 mL/hr over 240 Minutes Intravenous Every 8 hours 07/03/18 0444 07/03/18 2012   07/03/18 0445  piperacillin-tazobactam (ZOSYN) IVPB 3.375 g     3.375 g 100 mL/hr over 30 Minutes Intravenous  Once 07/03/18 0440 07/03/18 0640   06/25/18 1600  cefTRIAXone (ROCEPHIN) 2 g in sodium chloride 0.9 % 100 mL IVPB  Status:  Discontinued     2 g 200 mL/hr over 30 Minutes Intravenous Every 24 hours 06/25/18 1546 06/27/18 1348   06/25/18 1600  metroNIDAZOLE (FLAGYL) IVPB 500 mg  Status:  Discontinued     500 mg 100 mL/hr over 60 Minutes Intravenous Every 8 hours 06/25/18 1546 06/27/18 1348        Cultures:  none  Telemetry:yes  DVT prophylaxis: lovenox for now until hemoglobin stable then consider heparin   Objective: Vitals:   07/06/18 0800 07/06/18 0900 07/06/18 1000 07/06/18 1200  BP: (!) 148/44 122/74 (!) 148/71   Pulse: 82 65 70   Resp: _0 Temp: 99.1 F (37.3 C)   100.1 F (37.8 C)  TempSrc: Axillary   Axillary  SpO2: 98% 98% 99%   Weight:      Height:        Intake/Output Summary (Last 24 hours) at 07/06/2018 1259 Last data filed at 07/06/2018 0900 Gross per 24 hour  Intake 1462.62 ml  Output 861 ml  Net 601.62 ml   Filed Weights   07/03/18 1900 07/04/18 0400  Weight: 70 kg 70.3 kg    Exam:  Constitutional: elderly female, lying in bed comfortably Cardiovascular: RRR no MRGs, with no peripheral edema Respiratory: Normal respiratory effort on 3 L and crackles at bases on anterior auscultation Abdomen: Soft, colostomy bag in place, abdominal dressing clean and intact, no bowel sounds Neurologic: Grossly no focal neuro deficit. Psychiatric: Answering questions about pain. Oriented to self only  Data Reviewed: CBC: Recent  Labs  Lab 07/03/18 0142 07/04/18 0349 07/04/18 1510 07/05/18 0420 07/06/18 0228  WBC 5.5 9.2 10.0 9.9 7.7  NEUTROABS 4.5  --   --   --   --   HGB 8.9* 7.8* 7.8* 7.4* 7.2*  HCT 28.6* 24.1* 24.9* 23.3* 23.2*  MCV 96.3 95.3 93.6 95.9 95.5  PLT 154 185 205 196 732   Basic Metabolic Panel: Recent Labs  Lab 06/30/18 0446 07/02/18 0348 07/03/18 0142 07/04/18 0349 07/06/18 0315  NA 137 135 132* 135 137  K 5.0 4.7 4.7 4.7 3.6  CL 105 100 96* 98 104  CO2 _1 GLUCOSE 203* 245* 227* 222* 82  BUN _2 CREATININE 0.63 0.84 0.73 0.69 0.52  CALCIUM 8.0* 8.6* 8.7* 7.6* 7.6*  MG 1.9  --   --   --  2.0  PHOS  --   --   --   --  2.4*   GFR: Estimated Creatinine Clearance: 41.9 mL/min (by C-G formula based on SCr of 0.52 mg/dL). Liver Function Tests: Recent Labs  Lab 06/30/18 0446 07/02/18 0348 07/03/18 0142 07/06/18 0315  AST 16 14* 16 13*  ALT _3 14  ALKPHOS 66 84 88 79  BILITOT 0.5 0.7 0.3 0.8  PROT 4.3* 4.6* 5.0* 4.3*  ALBUMIN 1.9* 2.0* 2.1* 1.7*   No results for input(s): LIPASE, AMYLASE in the last 168 hours. No results for input(s): AMMONIA in the last 168 hours. Coagulation Profile: No results for input(s): INR, PROTIME in the last 168 hours. Cardiac Enzymes: No results for input(s): CKTOTAL, CKMB, CKMBINDEX, TROPONINI in the last 168 hours. BNP (last 3 results) No results for input(s): PROBNP in the last 8760 hours. HbA1C: No results for input(s): HGBA1C in the last 72 hours. CBG: Recent Labs  Lab 07/05/18 2315 07/06/18 0324 07/06/18 0733 07/06/18 0801 07/06/18 1133  GLUCAP 80 80 67* 112* 102*   Lipid Profile: Recent Labs    07/03/18 2004  TRIG 91   Thyroid Function Tests: No results for input(s): TSH, T4TOTAL, FREET4, T3FREE, THYROIDAB in the last 72 hours. Anemia Panel: No results for input(s): VITAMINB12, FOLATE, FERRITIN, TIBC, IRON, RETICCTPCT in the last 72 hours. Urine analysis:    Component Value Date/Time    COLORURINE STRAW (A) 06/25/2018 1658   APPEARANCEUR CLEAR 06/25/2018 1658   LABSPEC 1.005 06/25/2018 1658   PHURINE 7.0 06/25/2018 1658   GLUCOSEU NEGATIVE 06/25/2018 1658   HGBUR NEGATIVE 06/25/2018 1658   BILIRUBINUR NEGATIVE 06/25/2018 1658   KETONESUR NEGATIVE 06/25/2018 1658   PROTEINUR NEGATIVE 06/25/2018 1658   UROBILINOGEN 0.2 11/02/2010 0300   NITRITE NEGATIVE 06/25/2018 1658   LEUKOCYTESUR NEGATIVE 06/25/2018 1658   Sepsis Labs: _0 (procalcitonin:4,lacticidven:4)  ) Recent Results (from the past 240 hour(s))  Culture, blood (single)     Status: None (Preliminary result)   Collection Time: 07/03/18  1:42 AM  Result Value Ref Range Status   Specimen Description   Final    BLOOD LEFT HAND Performed at [facility], [place] [address] [place], [place] [address]    Special Requests   Final    BOTTLES DRAWN AEROBIC ONLY Blood Culture adequate volume Performed at [place] [address], [place], [place] [address]    Culture   Final    NO GROWTH 2 DAYS Performed at [place] [address] [place], [place] [address]    Report Status PENDING  Incomplete  MRSA PCR Screening     Status: None   Collection Time: 07/03/18  7:33 PM  Result Value Ref Range Status   MRSA by PCR NEGATIVE NEGATIVE Final    Comment:        The GeneXpert MRSA Assay (FDA approved for NASAL specimens only), is one component of a comprehensive MRSA colonization surveillance program. It is not intended to diagnose MRSA infection nor to guide or monitor treatment for MRSA infections. Performed at [facility], [place] [address] [place], [place] [address]       Studies: No results found.  Scheduled Meds: . chlorhexidine gluconate (MEDLINE KIT)  15 mL Mouth Rinse BID  . enoxaparin (LOVENOX) injection  40 mg Subcutaneous Q24H  . insulin aspart  0-9 Units Subcutaneous Q4H  . metoprolol tartrate  5 mg Intravenous Q6H    . pantoprazole (PROTONIX) IV  40 mg Intravenous Daily    Continuous Infusions: . sodium chloride 75 mL/hr at 07/05/18 2109  . sodium chloride    . piperacillin-tazobactam (ZOSYN)  IV Stopped (07/06/18 0858)  . TPN ADULT (ION)       LOS: 11 days     Desiree Hane, MD [facility] Pager [phone]  If 7PM-7AM,  please contact night-coverage [email or web address] Password [email or web address] 07/06/2018, 12:59 PM    

## 2018-07-06 NOTE — Evaluation (Addendum)
Physical Therapy Re- Evaluation Patient Details Name: Felicia Carlson MRN: [id number] DOB: Aug 13, 1925 Today's Date: 07/06/2018   History of Present Illness  84 yo female admitted with ulcerative colitis,  transferred to the ICU after undergoing left colectomy; end-transverse colostomy; and takedown of splenic flexure on 07/03/18, Extubated 11/15; PMHx: chest pain, abnormal ECG. hx of breast ca, UC, CKD  Clinical Impression  Pt admitted with above diagnosis. Pt currently with functional limitations due to the deficits listed below (see PT Problem List).  Pt seen today for re-eval,  Fatigues quickly, requires incr time for all functional tasks; VSS during PT session, agreeable to mobility with encouragement; Pt will benefit from skilled PT to increase their independence and safety with mobility to allow discharge to the venue listed below.  Continue to follow ina cute setting; pt will need SNF post acute     Follow Up Recommendations SNF    Equipment Recommendations  None recommended by PT    Recommendations for Other Services       Precautions / Restrictions Precautions Precautions: Fall Precaution Comments: JP drain, NGT, multiple lines Restrictions Weight Bearing Restrictions: No      Mobility  Bed Mobility Overal bed mobility: Needs Assistance Bed Mobility: Rolling Rolling: Mod assist;+2 for safety/equipment         General bed mobility comments: maxi sky used from sitting in chair  back to supine in bed; rolls right and left with multi-modal cues, rail to remove sky pad  Transfers Overall transfer level: Needs assistance Equipment used: Rolling walker (2 wheeled);None Transfers: Sit to/from Stand Sit to Stand: +2 safety/equipment;Max assist;+2 physical assistance         General transfer comment: assist for anterior-superior wt translation, incr time, multi-modal cues for sequence and technique; (pt too weak to attempt stand pivot and bed ht too high to safely perform  chair to bed)  Ambulation/Gait    unable            Stairs            Wheelchair Mobility    Modified Rankin (Stroke Patients Only)       Balance Overall balance assessment: Needs assistance Sitting-balance support: Feet supported;No upper extremity supported Sitting balance-Leahy Scale: Fair       Standing balance-Leahy Scale: Zero Standing balance comment: reliant on UEs and external assist                             Pertinent Vitals/Pain Pain Assessment: Faces Faces Pain Scale: Hurts even more Pain Location: abdomen Pain Descriptors / Indicators: Grimacing Pain Intervention(s): Monitored during session;Limited activity within patient's tolerance;Premedicated before session;Repositioned    Home Living Family/patient expects to be discharged to:: Skilled nursing facility Living Arrangements: Alone Available Help at Discharge: Family;Available PRN/intermittently                  Prior Function                 Hand Dominance        Extremity/Trunk Assessment   Upper Extremity Assessment Upper Extremity Assessment: Generalized weakness    Lower Extremity Assessment Lower Extremity Assessment: Generalized weakness       Communication      Cognition Arousal/Alertness: Awake/alert Behavior During Therapy: WFL for tasks assessed/performed   Area of Impairment: Following commands;Problem solving  Following Commands: Follows one step commands with increased time;Follows multi-step commands inconsistently     Problem Solving: Slow processing;Decreased initiation;Difficulty sequencing;Requires verbal cues;Requires tactile cues General Comments: pt verbalizes very little this session, voice is very soft, keeps eyes closed but is alert      General Comments      Exercises General Exercises - Lower Extremity Ankle Circles/Pumps: AROM;Both;10 reps   Assessment/Plan    PT Assessment  Patient needs continued PT services  PT Problem List Decreased strength;Decreased balance;Decreased mobility;Decreased activity tolerance;Decreased knowledge of use of DME       PT Treatment Interventions DME instruction;Gait training;Functional mobility training;Therapeutic activities;Balance training;Patient/family education;Therapeutic exercise    PT Goals (Current goals can be found in the Care Plan section)  Acute Rehab PT Goals PT Goal Formulation: With patient Time For Goal Achievement: 07/20/18 Potential to Achieve Goals: Fair    Frequency Min 2X/week   Barriers to discharge        Co-evaluation               AM-PAC PT "6 Clicks" Daily Activity  Outcome Measure Difficulty turning over in bed (including adjusting bedclothes, sheets and blankets)?: Unable Difficulty moving from lying on back to sitting on the side of the bed? : Unable Difficulty sitting down on and standing up from a chair with arms (e.g., wheelchair, bedside commode, etc,.)?: Unable Help needed moving to and from a bed to chair (including a wheelchair)?: Total Help needed walking in hospital room?: Total Help needed climbing 3-5 steps with a railing? : Total 6 Click Score: 6    End of Session Equipment Utilized During Treatment: Gait belt Activity Tolerance: Patient limited by fatigue Patient left: in bed;with bed alarm set;with call bell/phone within reach;with family/visitor present   PT Visit Diagnosis: Muscle weakness (generalized) (M62.81);Difficulty in walking, not elsewhere classified (R26.2)    Time: 8101-7510 PT Time Calculation (min) (ACUTE ONLY): 39 min   Charges:   PT Evaluation $PT Re-evaluation: 1 Re-eval PT Treatments $Therapeutic Activity: 23-37 mins        Kenyon Ana, PT  Pager: [phone] Acute Rehab Dept [facility]): [phone]   07/06/2018   [facility] 07/06/2018, 12:58 PM

## 2018-07-06 NOTE — Progress Notes (Signed)
PHARMACY - ADULT TOTAL PARENTERAL NUTRITION CONSULT NOTE   Pharmacy Consult for TPN Indication: s/p sigmoid colon perforation repair  Patient Measurements: Height: 5\' 1"  (154.9 cm) Weight: 154 lb 15.7 oz (70.3 kg) IBW/kg (Calculated) : 47.8   Body mass index is 29.28 kg/m. Usual Weight:   Insulin Requirements: none  Current Nutrition: NPO  IVF: NS at 75 ml/hr  Central access: PICC TPN start date: 11/17  ASSESSMENT                                                                                                          HPI: Patient with PMH significant for ulcerative colitis, chronic afib on xarelto, CKD, HTN, and breast. Recently discharged from [facility] on 06/20/18 for UC flare and prescribed steroids. Presents this admission on 07/02/18 with abdominal cramping and constipation. CT abdomen showed large amount of free air.  Significant events:  11/14: colostomy and colectomy, intubated 11/15: extubated 11/17: start TPN  Today:    Glucose: No hx DM, A1c 6.6 05/2018, however, steroid-induced hyperglycemia  Electrolytes: Na/K/Mg/corrected Ca 9.44; Phos slightly low 2.4  Renal: SCr wnl/stable, UOP 780cc/24hr, I/O (+) 646cc  LFTs: WNL  TGs: check in AM  Prealbumin: check in AM  NUTRITIONAL GOALS                                                                                             RD recs (11/15): 1650-1850 kcal, 80-90g protein per day Custom TPN at a goal rate of 29ml/hr with 10% protein 50g/L, 17% dextrose + 25g/L 30% fat emulsion to provide: 90g/day protein, 1850 Kcal/day.  Plan: At 1800 tonight: Begin custom TPN at 40 ml/hr Hold 20% lipid emulsion for first 7 days for ICU patients per ASPEN guidelines (Start date 07/13/18) This TPN provides 48 g of protein, 163 g of dextrose, for a total of 746 kcals meeting 40% of patient kcal, needs and 54% of protein needs Electrolytes in TPN: standard concentration; Cl:Ac 1:1 Add MVI, trace elements to TPN Continue sensitive  SSI q4h and adjust as needed NS IVMF, decrease to 35 ml/hr Monitor TPN labs, CMET, Mg, Phos in AM F/U daily  Peggyann Juba, PharmD, BCPS Pager: [phone] 07/06/2018,7:56 AM

## 2018-07-06 NOTE — Progress Notes (Signed)
Patient ID: Felicia Carlson, female   DOB: [birth year], 84 y.o.   MRN: [id number] 3 Days Post-Op   Subjective: Mild abdominal pain.  No other complaints.  Objective: Vital signs in last 24 hours: Temp:  [97.2 F (36.2 C)-98.3 F (36.8 C)] 98 F (36.7 C) (11/17 0357) Pulse Rate:  [30-126] 83 (11/17 0600) Resp:  [12-19] 16 (11/17 0600) BP: (127-165)/(52-126) 135/91 (11/17 0600) SpO2:  [84 %-100 %] 96 % (11/17 0600) Last BM Date: 07/05/18  Intake/Output from previous day: 11/16 0701 - 11/17 0700 In: 1707.6 [I.V.:1637.6; NG/GT:20; IV Piggyback:50] Out: 1061 [Urine:780; Drains:280; Stool:1] Intake/Output this shift: No intake/output data recorded.  General appearance: alert, cooperative, no distress and Remains confused Resp: clear to auscultation bilaterally GI: normal findings: soft, non-tender and Stoma appears healthy.  No ostomy output yet.  NG in place. Incision/Wound: Dressing change, wound clean.  JP drainage serosanguineous  Lab Results:  Recent Labs    07/05/18 0420 07/06/18 0228  WBC 9.9 7.7  HGB 7.4* 7.2*  HCT 23.3* 23.2*  PLT 196 191   BMET Recent Labs    07/04/18 0349 07/06/18 0315  NA 135 137  K 4.7 3.6  CL 98 104  CO2 29 28  GLUCOSE 222* 82  BUN 16 11  CREATININE 0.69 0.52  CALCIUM 7.6* 7.6*   Results for orders placed or performed during the hospital encounter of 06/25/18  Blood Culture (routine x 2)     Status: None   Collection Time: 06/25/18  4:57 PM  Result Value Ref Range Status   Specimen Description   Final    BLOOD LEFT ANTECUBITAL Performed at [facility] [address] [place], [place] [address]    Special Requests   Final    BOTTLES DRAWN AEROBIC AND ANAEROBIC Blood Culture adequate volume Performed at [facility] [address], [place], [place] [address]    Culture   Final    NO GROWTH 5 DAYS Performed at [facility], [place] [address], [place], [place] [address]    Report Status  06/30/2018 FINAL  Final  Urine culture     Status: Abnormal   Collection Time: 06/25/18  4:58 PM  Result Value Ref Range Status   Specimen Description   Final    URINE, RANDOM Performed at [facility] [address], [place], [place] [address]    Special Requests   Final    NONE Performed at [facility], [place] [address] [place], [place] [address]    Culture 60,000 COLONIES/mL ESCHERICHIA COLI (A)  Final   Report Status 06/28/2018 FINAL  Final   Organism ID, Bacteria ESCHERICHIA COLI (A)  Final      Susceptibility   Escherichia coli - MIC*    AMPICILLIN >=32 RESISTANT Resistant     CEFAZOLIN <=4 SENSITIVE Sensitive     CEFTRIAXONE <=1 SENSITIVE Sensitive     CIPROFLOXACIN <=0.25 SENSITIVE Sensitive     GENTAMICIN >=16 RESISTANT Resistant     IMIPENEM <=0.25 SENSITIVE Sensitive     NITROFURANTOIN <=16 SENSITIVE Sensitive     TRIMETH/SULFA >=320 RESISTANT Resistant     AMPICILLIN/SULBACTAM >=32 RESISTANT Resistant     PIP/TAZO <=4 SENSITIVE Sensitive     Extended ESBL NEGATIVE Sensitive     * 60,000 COLONIES/mL ESCHERICHIA COLI  Blood Culture (routine x 2)     Status: None   Collection Time: 06/25/18  7:45 PM  Result Value Ref Range Status  Specimen Description   Final    BLOOD LEFT HAND Performed at [facility] [address] [place], [place] [address]    Special Requests   Final    BOTTLES DRAWN AEROBIC ONLY Blood Culture adequate volume Performed at [facility] [address] [place], [place] [address]    Culture   Final    NO GROWTH 5 DAYS Performed at [facility], [place] [address], [place], [place] [address]    Report Status 06/30/2018 FINAL  Final  Culture, blood (single)     Status: None (Preliminary result)   Collection Time: 07/03/18  1:42 AM  Result Value Ref Range Status   Specimen Description   Final    BLOOD LEFT HAND Performed at [facility] [address] [place], [place] [address]    Special Requests   Final    BOTTLES DRAWN AEROBIC ONLY Blood Culture adequate volume Performed at [facility] [address], [place], [place] [address]    Culture   Final    NO GROWTH 2 DAYS Performed at [facility] [address] [place], [place] [address]    Report Status PENDING  Incomplete  MRSA PCR Screening     Status: None   Collection Time: 07/03/18  7:33 PM  Result Value Ref Range Status   MRSA by PCR NEGATIVE NEGATIVE Final    Comment:        The GeneXpert MRSA Assay (FDA approved for NASAL specimens only), is one component of a comprehensive MRSA colonization surveillance program. It is not intended to diagnose MRSA infection nor to guide or monitor treatment for MRSA infections. Performed at [facility], [place] [address] [place], [place] [address]      Studies/Results: Dg Abd Portable 1v  Result Date: 07/05/2018 CLINICAL DATA:  NG tube placement EXAM: PORTABLE ABDOMEN - 1 VIEW COMPARISON:  CT abdomen 07/02/2018 FINDINGS: There is a nasogastric tube with the tip coiled in the stomach. There is gaseous distention of small bowel and colon. There is oral contrast material in the distal small bowel and ascending colon. There is no evidence of pneumoperitoneum, portal venous gas or pneumatosis. There are no pathologic calcifications along the expected course of the ureters. The osseous structures are unremarkable. IMPRESSION: Nasogastric tube coiled in the stomach. Electronically Signed   By: Kathreen Devoid   On: 07/05/2018 02:47    Anti-infectives: Anti-infectives (From admission, onward)   Start     Dose/Rate Route Frequency Ordered Stop   07/03/18 2200  piperacillin-tazobactam (ZOSYN) IVPB 3.375 g     3.375 g 12.5 mL/hr over 240 Minutes Intravenous Every 8 hours 07/03/18 2034     07/03/18 1200  piperacillin-tazobactam (ZOSYN) IVPB 3.375 g  Status:  Discontinued     3.375  g 12.5 mL/hr over 240 Minutes Intravenous Every 8 hours 07/03/18 0444 07/03/18 2012   07/03/18 0445  piperacillin-tazobactam (ZOSYN) IVPB 3.375 g     3.375 g 100 mL/hr over 30 Minutes Intravenous  Once 07/03/18 0440 07/03/18 0640   06/25/18 1600  cefTRIAXone (ROCEPHIN) 2 g in sodium chloride 0.9 % 100 mL IVPB  Status:  Discontinued     2 g 200 mL/hr over 30 Minutes Intravenous Every 24 hours 06/25/18 1546 06/27/18 1348   06/25/18 1600  metroNIDAZOLE (FLAGYL) IVPB 500 mg  Status:  Discontinued     500 mg 100 mL/hr over 60 Minutes Intravenous Every 8 hours 06/25/18  1546 06/27/18 1348      Assessment/Plan: Status post Hartmann resection for perforated colon, probable colitis Stable postoperatively.  Librium FEN-to start TNA today.  Await bowel function and continue NG ID-afebrile, WBC normal.  On Zosyn. But out of bed yesterday.  Will order PT OT.    LOS: 11 days    Edward Jolly 07/06/2018

## 2018-07-07 LAB — BPAM RBC
BLOOD PRODUCT EXPIRATION DATE: 201912092359
Blood Product Expiration Date: 201912092359
ISSUE DATE / TIME: 201911141705
ISSUE DATE / TIME: 201911141705
UNIT TYPE AND RH: 5100
Unit Type and Rh: 5100

## 2018-07-07 LAB — COMPREHENSIVE METABOLIC PANEL
ALT: 13 U/L (ref 0–44)
AST: 12 U/L — AB (ref 15–41)
Albumin: 1.5 g/dL — ABNORMAL LOW (ref 3.5–5.0)
Alkaline Phosphatase: 67 U/L (ref 38–126)
Anion gap: 5 (ref 5–15)
BUN: 10 mg/dL (ref 8–23)
CALCIUM: 7.4 mg/dL — AB (ref 8.9–10.3)
CO2: 26 mmol/L (ref 22–32)
CREATININE: 0.49 mg/dL (ref 0.44–1.00)
Chloride: 107 mmol/L (ref 98–111)
Glucose, Bld: 173 mg/dL — ABNORMAL HIGH (ref 70–99)
Potassium: 3.4 mmol/L — ABNORMAL LOW (ref 3.5–5.1)
Sodium: 138 mmol/L (ref 135–145)
Total Bilirubin: 0.5 mg/dL (ref 0.3–1.2)
Total Protein: 4 g/dL — ABNORMAL LOW (ref 6.5–8.1)

## 2018-07-07 LAB — TYPE AND SCREEN
ABO/RH(D): O POS
Antibody Screen: NEGATIVE
UNIT DIVISION: 0
Unit division: 0

## 2018-07-07 LAB — PHOSPHORUS: PHOSPHORUS: 1.6 mg/dL — AB (ref 2.5–4.6)

## 2018-07-07 LAB — GLUCOSE, CAPILLARY
GLUCOSE-CAPILLARY: 171 mg/dL — AB (ref 70–99)
GLUCOSE-CAPILLARY: 167 mg/dL — AB (ref 70–99)
GLUCOSE-CAPILLARY: 177 mg/dL — AB (ref 70–99)
GLUCOSE-CAPILLARY: 107 mg/dL — AB (ref 70–99)
GLUCOSE-CAPILLARY: 157 mg/dL — AB (ref 70–99)
Glucose-Capillary: 178 mg/dL — ABNORMAL HIGH (ref 70–99)

## 2018-07-07 LAB — CBC
HEMATOCRIT: 21.2 % — AB (ref 36.0–46.0)
Hemoglobin: 6.5 g/dL — CL (ref 12.0–15.0)
MCH: 29.5 pg (ref 26.0–34.0)
MCHC: 30.7 g/dL (ref 30.0–36.0)
MCV: 96.4 fL (ref 80.0–100.0)
NRBC: 0.2 % (ref 0.0–0.2)
PLATELETS: 190 10*3/uL (ref 150–400)
RBC: 2.2 MIL/uL — AB (ref 3.87–5.11)
RDW: 15.7 % — ABNORMAL HIGH (ref 11.5–15.5)
WBC: 8.3 10*3/uL (ref 4.0–10.5)

## 2018-07-07 LAB — DIFFERENTIAL
Abs Immature Granulocytes: 0.12 10*3/uL — ABNORMAL HIGH (ref 0.00–0.07)
BASOS PCT: 0 %
Basophils Absolute: 0 10*3/uL (ref 0.0–0.1)
Eosinophils Absolute: 0.1 10*3/uL (ref 0.0–0.5)
Eosinophils Relative: 1 %
Immature Granulocytes: 1 %
LYMPHS PCT: 7 %
Lymphs Abs: 0.6 10*3/uL — ABNORMAL LOW (ref 0.7–4.0)
Monocytes Absolute: 0.8 10*3/uL (ref 0.1–1.0)
Monocytes Relative: 10 %
Neutro Abs: 6.7 10*3/uL (ref 1.7–7.7)
Neutrophils Relative %: 81 %

## 2018-07-07 LAB — MAGNESIUM: MAGNESIUM: 1.9 mg/dL (ref 1.7–2.4)

## 2018-07-07 LAB — PREALBUMIN: PREALBUMIN: 5.2 mg/dL — AB (ref 18–38)

## 2018-07-07 LAB — PREPARE RBC (CROSSMATCH)

## 2018-07-07 LAB — HEMOGLOBIN AND HEMATOCRIT, BLOOD
HEMATOCRIT: 25.4 % — AB (ref 36.0–46.0)
Hemoglobin: 8.3 g/dL — ABNORMAL LOW (ref 12.0–15.0)

## 2018-07-07 LAB — TRIGLYCERIDES: TRIGLYCERIDES: 79 mg/dL (ref <150)

## 2018-07-07 MED ORDER — METOPROLOL TARTRATE 5 MG/5ML IV SOLN
5.0000 mg | Freq: Once | INTRAVENOUS | Status: AC
  Administered 2018-07-07: 5 mg via INTRAVENOUS
  Filled 2018-07-07: qty 5

## 2018-07-07 MED ORDER — POTASSIUM PHOSPHATES 15 MMOLE/5ML IV SOLN
20.0000 mmol | Freq: Once | INTRAVENOUS | Status: AC
  Administered 2018-07-07: 20 mmol via INTRAVENOUS
  Filled 2018-07-07: qty 6.67

## 2018-07-07 MED ORDER — TRAVASOL 10 % IV SOLN
INTRAVENOUS | Status: AC
  Administered 2018-07-07: 18:00:00 via INTRAVENOUS
  Filled 2018-07-07: qty 720

## 2018-07-07 MED ORDER — SODIUM CHLORIDE 0.9% IV SOLUTION: Freq: Once | INTRAVENOUS | Status: DC

## 2018-07-07 MED ORDER — POTASSIUM CHLORIDE 10 MEQ/50ML IV SOLN: 10.0000 meq | INTRAVENOUS | Status: DC

## 2018-07-07 MED ORDER — TRAVASOL 10 % IV SOLN: INTRAVENOUS | Status: DC

## 2018-07-07 MED ORDER — POTASSIUM CHLORIDE 10 MEQ/50ML IV SOLN
10.0000 meq | INTRAVENOUS | Status: AC
  Administered 2018-07-07 (×4): 10 meq via INTRAVENOUS
  Filled 2018-07-07 (×4): qty 50

## 2018-07-07 NOTE — Progress Notes (Signed)
4 Days Post-Op    CC: Abdominal pain/rectal bleeding  Subjective: Patient she will talk to you.  Says she does not feel very well so far.  The ostomy looks fine.   No flatus or stool, some sweats in the ostomy bag.  Objective: Vital signs in last 24 hours: Temp:  [98.5 F (36.9 C)-100.1 F (37.8 C)] 98.5 F (36.9 C) (11/18 0355) Pulse Rate:  [39-153] 61 (11/18 0600) Resp:  [14-25] 20 (11/18 0600) BP: (120-162)/(39-74) 147/57 (11/18 0600) SpO2:  [96 %-100 %] 96 % (11/18 0600) Last BM Date: 07/06/18 1938 IV 960 urine 300 Drain TM 100.1; VSS K+ 3.4,glucose 173 1 view abd 11/16:There is a nasogastric tube with the tip coiled in the stomach. There is gaseous distention of small bowel and colon. There is oral contrast material in the distal small bowel and ascending colon. There is no evidence of pneumoperitoneum, portal venous gas or Pneumatosis.  CXR 11/15:  Tube and catheter positions as described without pneumothorax. Airspace consolidation with effusion left lower lobe. Minimal right pleural effusion with right base atelectasis.  Intake/Output from previous day: 11/17 0701 - 11/18 0700 In: 1938.6 [I.V.:1826.1; IV Piggyback:112.5] Out: 1260 [Urine:960; Drains:300] Intake/Output this shift: No intake/output data recorded.  General appearance: alert, cooperative, no distress and frail appearing and tired. Resp: clear to auscultation bilaterally and anterior exam GI: tender, open wound is OK, JP drain with serous fluid in it.  Just some sweat in the ostomy bag, no stool or stool like liquid, no gas.  Lab Results:  Recent Labs    07/06/18 0228 07/07/18 0519  WBC 7.7 8.3  HGB 7.2* 6.5*  HCT 23.2* 21.2*  PLT 191 190    BMET Recent Labs    07/06/18 0315 07/07/18 0519  NA 137 138  K 3.6 3.4*  CL 104 107  CO2 28 26  GLUCOSE 82 173*  BUN 11 10  CREATININE 0.52 0.49  CALCIUM 7.6* 7.4*   PT/INR No results for input(s): LABPROT, INR in the last 72  hours.  Recent Labs  Lab 07/02/18 0348 07/03/18 0142 07/06/18 0315 07/07/18 0519  AST 14* 16 13* 12*  ALT _0 ALKPHOS 84 88 79 67  BILITOT 0.7 0.3 0.8 0.5  PROT 4.6* 5.0* 4.3* 4.0*  ALBUMIN 2.0* 2.1* 1.7* 1.5*     Lipase     Component Value Date/Time   LIPASE 16 06/13/2018 1754     Medications: . sodium chloride   Intravenous Once  . chlorhexidine gluconate (MEDLINE KIT)  15 mL Mouth Rinse BID  . Chlorhexidine Gluconate Cloth  6 each Topical Daily  . enoxaparin (LOVENOX) injection  40 mg Subcutaneous Q24H  . insulin aspart  0-9 Units Subcutaneous Q4H  . metoprolol tartrate  5 mg Intravenous Q6H  . pantoprazole (PROTONIX) IV  40 mg Intravenous Daily  . sodium chloride flush  10-40 mL Intracatheter Q12H   . sodium chloride 35 mL/hr at 07/06/18 1728  . piperacillin-tazobactam (ZOSYN)  IV 12.5 mL/hr at 07/07/18 0630  . potassium chloride    . potassium PHOSPHATE IVPB (in mmol)    . TPN ADULT (ION) 40 mL/hr at 07/07/18 0630  . TPN ADULT (ION)     Anti-infectives (From admission, onward)   Start     Dose/Rate Route Frequency Ordered Stop   07/03/18 2200  piperacillin-tazobactam (ZOSYN) IVPB 3.375 g     3.375 g 12.5 mL/hr over 240 Minutes Intravenous Every 8 hours 07/03/18 2034  07/03/18 1200  piperacillin-tazobactam (ZOSYN) IVPB 3.375 g  Status:  Discontinued     3.375 g 12.5 mL/hr over 240 Minutes Intravenous Every 8 hours 07/03/18 0444 07/03/18 2012   07/03/18 0445  piperacillin-tazobactam (ZOSYN) IVPB 3.375 g     3.375 g 100 mL/hr over 30 Minutes Intravenous  Once 07/03/18 0440 07/03/18 0640   06/25/18 1600  cefTRIAXone (ROCEPHIN) 2 g in sodium chloride 0.9 % 100 mL IVPB  Status:  Discontinued     2 g 200 mL/hr over 30 Minutes Intravenous Every 24 hours 06/25/18 1546 06/27/18 1348   06/25/18 1600  metroNIDAZOLE (FLAGYL) IVPB 500 mg  Status:  Discontinued     500 mg 100 mL/hr over 60 Minutes Intravenous Every 8 hours 06/25/18 1546 06/27/18 1348      Assessment/Plan Acute hypoxic respiratory failure -extubated 11/15 HX severe colitis -Zosyn day 4 Hypertension History of acute on  chronic anemia - H/H 6.5/21 this AM History of chronic atrial fibrillation -on Xarelto at home DNR Severe Malnutrition - prealbumin 5.2 - TNA per Medicine   Perforated sigmoid colon with fecal contamination Left colectomy, and transverse colostomy, takedown splenic flexure, 07/03/2018 Dr. Rolm Bookbinder POD #4  FEN: TPN/n.p.o. ID: Rocephin/Flagyl 11/6 - 11/8; Zosyn 11/14 =>> DVT: Lovenox Follow up:  Dr. Donne Hazel   Plan:  Off Vent on a nasal cannula, NO IS.  She has been up to the chair.  Her daughter is present and says she uses a walker but gets up and around at home.  She has visited [place] and [place] this year.  Works in her garden.  We will work on mobilizing her more.  Give her some sips of clears from the floor, but we cannot really run a diet yet she has more bowel function.  Will defer anticoagulation, and treatment of anemia Dr. Lonny Prude.    LOS: 12 days    Malaia Buchta 07/07/2018 [phone]

## 2018-07-07 NOTE — Progress Notes (Signed)
PROGRESS NOTE  [name]:[id number] DOB: [birth year] DOA: 06/25/2018 PCP: Lajean Manes, MD  HPI/Brief Narrative  Felicia Carlson is a 84 y.o. year old female with medical history significant for ulcerative colitis, chronic A. fib on Xarelto who presented on 06/25/2018 with bright red blood per rectum, anorexia and abdominal pain x 1 weekafter recent discharge from hospital on 11/1 for ulcerative colitis flare requiring steroids and was found to have severe left-sided ulcerative colitis on 11/7 by flex sigmoidoscopy.During hospital stay she was treated with IV Solu-Medrol with transition to oral prednisone.  Patient was awaiting starting a biologic such as Remicade pending negative TB and CMV testing.  On 11/13 patient had worsening abdominal pain prompting CT abdomen which showed large amount of free air.  Initial review showed no extravasation of oral contrast and non-toxic appearing patient per chart review.  Was not felt that patient required emergent surgery at time of CT abdomen given patient's clinical status and patient was hesitant about proceeding with surgery; however, patient's clinical status deteriorated on 11/14 with tachycardia, decreased oxygen saturation and much more tender on abdominal exam for which surgery recommended exploratory laparotomy.  Patient underwent colostomy and colectomy on 11/14.  After surgery she was monitored in ICU and intubated.  Extubated on 21/30 with no complications.  Started TPN on 11/17 via PICC  Subjective Feels pain is better controlled.   Assessment/Plan:  #Perforated sigmoid colon status post left colectomy and transverse colostomy (11/14). Perforation likely in relation to severe ulcerative colitis. Tolerating TPN, continue to monitor for bowel function, appreciate surgery recommendations, continue IV Zosyn for presumed intra-abdominal infection given perforated bowel and exposure to GI contents.  Pain control with IV morphine 1 mg PRN. IV  antiemetics PRN  #Acute hypoxic respiratory failure. Intubated 11/14 perioperatively, extubated 11/15. Only on 2-3L [place] currently. Wean oxygen to room air with goal SPO2 greater than 92%. Incentive spirometer, OOB to chair and mobilizing with nursing assistance  #Severe Ulcerative colitis. Reflected in flex sig on 11/7 and persistent thickening of bowel wall on CT abd imaging on 11/13. Surgery would like to hold on steroids given high risk for abdominal abscess and rectal stump issues. Per GI, not a candidate for anti-TNF given likely abdominal infection. Will follow with Dr. Michail Sermon as outpatient, GI following peripherally here.  #Hypertension.  Relatively ago.  Last BP 120-148.   monitor, IV metoprolol PRN. Holding home amlodipine and Toprol given n.p.o.  #Acute on chronic anemia,  blood loss anemia secondary to hematochezia from UC and worsened from recent surgery. Hgb stable at ~7 (previous baseline 8-9), no gross bleeding was previously on xarelto serial CBC, transfuse goal > 7  #Chronic atrial fibrillation.  Chads VASC 5. Previously holding xarelto given recent surgery and low hemoglobin. Currently on lovenox subcutaneous, will discuss with surgery given recent procedure before transition to heparin  #Hyperglycemia. Likely steroid induced. A1c 6.6 ( 05/2018). NPO for now until bowel function. Glucose on BMP in 220s. Monitor CBG, sliding scale as needed   Code Status: changed from Full Code back to DNR after discussion with her daughter [name] Plan on 11/16.  She confirms her mother would not want chest compressions or intubation. ( it was full Code before to allow for intubation after her surgery)  Family Communication: Updated daughter at bedside Disposition Plan: pain control, monitor bowel function, monitor on TPN, Zosyn,     Consultants:  GI, Surgery  Procedures:  11/7- flex sig  11/14 -bowel perforation repair  Antimicrobials: Anti-infectives (From admission, onward)    Start     Dose/Rate Route Frequency Ordered Stop   07/03/18 2200  piperacillin-tazobactam (ZOSYN) IVPB 3.375 g     3.375 g 12.5 mL/hr over 240 Minutes Intravenous Every 8 hours 07/03/18 2034     07/03/18 1200  piperacillin-tazobactam (ZOSYN) IVPB 3.375 g  Status:  Discontinued     3.375 g 12.5 mL/hr over 240 Minutes Intravenous Every 8 hours 07/03/18 0444 07/03/18 2012   07/03/18 0445  piperacillin-tazobactam (ZOSYN) IVPB 3.375 g     3.375 g 100 mL/hr over 30 Minutes Intravenous  Once 07/03/18 0440 07/03/18 0640   06/25/18 1600  cefTRIAXone (ROCEPHIN) 2 g in sodium chloride 0.9 % 100 mL IVPB  Status:  Discontinued     2 g 200 mL/hr over 30 Minutes Intravenous Every 24 hours 06/25/18 1546 06/27/18 1348   06/25/18 1600  metroNIDAZOLE (FLAGYL) IVPB 500 mg  Status:  Discontinued     500 mg 100 mL/hr over 60 Minutes Intravenous Every 8 hours 06/25/18 1546 06/27/18 1348        Cultures:  none  Telemetry:yes  DVT prophylaxis: lovenox for now until hemoglobin stable then consider heparin gtt   Objective: Vitals:   07/07/18 1100 07/07/18 1200 07/07/18 1445 07/07/18 1504  BP: (!) 146/71  140/78   Pulse: (!) 146  86 91  Resp: (!) 24  16 (!) 37  Temp:  97.7 F (36.5 C) 98 F (36.7 C)   TempSrc:  Oral Oral   SpO2: (!) 88%  (!) 88% 96%  Weight:      Height:        Intake/Output Summary (Last 24 hours) at 07/07/2018 1538 Last data filed at 07/07/2018 1504 Gross per 24 hour  Intake 1578.61 ml  Output 990 ml  Net 588.61 ml   Filed Weights   07/03/18 1900 07/04/18 0400  Weight: 70 kg 70.3 kg    Exam:  Constitutional: elderly female, lying in bed comfortably Cardiovascular: RRR no MRGs, with no peripheral edema Respiratory: Normal respiratory effort on 3 L and clear breat sounds Abdomen: Soft, colostomy bag in place, abdominal dressing clean and intact, no bowel sounds Neurologic: Grossly no focal neuro deficit. Psychiatric: Answering questions about pain. Oriented to  person, place, context  Data Reviewed: CBC: Recent Labs  Lab 07/03/18 0142 07/04/18 0349 07/04/18 1510 07/05/18 0420 07/06/18 0228 07/07/18 0519  WBC 5.5 9.2 10.0 9.9 7.7 8.3  NEUTROABS 4.5  --   --   --   --  6.7  HGB 8.9* 7.8* 7.8* 7.4* 7.2* 6.5*  HCT 28.6* 24.1* 24.9* 23.3* 23.2* 21.2*  MCV 96.3 95.3 93.6 95.9 95.5 96.4  PLT 154 185 205 196 191 668   Basic Metabolic Panel: Recent Labs  Lab 07/02/18 0348 07/03/18 0142 07/04/18 0349 07/06/18 0315 07/07/18 0519  NA 135 132* 135 137 138  K 4.7 4.7 4.7 3.6 3.4*  CL 100 96* 98 104 107  CO2 '24 27 29 28 26  ' GLUCOSE 245* 227* 222* 82 173*  BUN '20 21 16 11 10  ' CREATININE 0.84 0.73 0.69 0.52 0.49  CALCIUM 8.6* 8.7* 7.6* 7.6* 7.4*  MG  --   --   --  2.0 1.9  PHOS  --   --   --  2.4* 1.6*   GFR: Estimated Creatinine Clearance: 41.9 mL/min (by C-G formula based on SCr of 0.49 mg/dL). Liver Function Tests: Recent Labs  Lab 07/02/18 0348 07/03/18 0142 07/06/18 0315 07/07/18 1594  AST 14* 16 13* 12*  ALT '12 13 14 13  ' ALKPHOS 84 88 79 67  BILITOT 0.7 0.3 0.8 0.5  PROT 4.6* 5.0* 4.3* 4.0*  ALBUMIN 2.0* 2.1* 1.7* 1.5*   No results for input(s): LIPASE, AMYLASE in the last 168 hours. No results for input(s): AMMONIA in the last 168 hours. Coagulation Profile: No results for input(s): INR, PROTIME in the last 168 hours. Cardiac Enzymes: No results for input(s): CKTOTAL, CKMB, CKMBINDEX, TROPONINI in the last 168 hours. BNP (last 3 results) No results for input(s): PROBNP in the last 8760 hours. HbA1C: No results for input(s): HGBA1C in the last 72 hours. CBG: Recent Labs  Lab 07/06/18 1914 07/06/18 2306 07/07/18 0312 07/07/18 0810 07/07/18 1142  GLUCAP 133* 145* 171* 167* 177*   Lipid Profile: Recent Labs    07/07/18 0519  TRIG 79   Thyroid Function Tests: No results for input(s): TSH, T4TOTAL, FREET4, T3FREE, THYROIDAB in the last 72 hours. Anemia Panel: No results for input(s): VITAMINB12, FOLATE,  FERRITIN, TIBC, IRON, RETICCTPCT in the last 72 hours. Urine analysis:    Component Value Date/Time   COLORURINE STRAW (A) 06/25/2018 1658   APPEARANCEUR CLEAR 06/25/2018 1658   LABSPEC 1.005 06/25/2018 1658   PHURINE 7.0 06/25/2018 1658   GLUCOSEU NEGATIVE 06/25/2018 1658   HGBUR NEGATIVE 06/25/2018 1658   BILIRUBINUR NEGATIVE 06/25/2018 1658   KETONESUR NEGATIVE 06/25/2018 1658   PROTEINUR NEGATIVE 06/25/2018 1658   UROBILINOGEN 0.2 11/02/2010 0300   NITRITE NEGATIVE 06/25/2018 1658   LEUKOCYTESUR NEGATIVE 06/25/2018 1658   Sepsis Labs: '@LABRCNTIP' (procalcitonin:4,lacticidven:4)  ) Recent Results (from the past 240 hour(s))  Culture, blood (single)     Status: None (Preliminary result)   Collection Time: 07/03/18  1:42 AM  Result Value Ref Range Status   Specimen Description   Final    BLOOD LEFT HAND Performed at [facility], [place] [address], [place], [place] [address]    Special Requests   Final    BOTTLES DRAWN AEROBIC ONLY Blood Culture adequate volume Performed at [facility] [address], [place], [place] [address]    Culture   Final    NO GROWTH 4 DAYS Performed at [facility], [place] [address] [place], [place] [address]    Report Status PENDING  Incomplete  MRSA PCR Screening     Status: None   Collection Time: 07/03/18  7:33 PM  Result Value Ref Range Status   MRSA by PCR NEGATIVE NEGATIVE Final    Comment:        The GeneXpert MRSA Assay (FDA approved for NASAL specimens only), is one component of a comprehensive MRSA colonization surveillance program. It is not intended to diagnose MRSA infection nor to guide or monitor treatment for MRSA infections. Performed at [facility], [place] [address], [place], [place] [address]       Studies: No results found.  Scheduled Meds: . sodium chloride   Intravenous Once  . chlorhexidine gluconate (MEDLINE KIT)  15 mL Mouth Rinse BID  .  Chlorhexidine Gluconate Cloth  6 each Topical Daily  . enoxaparin (LOVENOX) injection  40 mg Subcutaneous Q24H  . insulin aspart  0-9 Units Subcutaneous Q4H  . metoprolol tartrate  5 mg Intravenous Q6H  . pantoprazole (PROTONIX) IV  40 mg Intravenous Daily  . sodium chloride flush  10-40 mL Intracatheter Q12H    Continuous Infusions: . sodium chloride 35 mL/hr at 07/06/18 1728  . piperacillin-tazobactam (ZOSYN)  IV 3.375 g (07/07/18 1452)  . potassium PHOSPHATE IVPB (in mmol) 20 mmol (07/07/18 1200)  . TPN ADULT (ION) 40 mL/hr at 07/07/18 0630  . TPN ADULT (ION)       LOS: 12 days     Desiree Hane, MD [facility] Pager [phone]  If 7PM-7AM, please contact night-coverage [email or web address] Password [facility] 07/07/2018, 3:38 PM

## 2018-07-07 NOTE — Evaluation (Signed)
Occupational Therapy Evaluation Patient Details Name: Felicia Carlson MRN: [id number] DOB: Jan 16, 1925 Today's Date: 07/07/2018    History of Present Illness 84 yo female admitted with ulcerative colitis,  transferred to the ICU after undergoing left colectomy; end-transverse colostomy; and takedown of splenic flexure on 07/03/18, Extubated 11/15; PMHx: chest pain, abnormal ECG. hx of breast ca, UC, CKD   Clinical Impression   Pt admitted with ulcerative colitis. Pt currently with functional limitations due to the deficits listed below (see OT Problem List).  Pt will benefit from skilled OT to increase their safety and independence with ADL and functional mobility for ADL to facilitate discharge to venue listed below.      Follow Up Recommendations  SNF    Equipment Recommendations  None recommended by OT       Precautions / Restrictions Precautions Precautions: Fall Precaution Comments: JP drain, NGT, multiple lines Restrictions Weight Bearing Restrictions: No      Mobility Bed Mobility               General bed mobility comments: NT  Transfers                 General transfer comment: NT        ADL either performed or assessed with clinical judgement   ADL Overall ADL's : Needs assistance/impaired     Grooming: Wash/dry face;Minimal assistance;Bed level                                 General ADL Comments: Pt agreed to grooming activity in bed.  Pt getting blood.  Pt needed VC for completeness but had good participation.  Pt also performed BUE AAROM ( shoulder)  and AROM ( elbow, wrist and hand- 10 reps each)       Vision Patient Visual Report: No change from baseline              Pertinent Vitals/Pain Pain Assessment: No/denies pain              Cognition Arousal/Alertness: Awake/alert Behavior During Therapy: WFL for tasks assessed/performed Overall Cognitive Status: No family/caregiver present to determine baseline  cognitive functioning                         Following Commands: Follows one step commands consistently           General Comments   Encouraged pt to move arms as able.  Repositioned BUE on pillows for joint protection and edema prevention            Home Living Family/patient expects to be discharged to:: Skilled nursing facility Living Arrangements: Alone Available Help at Discharge: Family;Available PRN/intermittently                                             OT Problem List: Decreased strength;Decreased activity tolerance;Impaired balance (sitting and/or standing)      OT Treatment/Interventions: Self-care/ADL training;Patient/family education;DME and/or AE instruction    OT Goals(Current goals can be found in the care plan section) Acute Rehab OT Goals Patient Stated Goal: get stronger OT Goal Formulation: With patient Time For Goal Achievement: 07/07/18 ADL Goals Pt Will Perform Eating: with set-up;sitting Pt Will Perform Grooming: with set-up;sitting Pt Will Transfer to Toilet: with min assist;stand pivot  transfer;bedside commode Pt Will Perform Toileting - Clothing Manipulation and hygiene: with min assist;sit to/from stand  OT Frequency: Min 2X/week    AM-PAC PT "6 Clicks" Daily Activity     Outcome Measure Help from another person eating meals?: A Lot Help from another person taking care of personal grooming?: A Lot Help from another person toileting, which includes using toliet, bedpan, or urinal?: Total Help from another person bathing (including washing, rinsing, drying)?: A Lot Help from another person to put on and taking off regular upper body clothing?: A Lot Help from another person to put on and taking off regular lower body clothing?: Total 6 Click Score: 10   End of Session Nurse Communication: Mobility status  Activity Tolerance: Patient limited by lethargy Patient left: in bed  OT Visit Diagnosis:  Unsteadiness on feet (R26.81);Other abnormalities of gait and mobility (R26.89);Muscle weakness (generalized) (M62.81)                Time: 8719-5974 OT Time Calculation (min): 16 min Charges:  OT General Charges $OT Visit: 1 Visit OT Evaluation $OT Eval Moderate Complexity: 1 Mod  Kari Baars, [place] [phone] Office- [phone], Edwena Felty D 07/07/2018, 4:39 PM

## 2018-07-07 NOTE — Progress Notes (Signed)
CRITICAL VALUE ALERT  Critical Value:  Hemoglobin 6.5  Date & Time Notied:  07/07/18 0610  Provider Notified: Yes  Orders Received/Actions taken: Awaiting orders

## 2018-07-07 NOTE — Progress Notes (Signed)
[facility] PROGRESS NOTE Subjective Patient 4 days postop.  Has been extubated and they are actually getting her up moving around some.  Objective: Vital signs in last 24 hours: Temp:  [98.5 F (36.9 C)-100.1 F (37.8 C)] 98.5 F (36.9 C) (11/18 0355) Pulse Rate:  [39-153] 61 (11/18 0600) Resp:  [14-25] 20 (11/18 0600) BP: (120-162)/(39-74) 147/57 (11/18 0600) SpO2:  [96 %-100 %] 96 % (11/18 0600) Last BM Date: 07/06/18  Intake/Output from previous day: 11/17 0701 - 11/18 0700 In: 1938.6 [I.V.:1826.1; IV Piggyback:112.5] Out: 1260 [Urine:960; Drains:300] Intake/Output this shift: No intake/output data recorded.  PE: General--alert and responsive says she is still in pain but it is getting better  Abdomen--nondistended but quiet  Lab Results: Recent Labs    07/04/18 1510 07/05/18 0420 07/06/18 0228 07/07/18 0519  WBC 10.0 9.9 7.7 8.3  HGB 7.8* 7.4* 7.2* 6.5*  HCT 24.9* 23.3* 23.2* 21.2*  PLT 205 196 191 190   BMET Recent Labs    07/06/18 0315 07/07/18 0519  NA 137 138  K 3.6 3.4*  CL 104 107  CO2 28 26  CREATININE 0.52 0.49   LFT Recent Labs    07/06/18 0315 07/07/18 0519  PROT 4.3* 4.0*  AST 13* 12*  ALT 14 13  ALKPHOS 79 67  BILITOT 0.8 0.5   PT/INR No results for input(s): LABPROT, INR in the last 72 hours. PANCREAS No results for input(s): LIPASE in the last 72 hours.       Studies/Results: No results found.  Medications: I have reviewed the patient's current medications.  Assessment:   1.  Perforated sigmoid colon.  Status post left colectomy and transverse colostomy with fecal contamination on antibiotics. 2.  Chronic ulcerative colitis.  Has been followed at [facility] in the past but daughter states that she will now be seeing Dr. Michail Sermon here in [place] 3.  History of chronic atrial fibrillation.  Previously on Xarelto at home has been DNR in the past.   Plan: Discussed with daughter.  She will be following  up with Dr. Michail Sermon for treatment after discharge.  She is still recovering from her sigmoid colectomy.  We will follow every couple of days and make arrangements for follow-up with Dr. Michail Sermon when she is closer to discharge.   Nancy Fetter 07/07/2018, 8:25 AM  This note was created using voice recognition software. Minor errors may Have occurred unintentionally.  Pager: [phone] If no answer or after hours call [phone]

## 2018-07-07 NOTE — Progress Notes (Signed)
PHARMACY - ADULT TOTAL PARENTERAL NUTRITION CONSULT NOTE   Pharmacy Consult for TPN Indication: s/p sigmoid colon perforation repair  Patient Measurements: Height: 5\' 1"  (154.9 cm) Weight: 154 lb 15.7 oz (70.3 kg) IBW/kg (Calculated) : 47.8   Body mass index is 29.28 kg/m. Usual Weight:   Insulin Requirements: 4 units   Current Nutrition: NPO  IVF: NS at 30 ml/min  Central access: PICC TPN start date: 11/17  ASSESSMENT                                                                                                          HPI: Patient with PMH significant for ulcerative colitis, chronic afib on xarelto, CKD, HTN, and breast. Recently discharged from [facility] on 06/20/18 for UC flare and prescribed steroids. Presents this admission on 07/02/18 with abdominal cramping and constipation. CT abdomen showed large amount of free air.  Significant events:  11/14: colostomy and colectomy, intubated 11/15: extubated 11/17: start TPN  Today:    Glucose:  No hx DM, A1c 6.6 05/2018, however, steroid-induced hyperglycemia; hypoglycemic episodes 11/17 AM; CBGs now rising with start of TPN.  Electrolytes:  K low (3.4), Phos low (1.6). Others WNL including corrected Ca 9.4  Renal: SCr wnl/stable, UOP 960cc/24hr, I/O (+) 678cc  LFTs: WNL, albumin low (1.5)  TGs: 79 (11/18)  Prealbumin: 5.2 (11/18)  NUTRITIONAL GOALS                                                                                             RD recs: (11/15): 1650-1850 kcal, 80-90g protein per day  Custom TPN at goal rate of 75 ml/hr provides: 90 g/day protein (50 g/L) 45 g/day Lipid  (25 g/L) 306 g/day Dextrose (17 %) 1850 Kcal/day  PLAN                                                                                                                          Now: KCl 3meq x 4 runs per MD; Potassium phosphate 45mmol IV x 1  At 1800 today:  Increase TPN to 60 ml/hr. -  Provides 72 g of protein, 245 g of dextrose  which provides 1120  kCals per day, meeting 80 % of goal kcal and AA  Electrolytes in TPN: increase potassium to 55 meq/L and phosphate to 20 mmol/L, Cl:Ac ratio 1:1  Hold 20% lipid emulsion for first 7 days for ICU patients per ASPEN guidelines (Start date 07/13/18)  Plan to advance as tolerated to the goal rate.  TPN to contain standard multivitamins and trace elements.  Reduce IVF to 15 ml/hr.  Add/Change SSI .   TPN lab panels on Mondays & Thursdays.  F/u daily.   Peggyann Juba, PharmD, BCPS Pager: [phone] 07/07/2018,7:30 AM

## 2018-07-07 NOTE — Progress Notes (Signed)
Patient removed Nasogastric tube. Surgery PA notified. Verbal order to keep NGT out at this time. Will continue to monitor patient.

## 2018-07-07 NOTE — Progress Notes (Addendum)
Nutrition Follow-up  DOCUMENTATION CODES:   Not applicable  INTERVENTION:  - Continue TPN per Pharmacy. - Diet advancement and weaning of TPN as medically feasible.   NUTRITION DIAGNOSIS:   Inadequate oral intake related to decreased appetite as evidenced by per patient/family report. -ongoing, now on TPN  GOAL:   Patient will meet greater than or equal to 90% of their needs -to be met with TPN  MONITOR:   Weight trends, Labs, Skin, I & O's, Other (Comment)(TF initiation)  REASON FOR ASSESSMENT:   New TPN/TNA  ASSESSMENT:   Patient with PMH significant for ulcerative colitis, CKD, HTN, and breast cancer s/p lumpectomy. Recently discharged from [facility] on 11/1 for UC flare and prescribed steroids. Presents this admission with abdominal cramping and constipation.   Significant Events: 11/14- L colectomy with colostomy 11/15- extubated, NGT remains in place 11/17- TPN initiation   No weight since 11/15. Patient remains NPO and NGT remains in place, clamped. Per Will's note this AM, no stool in colostomy yet (POD #4) but plan to allow patient to have sips of clears.   Patient with single lumen PICC place in L brachial on 11/14 and double lumen CVC in R IJ placed 11/14.   Per Pharmacy note this AM, increase custom TPN to 60 mL/hr which will provide 1120 kcal and 72 grams of protein. Note indicates goal rate for custom TPN is 75 mL/hr which will provide 1850 kcal and 90 grams of protein. ILE on hold x7 days per ICU guidelines per ASPEN.    Medications reviewed; sliding scale Novolog, 20 mmol IV KPhos x1 run today. Labs reviewed;  CBGs: 171 and 167 mg/dL, K: 3.4 mmol/L, Ca: 7.4 mg/dL, Phos: 1.6 mg/dL.  IVF; NS @ 35 mL/hr.    Diet Order:   Diet Order            Diet NPO time specified Except for: Ice Chips  Diet effective now              EDUCATION NEEDS:   Not appropriate for education at this time  Skin:  Skin Assessment: Skin Integrity Issues: Skin Integrity  Issues:: Incisions Incisions: abdominal (11/14)  Last BM:  11/18  Height:   Ht Readings from Last 1 Encounters:  07/03/18 _0  (1.549 m)    Weight:   Wt Readings from Last 1 Encounters:  07/04/18 70.3 kg    Ideal Body Weight:  47.7 kg  BMI:  Body mass index is 29.28 kg/m.  Estimated Nutritional Needs:   Kcal:  1650-1850  Protein:  80-90 grams  Fluid:  >/= 1.7 L/day     Jarome Matin, MS, RD, LDN, [facility] Inpatient Clinical Dietitian Pager # [phone] After hours/weekend pager # [phone]

## 2018-07-08 LAB — BPAM RBC
BLOOD PRODUCT EXPIRATION DATE: 201912142359
Blood Product Expiration Date: 201912142359
ISSUE DATE / TIME: 201911181459
ISSUE DATE / TIME: 201911181313
Unit Type and Rh: 5100
Unit Type and Rh: 5100

## 2018-07-08 LAB — GLUCOSE, CAPILLARY
GLUCOSE-CAPILLARY: 179 mg/dL — AB (ref 70–99)
GLUCOSE-CAPILLARY: 170 mg/dL — AB (ref 70–99)
GLUCOSE-CAPILLARY: 172 mg/dL — AB (ref 70–99)
Glucose-Capillary: 165 mg/dL — ABNORMAL HIGH (ref 70–99)
Glucose-Capillary: 158 mg/dL — ABNORMAL HIGH (ref 70–99)

## 2018-07-08 LAB — COMPREHENSIVE METABOLIC PANEL
ALBUMIN: 1.5 g/dL — AB (ref 3.5–5.0)
ALK PHOS: 69 U/L (ref 38–126)
ALT: 12 U/L (ref 0–44)
AST: 12 U/L — AB (ref 15–41)
Anion gap: 6 (ref 5–15)
BILIRUBIN TOTAL: 0.5 mg/dL (ref 0.3–1.2)
BUN: 11 mg/dL (ref 8–23)
CALCIUM: 7.4 mg/dL — AB (ref 8.9–10.3)
CO2: 26 mmol/L (ref 22–32)
CREATININE: 0.44 mg/dL (ref 0.44–1.00)
Chloride: 103 mmol/L (ref 98–111)
GFR calc Af Amer: >60 mL/min (ref >60)
GLUCOSE: 171 mg/dL — AB (ref 70–99)
POTASSIUM: 4 mmol/L (ref 3.5–5.1)
Sodium: 135 mmol/L (ref 135–145)
TOTAL PROTEIN: 4.2 g/dL — AB (ref 6.5–8.1)

## 2018-07-08 LAB — TYPE AND SCREEN
ABO/RH(D): O POS
ANTIBODY SCREEN: NEGATIVE
Unit division: 0
Unit division: 0

## 2018-07-08 LAB — CBC
HEMATOCRIT: 25.4 % — AB (ref 36.0–46.0)
HEMOGLOBIN: 8.1 g/dL — AB (ref 12.0–15.0)
MCH: 29.2 pg (ref 26.0–34.0)
MCHC: 31.9 g/dL (ref 30.0–36.0)
MCV: 91.7 fL (ref 80.0–100.0)
NRBC: 0 % (ref 0.0–0.2)
Platelets: 183 10*3/uL (ref 150–400)
RBC: 2.77 MIL/uL — AB (ref 3.87–5.11)
RDW: 16.4 % — ABNORMAL HIGH (ref 11.5–15.5)
WBC: 12.1 10*3/uL — AB (ref 4.0–10.5)

## 2018-07-08 LAB — CULTURE, BLOOD (SINGLE)
CULTURE: NO GROWTH
SPECIAL REQUESTS: ADEQUATE

## 2018-07-08 LAB — MAGNESIUM: Magnesium: 1.9 mg/dL (ref 1.7–2.4)

## 2018-07-08 LAB — PHOSPHORUS: Phosphorus: 2.1 mg/dL — ABNORMAL LOW (ref 2.5–4.6)

## 2018-07-08 MED ORDER — POTASSIUM PHOSPHATES 15 MMOLE/5ML IV SOLN
10.0000 mmol | Freq: Once | INTRAVENOUS | Status: AC
  Administered 2018-07-08: 10 mmol via INTRAVENOUS
  Filled 2018-07-08: qty 3.33

## 2018-07-08 MED ORDER — ACETAMINOPHEN 10 MG/ML IV SOLN
1000.0000 mg | Freq: Four times a day (QID) | INTRAVENOUS | Status: AC
  Administered 2018-07-08 – 2018-07-09 (×4): 1000 mg via INTRAVENOUS
  Filled 2018-07-08 (×4): qty 100

## 2018-07-08 MED ORDER — TRAVASOL 10 % IV SOLN
INTRAVENOUS | Status: AC
  Administered 2018-07-08: 18:00:00 via INTRAVENOUS
  Filled 2018-07-08: qty 900

## 2018-07-08 MED ORDER — METHOCARBAMOL 1000 MG/10ML IJ SOLN
500.0000 mg | Freq: Three times a day (TID) | INTRAVENOUS | Status: DC
  Administered 2018-07-08 – 2018-07-11 (×10): 500 mg via INTRAVENOUS
  Filled 2018-07-08: qty 500
  Filled 2018-07-08: qty 5
  Filled 2018-07-08 (×9): qty 500

## 2018-07-08 NOTE — Progress Notes (Signed)
5 Days Post-Op    CC: Abdominal pain/rectal bleeding  Subjective: Patient is sitting up in bed but having a good deal of discomfort this a.m.  She is not doing her incentive spirometry secondary to pain.  She got out of bed once yesterday. Her NG came out yesterday but she has had no nausea since the tube came out. Her ostomy looks fine but there is still nothing but some sweat in the bag.  No flatus.  She remains on TNA. She required morphine 6 times yesterday, and has had 2 doses this a.m. so far.  Objective: Vital signs in last 24 hours: Temp:  [97.7 F (36.5 C)-98.8 F (37.1 C)] 97.9 F (36.6 C) (11/19 0347) Pulse Rate:  [39-146] 64 (11/19 0600) Resp:  [16-37] 19 (11/19 0600) BP: (127-172)/(20-116) 127/40 (11/19 0600) SpO2:  [88 %-99 %] 97 % (11/19 0600) Last BM Date: 07/06/18 NG came out yesterday and not replaced Nothing PO recorded 2355 IV Urine 1695 375 drain Stool x 1 Afebrile, VSS CMP OK Transfused yesterday H/H is up; WBC is also up No film today   Intake/Output from previous day: 11/18 0701 - 11/19 0700 In: 2355.3 [I.V.:1444.1; Blood:315; IV Piggyback:596.2] Out: 2070 [Urine:1695; Drains:375] Intake/Output this shift: Total I/O In: 1780.3 [I.V.:1184.1; IV Piggyback:596.2] Out: 8315 [Urine:1295; Drains:200]  General appearance: alert, cooperative and mild distress Resp: clear to auscultation bilaterally and Anterior exam GI: Soft, few bowel sounds, no flatus or stool in the bag.  She does have some sweat in the bag.  Midline incision looks fine.  JP drain is serosanguineous with fair amount of leakage around the JP drain site.  Lab Results:  Recent Labs    07/07/18 0519 07/07/18 2000 07/08/18 0603  WBC 8.3  --  12.1*  HGB 6.5* 8.3* 8.1*  HCT 21.2* 25.4* 25.4*  PLT 190  --  183    BMET Recent Labs    07/07/18 0519 07/08/18 0603  NA 138 135  K 3.4* 4.0  CL 107 103  CO2 26 26  GLUCOSE 173* 171*  BUN 10 11  CREATININE 0.49 0.44  CALCIUM  7.4* 7.4*   PT/INR No results for input(s): LABPROT, INR in the last 72 hours.  Recent Labs  Lab 07/02/18 0348 07/03/18 0142 07/06/18 0315 07/07/18 0519 07/08/18 0603  AST 14* 16 13* 12* 12*  ALT _0 ALKPHOS 84 88 79 67 69  BILITOT 0.7 0.3 0.8 0.5 0.5  PROT 4.6* 5.0* 4.3* 4.0* 4.2*  ALBUMIN 2.0* 2.1* 1.7* 1.5* 1.5*     Lipase     Component Value Date/Time   LIPASE 16 06/13/2018 1754     Medications: . sodium chloride   Intravenous Once  . chlorhexidine gluconate (MEDLINE KIT)  15 mL Mouth Rinse BID  . Chlorhexidine Gluconate Cloth  6 each Topical Daily  . enoxaparin (LOVENOX) injection  40 mg Subcutaneous Q24H  . insulin aspart  0-9 Units Subcutaneous Q4H  . metoprolol tartrate  5 mg Intravenous Q6H  . pantoprazole (PROTONIX) IV  40 mg Intravenous Daily  . sodium chloride flush  10-40 mL Intracatheter Q12H    Assessment/Plan  Acute hypoxic respiratory failure -extubated 11/15 HX severe colitis -Zosyn day 4 Hypertension History of acute on  chronic anemia - H/H 6.5/21 this AM History of chronic atrial fibrillation -on Xarelto at home DNR Severe Malnutrition - prealbumin 5.2 - TNA per Medicine   Perforated sigmoid colon with fecal contamination Left colectomy, and transverse colostomy, takedown  splenic flexure, 07/03/2018 Dr. Rolm Bookbinder POD #4  FEN: TPN/n.p.o. ID: Rocephin/Flagyl 11/6 - 11/8; Zosyn 11/14 =>> day 6 DVT: Lovenox Follow up:  Dr. Donne Hazel  Plan: Add IV Tylenol for pain control.  Add IV Robaxin, DC her Foley later today.  Continue to encourage mobilization, bedside commode for voiding.    LOS: 13 days    Vanita Cannell 07/08/2018 [phone]

## 2018-07-08 NOTE — Progress Notes (Signed)
Occupational Therapy Treatment Patient Details Name: Felicia Carlson MRN: [id number] DOB: 04-15-1925 Today's Date: 07/08/2018    History of present illness 84 yo female admitted with ulcerative colitis,  transferred to the ICU after undergoing left colectomy; end-transverse colostomy; and takedown of splenic flexure on 07/03/18, Extubated 11/15; PMHx: chest pain, abnormal ECG. hx of breast ca, UC, CKD   OT comments  Pt in pain but did agree to BUE AROM,.  This seemed to get her mind off her pain. RN also gave pain meds.  Pt so pleasant.   Follow Up Recommendations  SNF    Equipment Recommendations  None recommended by OT    Recommendations for Other Services      Precautions / Restrictions Precautions Precautions: Fall Precaution Comments: JP drain, multiple lines Restrictions Weight Bearing Restrictions: No       Mobility Bed Mobility               General bed mobility comments: NT  Transfers                 General transfer comment: NT        ADL either performed or assessed with clinical judgement   ADL Overall ADL's : Needs assistance/impaired                                       General ADL Comments: Pt in bed and having pain but did agree to BUE AROM. Bertram Millard, RN present for OT session     Vision Baseline Vision/History: No visual deficits            Cognition Arousal/Alertness: Awake/alert Behavior During Therapy: WFL for tasks assessed/performed Overall Cognitive Status: No family/caregiver present to determine baseline cognitive functioning                         Following Commands: Follows one step commands consistently                Exercises General Exercises - Upper Extremity Shoulder Flexion: AROM;Both;20 reps;Supine Elbow Flexion: AROM;Both;20 reps;Supine Elbow Extension: AROM;Both;20 reps Wrist Flexion: AROM;Both;Supine Wrist Extension: AROM;Both;Supine Digit Composite Flexion:  AROM;Both;Supine Composite Extension: AROM;Both;15 reps   Shoulder Instructions       General Comments      Pertinent Vitals/ Pain       Pain Score: 8  Pain Location: abdomen and back Pain Descriptors / Indicators: Grimacing;Sore Pain Intervention(s): Limited activity within patient's tolerance;Monitored during session;RN gave pain meds during session     Prior Functioning/Environment              Frequency  Min 2X/week        Progress Toward Goals  OT Goals(current goals can now be found in the care plan section)  Progress towards OT goals: Progressing toward goals     Plan Discharge plan remains appropriate    Co-evaluation                 AM-PAC PT "6 Clicks" Daily Activity     Outcome Measure   Help from another person eating meals?: A Lot Help from another person taking care of personal grooming?: A Lot Help from another person toileting, which includes using toliet, bedpan, or urinal?: Total Help from another person bathing (including washing, rinsing, drying)?: A Lot Help from another person to put on and taking off regular upper body  clothing?: A Lot Help from another person to put on and taking off regular lower body clothing?: Total 6 Click Score: 10    End of Session    OT Visit Diagnosis: Unsteadiness on feet (R26.81);Other abnormalities of gait and mobility (R26.89);Muscle weakness (generalized) (M62.81)   Activity Tolerance Patient limited by pain   Patient Left in bed;with nursing/sitter in room   Nurse Communication Mobility status        Time: 2761-4709 OT Time Calculation (min): 12 min  Charges: OT General Charges $OT Visit: 1 Visit OT Treatments $Therapeutic Exercise: 8-22 mins  Kari Baars, [place] [phone] Office- [place]den Singac, Edwena Felty D 07/08/2018, 4:31 PM

## 2018-07-08 NOTE — Consult Note (Signed)
[place] Nurse ostomy follow up: POD #5 Stoma type/location: RUQ transverse colostomy Stomal assessment/size: 2 inch oval, medial edge is very close to midline wound Peristomal assessment: intact Treatment options for stomal/peristomal skin: skin barrier ring Output: small amount of serosanguinous effluent in pouch. Ostomy pouching: 2pc.pouching system with skin barrier ring Education provided: None today. Teaching will be performed when patient is more stable and is out of ICU. Enrolled patient in [place] Start Discharge program: No   WOC nursing team will follow, and will remain available to this patient, the nursing, surgical and medical teams.   Thanks, Maudie Flakes, MSN, RN, [place], Arther Abbott  Pager# [phone]

## 2018-07-08 NOTE — Progress Notes (Signed)
Physical Therapy Treatment Patient Details Name: Felicia Carlson MRN: [id number] DOB: 12-06-1924 Today's Date: 07/08/2018    History of Present Illness 84 yo female admitted with ulcerative colitis,  transferred to the ICU after undergoing left colectomy; end-transverse colostomy; and takedown of splenic flexure on 07/03/18, Extubated 11/15; PMHx: chest pain, abnormal ECG. hx of breast ca, UC, CKD    PT Comments    The patient did stand and pivot to recliner with 2 max assist. Patient is very quiet. Does endorse abdominal pain. Patient had medication prior.   Follow Up Recommendations  SNF     Equipment Recommendations  None recommended by PT    Recommendations for Other Services       Precautions / Restrictions Precautions Precautions: Fall Precaution Comments: JP drain, multiple lines, ostomy Restrictions Weight Bearing Restrictions: No    Mobility  Bed Mobility Overal bed mobility: Needs Assistance Bed Mobility: Rolling;Sidelying to Sit Rolling: Mod assist;+2 for safety/equipment Sidelying to sit: Mod assist;+2 for safety/equipment;HOB elevated       General bed mobility comments: assist with legs and trunk to sitting upright  Transfers Overall transfer level: Needs assistance Equipment used: Rolling walker (2 wheeled);None Transfers: Sit to/from Stand Sit to Stand: +2 safety/equipment;Max assist;+2 physical assistance Stand pivot transfers: Max assist;+2 physical assistance;+2 safety/equipment       General transfer comment: max assist to stand with bear hug, 2 assis to shuffle to recliner and lower to sitting. patient did bear some weight on the legs when standing.  Ambulation/Gait                 Stairs             Wheelchair Mobility    Modified Rankin (Stroke Patients Only)       Balance                                            Cognition Arousal/Alertness: Awake/alert Behavior During Therapy: WFL for tasks  assessed/performed Overall Cognitive Status: No family/caregiver present to determine baseline cognitive functioning Area of Impairment: Following commands;Problem solving                       Following Commands: Follows one step commands consistently       General Comments: pt verbalizes very little this session, voice is very soft, keeps eyes closed but is alert      Exercises General Exercises - Upper Extremity Shoulder Flexion: AROM;Both;20 reps;Supine Elbow Flexion: AROM;Both;20 reps;Supine Elbow Extension: AROM;Both;20 reps Wrist Flexion: AROM;Both;Supine Wrist Extension: AROM;Both;Supine Digit Composite Flexion: AROM;Both;Supine Composite Extension: AROM;Both;15 reps    General Comments        Pertinent Vitals/Pain Pain Score: 8  Faces Pain Scale: Hurts whole lot Pain Location: abdomen and back Pain Descriptors / Indicators: Grimacing;Sore Pain Intervention(s): Monitored during session;Premedicated before session    Home Living                      Prior Function            PT Goals (current goals can now be found in the care plan section) Progress towards PT goals: Progressing toward goals    Frequency    Min 2X/week      PT Plan Current plan remains appropriate    Co-evaluation  AM-PAC PT "6 Clicks" Daily Activity  Outcome Measure  Difficulty turning over in bed (including adjusting bedclothes, sheets and blankets)?: Unable Difficulty moving from lying on back to sitting on the side of the bed? : Unable Difficulty sitting down on and standing up from a chair with arms (e.g., wheelchair, bedside commode, etc,.)?: Unable Help needed moving to and from a bed to chair (including a wheelchair)?: Total Help needed walking in hospital room?: Total Help needed climbing 3-5 steps with a railing? : Total 6 Click Score: 6    End of Session   Activity Tolerance: Patient limited by fatigue Patient left: in chair;with  call bell/phone within reach;with nursing/sitter in room Nurse Communication: Mobility status PT Visit Diagnosis: Muscle weakness (generalized) (M62.81);Difficulty in walking, not elsewhere classified (R26.2)     Time: 5885-0277 PT Time Calculation (min) (ACUTE ONLY): 21 min  Charges:  $Therapeutic Activity: 8-22 mins                     Tresa Endo PT Acute Rehabilitation Services Pager [phone] Office [phone]     Claretha Cooper 07/08/2018, 5:13 PM

## 2018-07-08 NOTE — Care Management Note (Signed)
Case Management Note  Patient Details  Name: Felicia Carlson MRN: [id number] Date of Birth: November 04, 1924  Subjective/Objective:                  5 Days Post-Op  Acute hypoxic respiratory failure-extubated 11/15 HXsevere colitis -Zosyn day 4 Hypertension History ofacute onchronic anemia - H/H 6.5/21 this AM History of chronic atrial fibrillation-on Xarelto at home DNR Severe Malnutrition - prealbumin 5.2 - TNA per Medicine Perforated sigmoid colon with fecal contamination Left colectomy, and transverse colostomy, takedown splenic flexure, 07/03/2018 Dr. Rolm Bookbinder POD #4 Action/Plan: Will follow for progression of care and clinical status. Will follow for case management needs none present at this time. Is active with [facility] this admission. Expected Discharge Date:                  Expected Discharge Plan:  [place]  In-House Referral:     Discharge planning Services  CM Consult  Post Acute Care Choice:  Resumption of Svcs/PTA Provider Choice offered to:     DME Arranged:    DME Agency:     HH Arranged:  OT, PT [place] Agency:  [place]  Status of Service:  In process, will continue to follow  If discussed at Long Length of Stay Meetings, dates discussed:    Additional Comments:  Leeroy Cha, RN 07/08/2018, 10:30 AM

## 2018-07-08 NOTE — Progress Notes (Addendum)
PHARMACY - ADULT TOTAL PARENTERAL NUTRITION CONSULT NOTE   Pharmacy Consult for TPN Indication: s/p sigmoid colon perforation repair  Patient Measurements: Height: 5\' 1"  (154.9 cm) Weight: 154 lb 15.7 oz (70.3 kg) IBW/kg (Calculated) : 47.8   Body mass index is 29.28 kg/m.  Insulin Requirements: 10 units   Current Nutrition: NPO, except sips of clears  IVF: NS at 15 ml/min  Central access: PICC TPN start date: 11/17  ASSESSMENT                                                                                                          HPI: Patient with PMH significant for ulcerative colitis, chronic afib on xarelto (not taking PTA), CKD, HTN, and breast cancer. Recently discharged from [facility] on [facility]/20/18 for UC flare and prescribed steroids. Presents this admission on 07/02/18 with abdominal cramping and constipation. CT abdomen showed large amount of free air.  Significant events:  11/14: colostomy and colectomy, intubated 11/15: extubated 11/17: start TPN 11/18 NG tube removed by patient.  No nausea, but essentially no ostomy output, no flatus.  Today:    Glucose:  No hx DM, A1c 6.6 05/2018, however, steroid-induced hyperglycemia; hypoglycemic episodes 11/17 AM.  CBGs now increasing on TPN (range 107-179)  Electrolytes:  Phos low but improved (2.1), others WNL including corrected Ca 9.4  Renal: SCr wnl/stable, UOP 1695 mL/24hr, I/O (+) 285 /24 hrs (+10.6 L this admission)  LFTs: WNL, albumin low (1.5)  TGs: 79 (11/18)  Prealbumin: 5.2 (11/18)  NUTRITIONAL GOALS                                                                                             RD recs: (11/18): 1650-1850 kcal, 80-90g protein per day  Custom TPN at goal rate of 75 ml/hr provides: 90 g/day protein (50 g/L) 45 g/day Lipid  (25 g/L) 306 g/day Dextrose (17 %) 1850 Kcal/day  PLAN                                                                                                                           Now: Potassium phosphate 10 mmol IV x 1  At  1800 today:  Increase TPN to goal rate of 75 ml/hr. -  Provides 90 g of protein, 306 g of dextrose which provides 1850 kCals per day, meeting 100 % of goal kcal and AA  Electrolytes in TPN: continue increase potassium 55 meq/L and phosphate to 20 mmol/L, Cl:Ac ratio 1:1  Hold 20% lipid emulsion for first 7 days for ICU patients per ASPEN guidelines (Start date 07/13/18)  TPN to contain standard multivitamins and trace elements.  Reduce IVF to [facility].  Continue sensitive SSI .   TPN lab panels on Mondays & Thursdays.  BMET, Mag, Phos daily until stabilized.  F/u daily.   Gretta Arab PharmD, BCPS Pager [phone] 07/08/2018 7:58 AM

## 2018-07-08 NOTE — Progress Notes (Signed)
PROGRESS NOTE  Felicia Carlson LRJ:[id number] DOB: 23-Nov-1924 DOA: 06/25/2018 PCP: Lajean Manes, MD  HPI/Brief Narrative  Felicia Carlson is a 84 y.o. year old female with medical history significant for ulcerative colitis, chronic A. fib on Xarelto who presented on 06/25/2018 with bright red blood per rectum, anorexia and abdominal pain x 1 weekafter recent discharge from hospital on 11/1 for ulcerative colitis flare requiring steroids and was found to have severe left-sided ulcerative colitis on 11/7 by flex sigmoidoscopy.During hospital stay she was treated with IV Solu-Medrol with transition to oral prednisone.  Patient was awaiting starting a biologic such as Remicade pending negative TB and CMV testing.  On 11/13 patient had worsening abdominal pain prompting CT abdomen which showed large amount of free air.  Initial review showed no extravasation of oral contrast and non-toxic appearing patient per chart review.  Was not felt that patient required emergent surgery at time of CT abdomen given patient's clinical status and patient was hesitant about proceeding with surgery; however, patient's clinical status deteriorated on 11/14 with tachycardia, decreased oxygen saturation and much more tender on abdominal exam for which surgery recommended exploratory laparotomy.  Patient underwent colostomy and colectomy on 11/14.  After surgery she was monitored in ICU and intubated.  Extubated on 70/76 with no complications.  Started TPN on 11/17 via PICC  Subjective Having more pain this morning.  Requiring IV morphine.  Assessment/Plan:  #Perforated sigmoid colon status post left colectomy and transverse colostomy (11/14). Perforation likely in relation to severe ulcerative colitis. Tolerating TPN, continue to monitor for bowel function, appreciate surgery recommendations, continue IV Zosyn for presumed intra-abdominal infection given perforated bowel and exposure to GI contents, day 7 will be 11/20.Marland Kitchen  Pain  control with IV morphine 1 mg PRN severe pain, surgery added IV Robaxin and IV Tylenol.. IV antiemetics PRN  #Acute hypoxic respiratory failure. Intubated 11/14 perioperatively, extubated 11/15. Only on 2-3L [place] currently. Wean oxygen to room air with goal SPO2 greater than 92%. Incentive spirometer, OOB to chair and mobilizing with nursing assistance  #Severe Ulcerative colitis. Reflected in flex sig on 11/7 and persistent thickening of bowel wall on CT abd imaging on 11/13. Surgery would like to hold on steroids given high risk for abdominal abscess and rectal stump issues. Per GI, not a candidate for anti-TNF given likely abdominal infection. Will follow with Dr. Michail Sermon as outpatient, GI following peripherally here.  #Hypertension.  At goal, last BP 124/45.   monitor, IV metoprolol PRN. Holding home amlodipine and Toprol given n.p.o.  #Acute on chronic anemia,  blood loss anemia secondary to hematochezia from UC and worsened from recent surgery. Hgb stable at ~8 after blood transfusion on 11/18 (previous baseline 8-9), no gross bleeding currently. Of note,was previously on xarelto. serial CBC, transfuse goal > 7  #Chronic atrial fibrillation.  Chads VASC 5.  Xarelto was started on last hospital stay on 11/1.  Patient only took for 1 day and was stopped as an outpatient due to hematochezia related to her colitis.  We have been holding throughout hospital stay.  Per discussion with [name] Plan on 11/19 she will prefer we hold off on any further use of Xarelto, she understands the risk for potential stroke with this plan and would like to continue only prophylactic subcutaneous dosing with Lovenox while monitoring her blood counts.  -No xarelto since surgery, discussed with daughter on 11/19 who does not want to restart, she is aware of risk for stroke more concerned with bleeding  risk currently. -Currently on lovenox subcutaneous, daughter wants to continue this plan    #Hyperglycemia. Likely  steroid induced. A1c 6.6 ( 05/2018). NPO for now until bowel function.  Monitor CBG, sliding scale as needed   Code Status: changed from Full Code back to DNR after discussion with her daughter [name] Plan on 11/16.  She confirms her mother would not want chest compressions or intubation. ( it was full Code before to allow for intubation after her surgery)  Family Communication: Updated daughter on phone Disposition Plan: IV pain control, monitor bowel function, monitor on TPN, Zosyn to complete on 11/20, likely transfer to floor on 11/20     Consultants:  GI, Surgery  Procedures:  11/7- flex sig  11/14 -bowel perforation repair   Antimicrobials: Anti-infectives (From admission, onward)   Start     Dose/Rate Route Frequency Ordered Stop   07/03/18 2200  piperacillin-tazobactam (ZOSYN) IVPB 3.375 g     3.375 g 12.5 mL/hr over 240 Minutes Intravenous Every 8 hours 07/03/18 2034     07/03/18 1200  piperacillin-tazobactam (ZOSYN) IVPB 3.375 g  Status:  Discontinued     3.375 g 12.5 mL/hr over 240 Minutes Intravenous Every 8 hours 07/03/18 0444 07/03/18 2012   07/03/18 0445  piperacillin-tazobactam (ZOSYN) IVPB 3.375 g     3.375 g 100 mL/hr over 30 Minutes Intravenous  Once 07/03/18 0440 07/03/18 0640   06/25/18 1600  cefTRIAXone (ROCEPHIN) 2 g in sodium chloride 0.9 % 100 mL IVPB  Status:  Discontinued     2 g 200 mL/hr over 30 Minutes Intravenous Every 24 hours 06/25/18 1546 06/27/18 1348   06/25/18 1600  metroNIDAZOLE (FLAGYL) IVPB 500 mg  Status:  Discontinued     500 mg 100 mL/hr over 60 Minutes Intravenous Every 8 hours 06/25/18 1546 06/27/18 1348        Cultures:  none  Telemetry:yes  DVT prophylaxis: lovenox for now until hemoglobin stable then consider heparin gtt   Objective: Vitals:   07/08/18 0720 07/08/18 0800 07/08/18 1100 07/08/18 1200  BP:  132/67  (!) 124/45  Pulse:  77  (!) 143  Resp:  (!) 23  (!) 21  Temp: 97.9 F (36.6 C)  98.1 F (36.7 C)    TempSrc: Oral  Oral   SpO2:  98%  99%  Weight:      Height:        Intake/Output Summary (Last 24 hours) at 07/08/2018 1438 Last data filed at 07/08/2018 1033 Gross per 24 hour  Intake 2345.31 ml  Output 2130 ml  Net 215.31 ml   Filed Weights   07/03/18 1900 07/04/18 0400  Weight: 70 kg 70.3 kg    Exam:  Constitutional: elderly female, lying in bed in pain but no acute distress Cardiovascular: Irregularly irregular rhythm no MRGs, with no peripheral edema Respiratory: Normal respiratory effort on 3 L and clear breat sounds Abdomen: Soft, colostomy bag in place, abdominal dressing clean and intact, no bowel sounds Neurologic: Grossly no focal neuro deficit. Psychiatric: Answering questions about pain. Oriented to person, place, context  Data Reviewed: CBC: Recent Labs  Lab 07/03/18 0142  07/04/18 1510 07/05/18 0420 07/06/18 0228 07/07/18 0519 07/07/18 2000 07/08/18 0603  WBC 5.5   < > 10.0 9.9 7.7 8.3  --  12.1*  NEUTROABS 4.5  --   --   --   --  6.7  --   --   HGB 8.9*   < > 7.8* 7.4* 7.2* 6.5* 8.3* 8.1*  HCT 28.6*   < > 24.9* 23.3* 23.2* 21.2* 25.4* 25.4*  MCV 96.3   < > 93.6 95.9 95.5 96.4  --  91.7  PLT 154   < > 205 196 191 190  --  183   < > = values in this interval not displayed.   Basic Metabolic Panel: Recent Labs  Lab 07/03/18 0142 07/04/18 0349 07/06/18 0315 07/07/18 0519 07/08/18 0603  NA 132* 135 137 138 135  K 4.7 4.7 3.6 3.4* 4.0  CL 96* 98 104 107 103  CO2 '27 29 28 26 26  ' GLUCOSE 227* 222* 82 173* 171*  BUN '21 16 11 10 11  ' CREATININE 0.73 0.69 0.52 0.49 0.44  CALCIUM 8.7* 7.6* 7.6* 7.4* 7.4*  MG  --   --  2.0 1.9 1.9  PHOS  --   --  2.4* 1.6* 2.1*   GFR: Estimated Creatinine Clearance: 41.9 mL/min (by C-G formula based on SCr of 0.44 mg/dL). Liver Function Tests: Recent Labs  Lab 07/02/18 0348 07/03/18 0142 07/06/18 0315 07/07/18 0519 07/08/18 0603  AST 14* 16 13* 12* 12*  ALT '12 13 14 13 12  ' ALKPHOS 84 88 79 67 69    BILITOT 0.7 0.3 0.8 0.5 0.5  PROT 4.6* 5.0* 4.3* 4.0* 4.2*  ALBUMIN 2.0* 2.1* 1.7* 1.5* 1.5*   No results for input(s): LIPASE, AMYLASE in the last 168 hours. No results for input(s): AMMONIA in the last 168 hours. Coagulation Profile: No results for input(s): INR, PROTIME in the last 168 hours. Cardiac Enzymes: No results for input(s): CKTOTAL, CKMB, CKMBINDEX, TROPONINI in the last 168 hours. BNP (last 3 results) No results for input(s): PROBNP in the last 8760 hours. HbA1C: No results for input(s): HGBA1C in the last 72 hours. CBG: Recent Labs  Lab 07/07/18 1929 07/07/18 2339 07/08/18 0334 07/08/18 0737 07/08/18 1103  GLUCAP 157* 178* 165* 179* 170*   Lipid Profile: Recent Labs    07/07/18 0519  TRIG 79   Thyroid Function Tests: No results for input(s): TSH, T4TOTAL, FREET4, T3FREE, THYROIDAB in the last 72 hours. Anemia Panel: No results for input(s): VITAMINB12, FOLATE, FERRITIN, TIBC, IRON, RETICCTPCT in the last 72 hours. Urine analysis:    Component Value Date/Time   COLORURINE STRAW (A) 06/25/2018 1658   APPEARANCEUR CLEAR 06/25/2018 1658   LABSPEC 1.005 06/25/2018 1658   PHURINE 7.0 06/25/2018 1658   GLUCOSEU NEGATIVE 06/25/2018 1658   HGBUR NEGATIVE 06/25/2018 1658   BILIRUBINUR NEGATIVE 06/25/2018 1658   KETONESUR NEGATIVE 06/25/2018 1658   PROTEINUR NEGATIVE 06/25/2018 1658   UROBILINOGEN 0.2 11/02/2010 0300   NITRITE NEGATIVE 06/25/2018 1658   LEUKOCYTESUR NEGATIVE 06/25/2018 1658   Sepsis Labs: '@LABRCNTIP' (procalcitonin:4,lacticidven:4)  ) Recent Results (from the past 240 hour(s))  Culture, blood (single)     Status: None (Preliminary result)   Collection Time: 07/03/18  1:42 AM  Result Value Ref Range Status   Specimen Description   Final    BLOOD LEFT HAND Performed at [facility], [place] [address], [place], [place] [address]    Special Requests   Final    BOTTLES DRAWN AEROBIC ONLY Blood Culture adequate  volume Performed at [facility] [address], [place], [place] [address]    Culture   Final    NO GROWTH 4 DAYS Performed at [facility], [place] [address] [place], [place] [address]    Report Status PENDING  Incomplete  MRSA PCR Screening     Status:  None   Collection Time: 07/03/18  7:33 PM  Result Value Ref Range Status   MRSA by PCR NEGATIVE NEGATIVE Final    Comment:        The GeneXpert MRSA Assay (FDA approved for NASAL specimens only), is one component of a comprehensive MRSA colonization surveillance program. It is not intended to diagnose MRSA infection nor to guide or monitor treatment for MRSA infections. Performed at [facility], [place] [address] [place], [place] [address]       Studies: No results found.  Scheduled Meds: . sodium chloride   Intravenous Once  . chlorhexidine gluconate (MEDLINE KIT)  15 mL Mouth Rinse BID  . Chlorhexidine Gluconate Cloth  6 each Topical Daily  . enoxaparin (LOVENOX) injection  40 mg Subcutaneous Q24H  . insulin aspart  0-9 Units Subcutaneous Q4H  . metoprolol tartrate  5 mg Intravenous Q6H  . pantoprazole (PROTONIX) IV  40 mg Intravenous Daily  . sodium chloride flush  10-40 mL Intracatheter Q12H    Continuous Infusions: . sodium chloride 10 mL/hr at 07/08/18 1049  . acetaminophen Stopped (07/08/18 1227)  . methocarbamol (ROBAXIN) IV Stopped (07/08/18 0957)  . piperacillin-tazobactam (ZOSYN)  IV Stopped (07/08/18 0956)  . potassium PHOSPHATE IVPB (in mmol) 10 mmol (07/08/18 1103)  . TPN ADULT (ION) 60 mL/hr at 07/08/18 0600  . TPN ADULT (ION)       LOS: 13 days     Desiree Hane, MD [facility] Pager [phone]  If 7PM-7AM, please contact night-coverage [email or web address] Password [facility] 07/08/2018, 2:38 PM

## 2018-07-09 DIAGNOSIS — K51019 Ulcerative (chronic) pancolitis with unspecified complications: Secondary | ICD-10-CM

## 2018-07-09 DIAGNOSIS — K631 Perforation of intestine (nontraumatic): Secondary | ICD-10-CM

## 2018-07-09 DIAGNOSIS — D649 Anemia, unspecified: Secondary | ICD-10-CM

## 2018-07-09 DIAGNOSIS — D62 Acute posthemorrhagic anemia: Secondary | ICD-10-CM

## 2018-07-09 DIAGNOSIS — Z7901 Long term (current) use of anticoagulants: Secondary | ICD-10-CM

## 2018-07-09 LAB — COMPREHENSIVE METABOLIC PANEL
ALBUMIN: 1.4 g/dL — AB (ref 3.5–5.0)
ALK PHOS: 70 U/L (ref 38–126)
ALT: 10 U/L (ref 0–44)
ANION GAP: 3 — AB (ref 5–15)
AST: 12 U/L — ABNORMAL LOW (ref 15–41)
BILIRUBIN TOTAL: 0.6 mg/dL (ref 0.3–1.2)
BUN: 14 mg/dL (ref 8–23)
CALCIUM: 7.3 mg/dL — AB (ref 8.9–10.3)
CO2: 27 mmol/L (ref 22–32)
CREATININE: 0.46 mg/dL (ref 0.44–1.00)
Chloride: 100 mmol/L (ref 98–111)
GFR calc Af Amer: >60 mL/min (ref >60)
GFR calc non Af Amer: >60 mL/min (ref >60)
Glucose, Bld: 237 mg/dL — ABNORMAL HIGH (ref 70–99)
Potassium: 4 mmol/L (ref 3.5–5.1)
Sodium: 130 mmol/L — ABNORMAL LOW (ref 135–145)
Total Protein: 4 g/dL — ABNORMAL LOW (ref 6.5–8.1)

## 2018-07-09 LAB — GLUCOSE, CAPILLARY
GLUCOSE-CAPILLARY: 170 mg/dL — AB (ref 70–99)
GLUCOSE-CAPILLARY: 148 mg/dL — AB (ref 70–99)
Glucose-Capillary: 112 mg/dL — ABNORMAL HIGH (ref 70–99)
Glucose-Capillary: 124 mg/dL — ABNORMAL HIGH (ref 70–99)
Glucose-Capillary: 152 mg/dL — ABNORMAL HIGH (ref 70–99)
Glucose-Capillary: 159 mg/dL — ABNORMAL HIGH (ref 70–99)
Glucose-Capillary: 194 mg/dL — ABNORMAL HIGH (ref 70–99)

## 2018-07-09 LAB — PHOSPHORUS: PHOSPHORUS: 2.7 mg/dL (ref 2.5–4.6)

## 2018-07-09 LAB — MAGNESIUM: Magnesium: 2 mg/dL (ref 1.7–2.4)

## 2018-07-09 MED ORDER — TRAVASOL 10 % IV SOLN
INTRAVENOUS | Status: AC
  Administered 2018-07-09: 18:00:00 via INTRAVENOUS
  Filled 2018-07-09: qty 900

## 2018-07-09 MED ORDER — INSULIN GLARGINE 100 UNIT/ML ~~LOC~~ SOLN: 5.0000 [IU] | Freq: Every day | SUBCUTANEOUS | Status: DC

## 2018-07-09 MED ORDER — INSULIN ASPART 100 UNIT/ML ~~LOC~~ SOLN
0.0000 [IU] | SUBCUTANEOUS | Status: DC
  Administered 2018-07-09: 3 [IU] via SUBCUTANEOUS
  Administered 2018-07-09: 2 [IU] via SUBCUTANEOUS
  Administered 2018-07-09: 3 [IU] via SUBCUTANEOUS
  Administered 2018-07-10 (×2): 2 [IU] via SUBCUTANEOUS
  Administered 2018-07-10 (×2): 3 [IU] via SUBCUTANEOUS
  Administered 2018-07-11 (×4): 2 [IU] via SUBCUTANEOUS
  Administered 2018-07-12: 3 [IU] via SUBCUTANEOUS
  Administered 2018-07-12: 2 [IU] via SUBCUTANEOUS
  Administered 2018-07-13 (×2): 3 [IU] via SUBCUTANEOUS
  Administered 2018-07-13 (×2): 2 [IU] via SUBCUTANEOUS
  Administered 2018-07-13 – 2018-07-14 (×2): 3 [IU] via SUBCUTANEOUS
  Administered 2018-07-14 (×3): 2 [IU] via SUBCUTANEOUS
  Administered 2018-07-15: 3 [IU] via SUBCUTANEOUS
  Administered 2018-07-15: 2 [IU] via SUBCUTANEOUS

## 2018-07-09 MED ORDER — ORAL CARE MOUTH RINSE
15.0000 mL | Freq: Two times a day (BID) | OROMUCOSAL | Status: DC
  Administered 2018-07-10 – 2018-07-15 (×10): 15 mL via OROMUCOSAL

## 2018-07-09 MED ORDER — ACETAMINOPHEN 10 MG/ML IV SOLN
1000.0000 mg | Freq: Four times a day (QID) | INTRAVENOUS | Status: AC
  Administered 2018-07-09 – 2018-07-10 (×4): 1000 mg via INTRAVENOUS
  Filled 2018-07-09 (×4): qty 100

## 2018-07-09 MED ORDER — CHLORHEXIDINE GLUCONATE 0.12 % MT SOLN
15.0000 mL | Freq: Two times a day (BID) | OROMUCOSAL | Status: DC
  Administered 2018-07-09 – 2018-07-15 (×11): 15 mL via OROMUCOSAL
  Filled 2018-07-09 (×8): qty 15

## 2018-07-09 MED ORDER — MORPHINE SULFATE (PF) 4 MG/ML IV SOLN
4.0000 mg | INTRAVENOUS | Status: DC | PRN
  Administered 2018-07-09 – 2018-07-11 (×12): 4 mg via INTRAVENOUS
  Filled 2018-07-09 (×13): qty 1

## 2018-07-09 MED ORDER — METOPROLOL TARTRATE 5 MG/5ML IV SOLN
10.0000 mg | Freq: Four times a day (QID) | INTRAVENOUS | Status: DC
  Administered 2018-07-09 – 2018-07-13 (×15): 10 mg via INTRAVENOUS
  Filled 2018-07-09 (×17): qty 10

## 2018-07-09 NOTE — Progress Notes (Signed)
[facility] PROGRESS NOTE    Progress Note  Felicia Carlson  QXI:[id number] DOB: 07-09-1925 DOA: 06/25/2018 PCP: Lajean Manes, MD     Brief Narrative:   Felicia Carlson is an 84 y.o. female past medical history significant of ulcerative colitis, chronic atrial fibrillation on Xarelto, recently discharged on 08/20/2017 for ulcerative colitis flares placed on steroids a flex sig during that time showed left-sided ulcerative colitis presented on 06/25/2018 for bright red blood per rectum and abdominal pain for a week, on 07/02/2018 patient had worsening abdominal pain a CT scan of the abdomen and pelvis showed free air, initially it showed no extravasation of oral contrast and the patient was nontoxic-appearing per chart, during this time the patient was felt not to require emergent surgery, initially also the patient was hesitant about proceeding with surgery however she deteriorated on 07/03/2018 at which time surgery recommended emergent exploratory laparotomy and when colectomy with colostomy on 07/03/2018  Assessment/Plan:   Pneumoperitoneum perforation of sigmoid: Status post left colectomy, colostomy on 07/03/2018: Likely due to ulcerative colitis. Currently on TPN, IV Zosyn for presumed intra-abdominal infection, and date will be on 07/09/2018. Further management per surgery and GI. She relates her pain is not controlled increase narcotics.  Acute hypoxic respiratory failure with hypoxia: Intubated.Marland Kitchen  Definitely on 07/03/2018, status post extubation on 07/04/2018. Currently on nasal oxygen saturating above 90%. Continue incentive spirometry.  Severe ulcerative colitis: Flex sig on 06/26/2018 showed persistent thickening of the abdominal wall. Surgery recommended to hold steroids given high risk of abdominal abscess and stump. Per GI patient not a candidate for anti-TNF.  Follow-up with Dr. Michail Sermon as an outpatient.  Irregular rhythm: Patient heart rate is ranging from 89-102 with a  regular patterns. We will increase Toprol all.  Severe lcerative colitis with complication [facility]): Surgery recommended to hold steroids given the high risk of abscesses due to perforation. She not a candidate for anti-TNF.  Essential hypertension: Blood pressure is high. We will continue IV metoprolol scheduled.  Chronic atrial fibrillation: Chads VASC 5, holding Xarelto. Was discussed with family and will continue Xarelto, they understand the potential risk and benefits. We will continue Lovenox subcutaneously.  Acute blood loss anemia/ Acute on chronic anemia Globin has been stable ranging from 8.5-8.1. We will continue to monitor closely.  Hyperglycemia: Likely due to steroids, she is n.p.o. and TPN. RN Pressure Injury Documentation:    Estimated body mass index is 29.28 kg/m as calculated from the following:   Height as of this encounter: _0  (1.549 m).   Weight as of this encounter: 70.3 kg. Malnutrition Type:  Nutrition Problem: Inadequate oral intake Etiology: decreased appetite   Malnutrition Characteristics:  Signs/Symptoms: per patient/family report   Nutrition Interventions:  Interventions: Refer to RD note for recommendations    DVT prophylaxis: lovenox Family Communication:none Disposition Plan/Barrier to D/C: unable to determine Code Status:     Code Status Orders  (From admission, onward)         Start     Ordered   07/05/18 1038  Do not attempt resuscitation (DNR)  Continuous    Question Answer Comment  In the event of cardiac or respiratory ARREST Do not call a "code blue"   In the event of cardiac or respiratory ARREST Do not perform Intubation, CPR, defibrillation or ACLS   In the event of cardiac or respiratory ARREST Use medication by any route, position, wound care, and other measures to relive pain and suffering. May use oxygen, suction and  manual treatment of airway obstruction as needed for comfort.      07/05/18 1038          Code Status History    Date Active Date Inactive Code Status Order ID Comments User Context   07/03/2018 2012 07/05/2018 1038 Full Code [id number]  Rolm Bookbinder, MD Inpatient   06/25/2018 1825 07/03/2018 2012 DNR [id number]  [name], MD Inpatient   06/25/2018 1825 06/25/2018 1825 Full Code [id number]  [name], MD Inpatient   06/13/2018 2323 06/20/2018 1619 DNR [id number]  [name], MD Inpatient   10/26/2014 1912 10/30/2014 1814 Full Code [id number]  [name], MD Inpatient    Advance Directive Documentation     Most Recent Value  Type of Advance Directive  Healthcare Power of Attorney  Pre-existing out of facility DNR order (yellow form or pink MOST form)  -  "MOST" Form in Place?  -        IV Access:    Peripheral IV   Procedures and diagnostic studies:   No results found.   Medical Consultants:    None.  Anti-Infectives:   Zosyn  Subjective:    Felicia Carlson she relates her pain is not controlled.  Objective:    Vitals:   07/08/18 1945 07/08/18 2000 07/09/18 0000 07/09/18 0400  BP:  (!) 177/60 (!) 145/73 (!) 158/52  Pulse:  95 (!) 54 77  Resp:  (!) 22 (!) 23 18  Temp: 98.3 F (36.8 C)  98.1 F (36.7 C) 97.7 F (36.5 C)  TempSrc: Oral  Oral Oral  SpO2:  100% 100% 99%  Weight:      Height:        Intake/Output Summary (Last 24 hours) at 07/09/2018 0715 Last data filed at 07/09/2018 0409 Gross per 24 hour  Intake 3381.06 ml  Output 1610 ml  Net 1771.06 ml   Filed Weights   07/03/18 1900 07/04/18 0400  Weight: 70 kg 70.3 kg    Exam: General exam: In no acute distress. Respiratory system: Good air movement and clear to auscultation. Cardiovascular system: S1 & S2 heard, RRR. Gastrointestinal system: Abdomen is nondistended, soft and nontender.  Central nervous system: Alert and oriented. No focal neurological deficits. Extremities: No pedal edema. Skin: No rashes, lesions or ulcers Psychiatry:  Judgement and insight appear normal. Mood & affect appropriate.    Data Reviewed:    Labs: Basic Metabolic Panel: Recent Labs  Lab 07/04/18 0349 07/06/18 0315 07/07/18 0519 07/08/18 0603 07/09/18 0539  NA 135 137 138 135 130*  K 4.7 3.6 3.4* 4.0 4.0  CL 98 104 107 103 100  CO2 _0 GLUCOSE 222* 82 173* 171* 237*  BUN _1 CREATININE 0.69 0.52 0.49 0.44 0.46  CALCIUM 7.6* 7.6* 7.4* 7.4* 7.3*  MG  --  2.0 1.9 1.9 2.0  PHOS  --  2.4* 1.6* 2.1* 2.7   GFR Estimated Creatinine Clearance: 41.9 mL/min (by C-G formula based on SCr of 0.46 mg/dL). Liver Function Tests: Recent Labs  Lab 07/03/18 0142 07/06/18 0315 07/07/18 0519 07/08/18 0603 07/09/18 0539  AST 16 13* 12* 12* 12*  ALT _2 ALKPHOS 88 79 67 69 70  BILITOT 0.3 0.8 0.5 0.5 0.6  PROT 5.0* 4.3* 4.0* 4.2* 4.0*  ALBUMIN 2.1* 1.7* 1.5* 1.5* 1.4*   No results for input(s): LIPASE, AMYLASE in the last 168 hours. No results for input(s): AMMONIA in  the last 168 hours. Coagulation profile No results for input(s): INR, PROTIME in the last 168 hours.  CBC: Recent Labs  Lab 07/03/18 0142  07/04/18 1510 07/05/18 0420 07/06/18 0228 07/07/18 0519 07/07/18 2000 07/08/18 0603  WBC 5.5   < > 10.0 9.9 7.7 8.3  --  12.1*  NEUTROABS 4.5  --   --   --   --  6.7  --   --   HGB 8.9*   < > 7.8* 7.4* 7.2* 6.5* 8.3* 8.1*  HCT 28.6*   < > 24.9* 23.3* 23.2* 21.2* 25.4* 25.4*  MCV 96.3   < > 93.6 95.9 95.5 96.4  --  91.7  PLT 154   < > 205 196 191 190  --  183   < > = values in this interval not displayed.   Cardiac Enzymes: No results for input(s): CKTOTAL, CKMB, CKMBINDEX, TROPONINI in the last 168 hours. BNP (last 3 results) No results for input(s): PROBNP in the last 8760 hours. CBG: Recent Labs  Lab 07/08/18 1103 07/08/18 1546 07/08/18 2108 07/09/18 0000 07/09/18 0336  GLUCAP 170* 158* 172* 124* 152*   D-Dimer: No results for input(s): DDIMER in the last 72 hours. Hgb  A1c: No results for input(s): HGBA1C in the last 72 hours. Lipid Profile: Recent Labs    07/07/18 0519  TRIG 79   Thyroid function studies: No results for input(s): TSH, T4TOTAL, T3FREE, THYROIDAB in the last 72 hours.  Invalid input(s): FREET3 Anemia work up: No results for input(s): VITAMINB12, FOLATE, FERRITIN, TIBC, IRON, RETICCTPCT in the last 72 hours. Sepsis Labs: Recent Labs  Lab 07/05/18 0420 07/06/18 0228 07/07/18 0519 07/08/18 0603  WBC 9.9 7.7 8.3 12.1*   Microbiology Recent Results (from the past 240 hour(s))  Culture, blood (single)     Status: None   Collection Time: 07/03/18  1:42 AM  Result Value Ref Range Status   Specimen Description   Final    BLOOD LEFT HAND Performed at [facility], [place] [address] [place], [place] [address]    Special Requests   Final    BOTTLES DRAWN AEROBIC ONLY Blood Culture adequate volume Performed at [place] [address] [place], [place] [address]    Culture   Final    NO GROWTH 5 DAYS Performed at [facility], [place] [address] [place], [place] [address]    Report Status 07/08/2018 FINAL  Final  MRSA PCR Screening     Status: None   Collection Time: 07/03/18  7:33 PM  Result Value Ref Range Status   MRSA by PCR NEGATIVE NEGATIVE Final    Comment:        The GeneXpert MRSA Assay (FDA approved for NASAL specimens only), is one component of a comprehensive MRSA colonization surveillance program. It is not intended to diagnose MRSA infection nor to guide or monitor treatment for MRSA infections. Performed at [facility], [place] [address], [place], [place] [address]      Medications:   . sodium chloride   Intravenous Once  . chlorhexidine gluconate (MEDLINE KIT)  15 mL Mouth Rinse BID  . Chlorhexidine Gluconate Cloth  6 each Topical Daily  . enoxaparin (LOVENOX) injection  40 mg Subcutaneous Q24H  . insulin aspart  0-9 Units Subcutaneous  Q4H  . metoprolol tartrate  5 mg Intravenous Q6H  . pantoprazole (PROTONIX) IV  40 mg Intravenous Daily  . sodium chloride flush  10-40 mL Intracatheter Q12H  Continuous Infusions: . sodium chloride 10 mL/hr at 07/08/18 1049  . methocarbamol (ROBAXIN) IV Stopped (07/09/18 0409)  . piperacillin-tazobactam (ZOSYN)  IV 12.5 mL/hr at 07/09/18 0409  . TPN ADULT (ION) 75 mL/hr at 07/09/18 0409      LOS: 14 days   [name]  [facility]   *Please refer to [email or web address], password [email or web address] to get updated schedule on who will round on this patient, as hospitalists switch teams weekly. If 7PM-7AM, please contact night-coverage at [email or web address], password [email or web address] for any overnight needs.  07/09/2018, 7:15 AM

## 2018-07-09 NOTE — Care Management Important Message (Signed)
Important Message  Patient Details  Name: Felicia Carlson MRN: [id number] Date of Birth: Apr 26, 1925   Medicare Important Message Given:  Yes    [name] 07/09/2018, 12:04 PM

## 2018-07-09 NOTE — Progress Notes (Signed)
OT Cancellation Note  Patient Details Name: Felicia Carlson MRN: [id number] DOB: [birth year]   Cancelled Treatment:    Reason Eval/Treat Not Completed: Medical issues which prohibited therapy;Pain limiting ability to participate.  Spoke to [employer].  Pt just went into AFib which they are trying to control.  Pain is also very bad at this time.  May check this pm, if schedule permits.  Sanari Offner 07/09/2018, 8:13 AM  Lesle Chris, OTR/L Acute Rehabilitation Services [phone] [facility] pager [phone] office 07/09/2018

## 2018-07-09 NOTE — Progress Notes (Signed)
[facility] PROGRESS NOTE Subjective Pt more alert. Still NPO  Objective: Vital signs in last 24 hours: Temp:  [97.6 F (36.4 C)-98.3 F (36.8 C)] 97.6 F (36.4 C) (11/20 0800) Pulse Rate:  [54-143] 77 (11/20 0400) Resp:  [18-23] 18 (11/20 0400) BP: (124-177)/(45-73) 158/52 (11/20 0400) SpO2:  [99 %-100 %] 99 % (11/20 0400) Last BM Date: 07/06/18  Intake/Output from previous day: 11/19 0701 - 11/20 0700 In: 3605.7 [I.V.:2785.9; IV Piggyback:819.8] Out: 2235 [Urine:1845; Drains:390] Intake/Output this shift: No intake/output data recorded.  PE: General--alert blowing into breathing apparatus  Abdomen--quiet  Lab Results: Recent Labs    07/07/18 0519 07/07/18 2000 07/08/18 0603  WBC 8.3  --  12.1*  HGB 6.5* 8.3* 8.1*  HCT 21.2* 25.4* 25.4*  PLT 190  --  183   BMET Recent Labs    07/07/18 0519 07/08/18 0603 07/09/18 0539  NA 138 135 130*  K 3.4* 4.0 4.0  CL 107 103 100  CO2 26 26 27   CREATININE 0.49 0.44 0.46   LFT Recent Labs    07/07/18 0519 07/08/18 0603 07/09/18 0539  PROT 4.0* 4.2* 4.0*  AST 12* 12* 12*  ALT 13 12 10   ALKPHOS 67 69 70  BILITOT 0.5 0.5 0.6   PT/INR No results for input(s): LABPROT, INR in the last 72 hours. PANCREAS No results for input(s): LIPASE in the last 72 hours.       Studies/Results: No results found.  Medications: I have reviewed the patient's current medications.  Assessment:   1. Ulcerative Colitis with perforation. Path shows perforated diverticulum with ischemia. Previous biopsies from sigmoid showed marked ulcers consistent with UC.    Plan: 1. Daughter wishes for pt to see Dr Michail Sermon in future for GI care. When she is able to take PO would start her on mesalamine I.e. Lialda 2.4 gms daily and have her f/u in office with Dr Michail Sermon to adjust. We will not follow daily but let us know if we need to see her again this admission.   Nancy Fetter 07/09/2018, 8:53 AM  This note was  created using voice recognition software. Minor errors may Have occurred unintentionally.  Pager: [phone] If no answer or after hours call [phone]

## 2018-07-09 NOTE — Progress Notes (Signed)
6 Days Post-Op    CC:abdominal pain/rectal bleeding  Subjective: She is awake, IV Tylenol going, pain fairly well controlled.  No BS, she does have some gas in the bag today.  Midline not packed to the base and some fat like necrosis at the base in the midline, not much, but this is new.  She is irregular on the telemetry, she cannot get the IS up past about 250 and she still isn't really comprehending how to do this.    Objective: Vital signs in last 24 hours: Temp:  [97.6 F (36.4 C)-98.3 F (36.8 C)] 97.6 F (36.4 C) (11/20 0800) Pulse Rate:  [54-143] 77 (11/20 0400) Resp:  [18-23] 18 (11/20 0400) BP: (124-177)/(45-73) 158/52 (11/20 0400) SpO2:  [99 %-100 %] 99 % (11/20 0400) Last BM Date: 07/06/18 3605 IV 1845 urine  390 drain Nothing from the ostomy recorded Afebrile, VSS Na 130 Glucose 237  Intake/Output from previous day: 11/19 0701 - 11/20 0700 In: 3605.7 [I.V.:2785.9; IV Piggyback:819.8] Out: 2235 [Urine:1845; Drains:390] Intake/Output this shift: No intake/output data recorded.  General appearance: alert, cooperative and no distress Resp: clear to auscultation bilaterally and anterior exam, on [place] O2,  GI: soft, sore, open wound as noted above with some early fat necrosis at the base.  Ostomy still swollen but looks OK, she has some gas in the bag this AM and that is also new.  No BS  Lab Results:  Recent Labs    07/07/18 0519 07/07/18 2000 07/08/18 0603  WBC 8.3  --  12.1*  HGB 6.5* 8.3* 8.1*  HCT 21.2* 25.4* 25.4*  PLT 190  --  183    BMET Recent Labs    07/08/18 0603 07/09/18 0539  NA 135 130*  K 4.0 4.0  CL 103 100  CO2 26 27  GLUCOSE 171* 237*  BUN 11 14  CREATININE 0.44 0.46  CALCIUM 7.4* 7.3*   PT/INR No results for input(s): LABPROT, INR in the last 72 hours.  Recent Labs  Lab 07/03/18 0142 07/06/18 0315 07/07/18 0519 07/08/18 0603 07/09/18 0539  AST 16 13* 12* 12* 12*  ALT 13 14 13 12 10  ALKPHOS 88 79 67 69 70  BILITOT  0.3 0.8 0.5 0.5 0.6  PROT 5.0* 4.3* 4.0* 4.2* 4.0*  ALBUMIN 2.1* 1.7* 1.5* 1.5* 1.4*     Lipase     Component Value Date/Time   LIPASE 16 06/13/2018 1754     Medications: . sodium chloride   Intravenous Once  . chlorhexidine gluconate (MEDLINE KIT)  15 mL Mouth Rinse BID  . Chlorhexidine Gluconate Cloth  6 each Topical Daily  . enoxaparin (LOVENOX) injection  40 mg Subcutaneous Q24H  . insulin aspart  0-9 Units Subcutaneous Q4H  . metoprolol tartrate  10 mg Intravenous Q6H  . pantoprazole (PROTONIX) IV  40 mg Intravenous Daily  . sodium chloride flush  10-40 mL Intracatheter Q12H    Assessment/Plan Acute hypoxic respiratory failure-extubated 11/15 HXsevere ulcerative colitis -Zosyn day 4 Hypertension History ofacute onchronic anemia - H/H 6.5/21 this AM History of chronic atrial fibrillation-on Xarelto at home DNR Severe Malnutrition - prealbumin 5.2 - TNA per Medicine   Perforated sigmoid colon with fecal contamination Left colectomy, and transverse colostomy, takedown splenic flexure, 07/03/2018 Dr. Matthew Wakefield POD #5 Post op ileus  FEN: TPN/n.p.o. ID: Rocephin/Flagyl 11/6 - 11/8;Zosyn 11/14 =>> day 6 DVT: Lovenox Follow up: Dr. Wakefield  Plan:  I will recheck CBC tomorrow and hopefully stop abx.  She needs   to be up more and RT needs to help her do the IS correctly.  Make slow progress with ileus.  Continue TNA.  I increased dressing changes and will ask them to be sure to get to the base.         LOS: 14 days    Randolf Sansoucie 07/09/2018 [phone]

## 2018-07-09 NOTE — Progress Notes (Signed)
PHARMACY - ADULT TOTAL PARENTERAL NUTRITION CONSULT NOTE   Pharmacy Consult for TPN Indication: s/p sigmoid colon perforation repair, postop ileus  Patient Measurements: Height: 5\' 1"  (154.9 cm) Weight: 154 lb 15.7 oz (70.3 kg) IBW/kg (Calculated) : 47.8   Body mass index is 29.28 kg/m.  Insulin Requirements: 11 units   Current Nutrition: NPO, except sips of clears  IVF: NS at [facility] access: PICC TPN start date: 11/17  ASSESSMENT                                                                                                          HPI: Patient with PMH significant for ulcerative colitis, chronic afib on xarelto (not taking PTA), CKD, HTN, and breast cancer. Recently discharged from [facility] on 06/20/18 for UC flare and prescribed steroids. Presents this admission on 07/02/18 with abdominal cramping and constipation. CT abdomen showed large amount of free air.  Significant events:  11/14: colostomy and colectomy, intubated 11/15: extubated 11/17: start TPN 11/18 NG tube removed by patient.  No nausea, but essentially no ostomy output, no flatus. 11/20 Patient is SD-ICU status, start lipids in TPN today.  Today:    Glucose:  No hx DM, A1c 6.6 05/2018, however, steroid-induced hyperglycemia.  CBGs increasing on TPN (range 124-194) with glucose 237 on CMET.  Monitor closely, consider adding insulin to TPN if using > 10 units /day.  Electrolytes: Na low (130), others WNL including corrected Ca   Renal: SCr wnl/stable, UOP 1695 mL/24hr, I/O (+) 285 /24 hrs (+10.6 L this admission)  LFTs: WNL, albumin low (1.5)  TGs: 79 (11/18)  Prealbumin: 5.2 (11/18)  NUTRITIONAL GOALS                                                                                             RD recs: (11/18): 1650-1850 kcal, 80-90g protein per day  Custom TPN at goal rate of 75 ml/hr provides: 90 g/day protein (50 g/L) 45 g/day Lipid  (25 g/L) 306 g/day Dextrose (17 %) 1850 Kcal/day    PLAN  At 1800 today:  ContinueTPN at goal rate of 75 ml/hr. -  Provides 90 g of protein, 306 g of dextrose, 45g Lipids which provides 1850 kCals per day, meeting 100 % of goal kcal and AA  Electrolytes in TPN: continue increase potassium 55 meq/L and phosphate to 20 mmol/L, Cl:Ac ratio 1:1  TPN to contain standard multivitamins and trace elements.  [facility]F at [facility].  Increase to moderate SSI q4h.   TPN lab panels on Mondays & Thursdays.   F/u daily.   Gretta Arab PharmD, BCPS Pager [phone] 07/09/2018 8:30 AM

## 2018-07-10 ENCOUNTER — Inpatient Hospital Stay: Payer: Self-pay

## 2018-07-10 ENCOUNTER — Inpatient Hospital Stay (HOSPITAL_COMMUNITY): Payer: Medicare HMO

## 2018-07-10 DIAGNOSIS — K659 Peritonitis, unspecified: Secondary | ICD-10-CM

## 2018-07-10 LAB — COMPREHENSIVE METABOLIC PANEL
ALT: 9 U/L (ref 0–44)
AST: 10 U/L — AB (ref 15–41)
Albumin: 1.3 g/dL — ABNORMAL LOW (ref 3.5–5.0)
Alkaline Phosphatase: 70 U/L (ref 38–126)
Anion gap: 6 (ref 5–15)
BUN: 20 mg/dL (ref 8–23)
CHLORIDE: 99 mmol/L (ref 98–111)
CO2: 26 mmol/L (ref 22–32)
Calcium: 7.3 mg/dL — ABNORMAL LOW (ref 8.9–10.3)
Creatinine, Ser: 0.41 mg/dL — ABNORMAL LOW (ref 0.44–1.00)
GFR calc Af Amer: >60 mL/min (ref >60)
GLUCOSE: 274 mg/dL — AB (ref 70–99)
Potassium: 4.3 mmol/L (ref 3.5–5.1)
Sodium: 131 mmol/L — ABNORMAL LOW (ref 135–145)
Total Bilirubin: 0.4 mg/dL (ref 0.3–1.2)
Total Protein: 4 g/dL — ABNORMAL LOW (ref 6.5–8.1)

## 2018-07-10 LAB — CBC
HEMATOCRIT: 24.7 % — AB (ref 36.0–46.0)
HEMOGLOBIN: 7.9 g/dL — AB (ref 12.0–15.0)
MCH: 29.9 pg (ref 26.0–34.0)
MCHC: 32 g/dL (ref 30.0–36.0)
MCV: 93.6 fL (ref 80.0–100.0)
NRBC: 0 % (ref 0.0–0.2)
PLATELETS: 181 10*3/uL (ref 150–400)
RBC: 2.64 MIL/uL — AB (ref 3.87–5.11)
RDW: 16.3 % — ABNORMAL HIGH (ref 11.5–15.5)
WBC: 12.6 10*3/uL — ABNORMAL HIGH (ref 4.0–10.5)

## 2018-07-10 LAB — GLUCOSE, CAPILLARY
GLUCOSE-CAPILLARY: 142 mg/dL — AB (ref 70–99)
GLUCOSE-CAPILLARY: 124 mg/dL — AB (ref 70–99)
Glucose-Capillary: 181 mg/dL — ABNORMAL HIGH (ref 70–99)
Glucose-Capillary: 109 mg/dL — ABNORMAL HIGH (ref 70–99)
Glucose-Capillary: 92 mg/dL (ref 70–99)
Glucose-Capillary: 152 mg/dL — ABNORMAL HIGH (ref 70–99)

## 2018-07-10 LAB — MAGNESIUM: MAGNESIUM: 2.1 mg/dL (ref 1.7–2.4)

## 2018-07-10 LAB — PHOSPHORUS: Phosphorus: 3.1 mg/dL (ref 2.5–4.6)

## 2018-07-10 MED ORDER — FAMOTIDINE IN NACL 20-0.9 MG/50ML-% IV SOLN
20.0000 mg | INTRAVENOUS | Status: DC
  Administered 2018-07-10: 20 mg via INTRAVENOUS
  Filled 2018-07-10: qty 50

## 2018-07-10 MED ORDER — FAMOTIDINE IN NACL 20-0.9 MG/50ML-% IV SOLN: 20.0000 mg | Freq: Two times a day (BID) | INTRAVENOUS | Status: DC

## 2018-07-10 MED ORDER — SODIUM CHLORIDE 0.9 % IV SOLN
INTRAVENOUS | Status: DC
  Administered 2018-07-10 (×2): via INTRAVENOUS
  Filled 2018-07-10 (×3): qty 1000

## 2018-07-10 MED ORDER — TRAVASOL 10 % IV SOLN
INTRAVENOUS | Status: AC
  Filled 2018-07-10: qty 900

## 2018-07-10 MED ORDER — INSULIN GLARGINE 100 UNIT/ML ~~LOC~~ SOLN
5.0000 [IU] | Freq: Every day | SUBCUTANEOUS | Status: DC
  Administered 2018-07-10 – 2018-07-14 (×5): 5 [IU] via SUBCUTANEOUS
  Filled 2018-07-10 (×6): qty 0.05

## 2018-07-10 NOTE — Progress Notes (Signed)
[facility] PROGRESS NOTE    Progress Note  Felicia Carlson  KVQ:[id number] DOB: Dec 10, 1924 DOA: 06/25/2018 PCP: Lajean Manes, MD     Brief Narrative:   Felicia Carlson is an 84 y.o. female past medical history significant of ulcerative colitis, chronic atrial fibrillation on Xarelto, recently discharged on 08/20/2017 for ulcerative colitis flares placed on steroids a flex sig during that time showed left-sided ulcerative colitis presented on 06/25/2018 for bright red blood per rectum and abdominal pain for a week, on 07/02/2018 patient had worsening abdominal pain a CT scan of the abdomen and pelvis showed free air, initially it showed no extravasation of oral contrast and the patient was nontoxic-appearing per chart, during this time the patient was felt not to require emergent surgery, initially also the patient was hesitant about proceeding with surgery however she deteriorated on 07/03/2018 at which time surgery recommended emergent exploratory laparotomy and when colectomy with colostomy on 07/03/2018  Assessment/Plan:   Pneumoperitoneum perforation of sigmoid: Status post left colectomy, colostomy on 07/03/2018: Likely due to ulcerative colitis. Currently on TPN, IV Zosyn for presumed intra-abdominal infection, and date will be on 07/10/2018. Further management per surgery.. She relates her pain is controlled. Physical therapy evaluated the patient recommended skilled nursing facility was physical therapy and Occupational Therapy to continue to work with patient.  Acute hypoxic respiratory failure with hypoxia: Intubated on 07/03/2018, status post extubation on 07/04/2018. Currently on nasal oxygen saturating above 90%. Continue incentive spirometry.  Severe ulcerative colitis: Flex sig on 06/26/2018 showed persistent thickening of the abdominal wall. Continue to hold steroids per surgery recommendations. Per GI patient not a candidate for anti-TNF.  Follow-up with Dr. Michail Sermon as  an outpatient. GI is following and they recommended to start oral mesalamine and Lialda 2.4 gms daily   Irregular rhythm: Heart rate is improved ranging around 70s, still irregular, not actually A. fib but with PACs.  Essential hypertension: Blood pressure is high. We will continue IV metoprolol scheduled.  Chronic atrial fibrillation: Chads VASC 5, holding Xarelto. Was discussed with family and will discontinue Xarelto, they understand the potential risk and benefits. We will continue Lovenox subcutaneously.  Acute blood loss anemia/ Acute on chronic anemia Globin has been stable ranging from 8.5-8.1. We will continue to monitor closely.  Hyperglycemia: Likely due to steroids, she is n.p.o. and TPN.  Hematuria: Question traumatic we will continue to monitor continue IV fluids.  RN Pressure Injury Documentation:    Estimated body mass index is 29.28 kg/m as calculated from the following:   Height as of this encounter: 5\' 1"  (1.549 m).   Weight as of this encounter: 70.3 kg. Malnutrition Type:  Nutrition Problem: Inadequate oral intake Etiology: decreased appetite   Malnutrition Characteristics:  Signs/Symptoms: per patient/family report   Nutrition Interventions:  Interventions: Refer to RD note for recommendations    DVT prophylaxis: lovenox Family Communication:none Disposition Plan/Barrier to D/C: unable to determine Code Status:     Code Status Orders  (From admission, onward)         Start     Ordered   07/05/18 1038  Do not attempt resuscitation (DNR)  Continuous    Question Answer Comment  In the event of cardiac or respiratory ARREST Do not call a "code blue"   In the event of cardiac or respiratory ARREST Do not perform Intubation, CPR, defibrillation or ACLS   In the event of cardiac or respiratory ARREST Use medication by any route, position, wound care, and other measures  to relive pain and suffering. May use oxygen, suction and manual  treatment of airway obstruction as needed for comfort.      07/05/18 1038        Code Status History    Date Active Date Inactive Code Status Order ID Comments User Context   07/03/2018 2012 07/05/2018 1038 Full Code [id number]  Rolm Bookbinder, MD Inpatient   06/25/2018 1825 07/03/2018 2012 DNR [id number]  [name], MD Inpatient   06/25/2018 1825 06/25/2018 1825 Full Code [id number]  [name], MD Inpatient   06/13/2018 2323 06/20/2018 1619 DNR [id number]  [name], MD Inpatient   10/26/2014 1912 10/30/2014 1814 Full Code [id number]  [name], MD Inpatient    Advance Directive Documentation     Most Recent Value  Type of Advance Directive  Healthcare Power of Attorney  Pre-existing out of facility DNR order (yellow form or pink MOST form)  -  "MOST" Form in Place?  -        IV Access:    Peripheral IV   Procedures and diagnostic studies:   No results found.   Medical Consultants:    None.  Anti-Infectives:   Zosyn  Subjective:    Felicia Carlson relates her pain is  controlled.  He feels better than yesterday Objective:    Vitals:   07/10/18 0250 07/10/18 0323 07/10/18 0341 07/10/18 0630  BP:      Pulse: 69 74  68  Resp: 19 15  (!) 27  Temp:   98.5 F (36.9 C)   TempSrc:   Oral   SpO2: 100% 100%  100%  Weight:      Height:        Intake/Output Summary (Last 24 hours) at 07/10/2018 0704 Last data filed at 07/10/2018 0649 Gross per 24 hour  Intake 2147.27 ml  Output 1800 ml  Net 347.27 ml   Filed Weights   07/03/18 1900 07/04/18 0400  Weight: 70 kg 70.3 kg    Exam: General exam: In no acute distress. Respiratory system: Good air movement and clear to auscultation. Cardiovascular system: S1 & S2 heard, RRR. Gastrointestinal system: Abdomen is nondistended, soft ostomy in place. Central nervous system: Alert and oriented. No focal neurological deficits. Extremities: No pedal edema. Skin: No rashes,  lesions or ulcers Psychiatry: Judgement and insight appear normal. Mood & affect appropriate.    Data Reviewed:    Labs: Basic Metabolic Panel: Recent Labs  Lab 07/06/18 0315 07/07/18 0519 07/08/18 0603 07/09/18 0539 07/10/18 0534  NA 137 138 135 130* 131*  K 3.6 3.4* 4.0 4.0 4.3  CL 104 107 103 100 99  CO2 28 26 26 27 26   GLUCOSE 82 173* 171* 237* 274*  BUN 11 10 11 14 20   CREATININE 0.52 0.49 0.44 0.46 0.41*  CALCIUM 7.6* 7.4* 7.4* 7.3* 7.3*  MG 2.0 1.9 1.9 2.0 2.1  PHOS 2.4* 1.6* 2.1* 2.7 3.1   GFR Estimated Creatinine Clearance: 41.9 mL/min (A) (by C-G formula based on SCr of 0.41 mg/dL (L)). Liver Function Tests: Recent Labs  Lab 07/06/18 0315 07/07/18 0519 07/08/18 0603 07/09/18 0539 07/10/18 0534  AST 13* 12* 12* 12* 10*  ALT 14 13 12 10 9   ALKPHOS 79 67 69 70 70  BILITOT 0.8 0.5 0.5 0.6 0.4  PROT 4.3* 4.0* 4.2* 4.0* 4.0*  ALBUMIN 1.7* 1.5* 1.5* 1.4* 1.3*   No results for input(s): LIPASE, AMYLASE in the last 168 hours. No results for input(s): AMMONIA in  the last 168 hours. Coagulation profile No results for input(s): INR, PROTIME in the last 168 hours.  CBC: Recent Labs  Lab 07/05/18 0420 07/06/18 0228 07/07/18 0519 07/07/18 2000 07/08/18 0603 07/10/18 0534  WBC 9.9 7.7 8.3  --  12.1* 12.6*  NEUTROABS  --   --  6.7  --   --   --   HGB 7.4* 7.2* 6.5* 8.3* 8.1* 7.9*  HCT 23.3* 23.2* 21.2* 25.4* 25.4* 24.7*  MCV 95.9 95.5 96.4  --  91.7 93.6  PLT 196 191 190  --  183 181   Cardiac Enzymes: No results for input(s): CKTOTAL, CKMB, CKMBINDEX, TROPONINI in the last 168 hours. BNP (last 3 results) No results for input(s): PROBNP in the last 8760 hours. CBG: Recent Labs  Lab 07/09/18 1154 07/09/18 1609 07/09/18 2004 07/09/18 2352 07/10/18 0504  GLUCAP 170* 148* 159* 112* 181*   D-Dimer: No results for input(s): DDIMER in the last 72 hours. Hgb A1c: No results for input(s): HGBA1C in the last 72 hours. Lipid Profile: No results for  input(s): CHOL, HDL, LDLCALC, TRIG, CHOLHDL, LDLDIRECT in the last 72 hours. Thyroid function studies: No results for input(s): TSH, T4TOTAL, T3FREE, THYROIDAB in the last 72 hours.  Invalid input(s): FREET3 Anemia work up: No results for input(s): VITAMINB12, FOLATE, FERRITIN, TIBC, IRON, RETICCTPCT in the last 72 hours. Sepsis Labs: Recent Labs  Lab 07/06/18 0228 07/07/18 0519 07/08/18 0603 07/10/18 0534  WBC 7.7 8.3 12.1* 12.6*   Microbiology Recent Results (from the past 240 hour(s))  Culture, blood (single)     Status: None   Collection Time: 07/03/18  1:42 AM  Result Value Ref Range Status   Specimen Description   Final    BLOOD LEFT HAND Performed at [facility], [place] [address] [place], [place] [address]    Special Requests   Final    BOTTLES DRAWN AEROBIC ONLY Blood Culture adequate volume Performed at [facility] [address] [place], [place] [address]    Culture   Final    NO GROWTH 5 DAYS Performed at [facility], [place] [address], [place], [place] [address]    Report Status 07/08/2018 FINAL  Final  MRSA PCR Screening     Status: None   Collection Time: 07/03/18  7:33 PM  Result Value Ref Range Status   MRSA by PCR NEGATIVE NEGATIVE Final    Comment:        The GeneXpert MRSA Assay (FDA approved for NASAL specimens only), is one component of a comprehensive MRSA colonization surveillance program. It is not intended to diagnose MRSA infection nor to guide or monitor treatment for MRSA infections. Performed at [facility], [place] [address], [place], [place] [address]      Medications:   . sodium chloride   Intravenous Once  . chlorhexidine  15 mL Mouth Rinse BID  . Chlorhexidine Gluconate Cloth  6 each Topical Daily  . enoxaparin (LOVENOX) injection  40 mg Subcutaneous Q24H  . insulin aspart  0-15 Units Subcutaneous Q4H  . mouth rinse  15 mL Mouth Rinse q12n4p  . metoprolol  tartrate  10 mg Intravenous Q6H  . pantoprazole (PROTONIX) IV  40 mg Intravenous Daily  . sodium chloride flush  10-40 mL Intracatheter Q12H   Continuous Infusions: . sodium chloride 10 mL/hr at 07/08/18 1049  . methocarbamol (ROBAXIN) IV Stopped (07/10/18 9024)  . piperacillin-tazobactam (ZOSYN)  IV 12.5 mL/hr at 07/10/18 0438  .  TPN ADULT (ION) 75 mL/hr at 07/10/18 0438      LOS: 15 days   [name]  [facility]   *Please refer to [email or web address], password [email or web address] to get updated schedule on who will round on this patient, as hospitalists switch teams weekly. If 7PM-7AM, please contact night-coverage at [email or web address], password [email or web address] for any overnight needs.  07/10/2018, 7:04 AM

## 2018-07-10 NOTE — Care Management Note (Addendum)
Case Management Note  Patient Details  Name: MANVI GUILLIAMS MRN: [id number] Date of Birth: May 29, 1925  Subjective/Objective:                  Early breakdown of abd wound by notes no stool in bag but does have some brown drainage from vagina  Action/Plan: Will follow for progression of care and clinical status. Will follow for case management needs none present at this time.lives alone may need snf placement due to condition and prolonged hospital stay.  Expected Discharge Date:                  Expected Discharge Plan:  [place]  In-House Referral:     Discharge planning Services  CM Consult  Post Acute Care Choice:  Resumption of Svcs/PTA Provider Choice offered to:     DME Arranged:    DME Agency:     HH Arranged:  OT, PT [place] Agency:  [place]  Status of Service:  In process, will continue to follow  If discussed at Long Length of Stay Meetings, dates discussed:  [id number]  Additional Comments:  Leeroy Cha, RN 07/10/2018, 8:40 AM

## 2018-07-10 NOTE — Progress Notes (Addendum)
7 Days Post-Op    WR:[id number] pain/rectal bleeding  Subjective: Midline abdominal wound is breaking down more this morning.  She also has a liquid brownish stool appearing fluid coming from the vaginal area.  She has flatus in her ostomy bag but no stool.  Objective: Vital signs in last 24 hours: Temp:  [97.6 F (36.4 C)-98.5 F (36.9 C)] 98.5 F (36.9 C) (11/21 0341) Pulse Rate:  [38-107] 68 (11/21 0630) Resp:  [15-27] 27 (11/21 0630) BP: (109-155)/(54-68) 140/57 (11/21 0000) SpO2:  [98 %-100 %] 100 % (11/21 0630) Last BM Date: 07/06/18 2147 IV 1525 urine 275 drain Nothing listed for ostomy Afebrile vital signs are stable WBC 12.6, H/H7 0.9/24.7  Glucose 274 sodium 131 LFTs are stable  Intake/Output from previous day: 11/20 0701 - 11/21 0700 In: 2147.3 [I.V.:1567.5; IV Piggyback:579.8] Out: 1800 [Urine:1525; Drains:275] Intake/Output this shift: No intake/output data recorded.  General appearance: alert, cooperative and no distress Resp: clear to auscultation bilaterally and Anterior exam GI: Midline abdominal wound shows more breakdown today.  Most of the base of the midline is breaking down this a.m.  Ostomy is pale but functional.  She has some flatus in the ostomy bag but no stool.  Still sweat like fluid.  the Foley is still in, and she has a brown feculent appearing fluid draining from the vaginal area.. Drainage from the JP drain remains clear and serous. Lab Results:  Recent Labs    07/08/18 0603 07/10/18 0534  WBC 12.1* 12.6*  HGB 8.1* 7.9*  HCT 25.4* 24.7*  PLT 183 181    BMET Recent Labs    07/09/18 0539 07/10/18 0534  NA 130* 131*  K 4.0 4.3  CL 100 99  CO2 27 26  GLUCOSE 237* 274*  BUN 14 20  CREATININE 0.46 0.41*  CALCIUM 7.3* 7.3*   PT/INR No results for input(s): LABPROT, INR in the last 72 hours.  Recent Labs  Lab 07/06/18 0315 07/07/18 0519 07/08/18 0603 07/09/18 0539 07/10/18 0534  AST 13* 12* 12* 12* 10*  ALT 14 13 12 10  9   ALKPHOS 79 67 69 70 70  BILITOT 0.8 0.5 0.5 0.6 0.4  PROT 4.3* 4.0* 4.2* 4.0* 4.0*  ALBUMIN 1.7* 1.5* 1.5* 1.4* 1.3*     Lipase     Component Value Date/Time   LIPASE 16 06/13/2018 1754     Medications: . sodium chloride   Intravenous Once  . chlorhexidine  15 mL Mouth Rinse BID  . Chlorhexidine Gluconate Cloth  6 each Topical Daily  . enoxaparin (LOVENOX) injection  40 mg Subcutaneous Q24H  . insulin aspart  0-15 Units Subcutaneous Q4H  . insulin glargine  5 Units Subcutaneous QHS  . mouth rinse  15 mL Mouth Rinse q12n4p  . metoprolol tartrate  10 mg Intravenous Q6H  . pantoprazole (PROTONIX) IV  40 mg Intravenous Daily  . sodium chloride flush  10-40 mL Intracatheter Q12H   . sodium chloride 10 mL/hr at 07/08/18 1049  . methocarbamol (ROBAXIN) IV Stopped (07/10/18 1914)  . piperacillin-tazobactam (ZOSYN)  IV Stopped (07/10/18 0710)  . sodium chloride 0.9 % 1,000 mL with potassium chloride 10 mEq infusion    . TPN ADULT (ION) 75 mL/hr at 07/10/18 0438   Anti-infectives (From admission, onward)   Start     Dose/Rate Route Frequency Ordered Stop   07/03/18 2200  piperacillin-tazobactam (ZOSYN) IVPB 3.375 g     3.375 g 12.5 mL/hr over 240 Minutes Intravenous Every 8 hours 07/03/18 2034  07/03/18 1200  piperacillin-tazobactam (ZOSYN) IVPB 3.375 g  Status:  Discontinued     3.375 g 12.5 mL/hr over 240 Minutes Intravenous Every 8 hours 07/03/18 0444 07/03/18 2012   07/03/18 0445  piperacillin-tazobactam (ZOSYN) IVPB 3.375 g     3.375 g 100 mL/hr over 30 Minutes Intravenous  Once 07/03/18 0440 07/03/18 0640   06/25/18 1600  cefTRIAXone (ROCEPHIN) 2 g in sodium chloride 0.9 % 100 mL IVPB  Status:  Discontinued     2 g 200 mL/hr over 30 Minutes Intravenous Every 24 hours 06/25/18 1546 06/27/18 1348   06/25/18 1600  metroNIDAZOLE (FLAGYL) IVPB 500 mg  Status:  Discontinued     500 mg 100 mL/hr over 60 Minutes Intravenous Every 8 hours 06/25/18 1546 06/27/18 1348        Assessment/Plan  Acute hypoxic respiratory failure-extubated 11/15 HXsevere ulcerative colitis -Zosyn day 4 Hypertension History ofacute onchronic anemia - H/H 6.5/21 this AM History of chronic atrial fibrillation-on Xarelto at home DNR Severe Malnutrition - prealbumin 5.2 - TNA per Medicine   Perforated sigmoid colon with fecal contamination Left colectomy, and transverse colostomy, takedown splenic flexure, 07/03/2018 Dr. Rolm Bookbinder POD # 7 Post op ileus  FEN: TPN/n.p.o. ID: Rocephin/Flagyl 11/6 - 11/8;Zosyn 11/14 =>>day 6 DVT: Lovenox Follow up: Dr. Donne Hazel  Plan: Reviewed with Dr. Hassell Done will get a stat CT and follow.  Keep her n.p.o. for now.  CT scan: Interval resection of the distal colon and creation of rectal pouch. 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 2. No pelvic abscess or free intraperitoneal air. 3. Cholelithiasis. 4. Unremarkable appearance of the transverse colostomy. No evidence for bowel obstruction. 5. Increased bilateral effusions, bibasilar atelectasis, and body wall edema.   LOS: 15 days    Keayra Graham 07/10/2018 [phone]

## 2018-07-10 NOTE — Consult Note (Signed)
[place] Nurse ostomy follow up: POD #7 Stoma type/location: RUQ transverse colostomy Stomal assessment/size: 2 inch oval, medial edge is very close to midline wound. Stoma is edematous and pale, moist. Peristomal assessment: intact Treatment options for stomal/peristomal skin: skin barrier ring Output: small amount of serosanguinous effluent in pouch. Ostomy pouching: 2pc. 2 3/4" pouching system with skin barrier ring Education provided: No family in room to teach, pt unable to understand.  Enrolled patient in [facility] Start Discharge program: No WOC nursing team will follow, and will remain available to this patient, the nursing, surgical and medical teams.   Fara Olden, RN-C, WTA-C, [place] Wound Treatment Associate Ostomy Care Associate

## 2018-07-10 NOTE — Progress Notes (Signed)
PHARMACY - ADULT TOTAL PARENTERAL NUTRITION CONSULT NOTE   Pharmacy Consult for TPN Indication: s/p sigmoid colon perforation repair, postop ileus  Patient Measurements: Height: 5\' 1"  (154.9 cm) Weight: 154 lb 15.7 oz (70.3 kg) IBW/kg (Calculated) : 47.8   Body mass index is 29.28 kg/m.  Insulin Requirements: 13 units of moderate SSI  Current Nutrition: NPO, except sips of clears  IVF: NS at [facility] access: PICC TPN start date: 11/17  ASSESSMENT                                                                                                          HPI: Patient with PMH significant for ulcerative colitis, chronic afib on xarelto (not taking PTA), CKD, HTN, and breast cancer. Recently discharged from [facility] on 06/20/18 for UC flare and prescribed steroids. Presents this admission on 07/02/18 with abdominal cramping and constipation. CT abdomen showed large amount of free air.  Significant events:  11/14: colostomy and colectomy 11/17: start TPN 11/18 NG tube removed by patient.  No nausea, but essentially no ostomy output, no flatus. 11/20 Patient is SD-ICU status, start lipids in TPN today. 11/21 Stat CT d/t stool appearing fluid leaking from vaginal area.  Today:    Glucose:  No hx DM, A1c 6.6 05/2018, however, steroid-induced hyperglycemia.  CBGs increasing on TPN (range 112-194) with glucose 274 on CMET.  Monitor closely, consider adding insulin to TPN if consistently using > 15 units /day.  Electrolytes: Na low (131), others WNL including corrected Ca   Renal: SCr wnl/stable, UOP 1578mL /24hr, I/O (+) 347 /24 hrs (+13.2 L this admission)  LFTs: WNL, albumin low (1.3)  TGs: 79 (11/18)  Prealbumin: 5.2 (11/18)  NUTRITIONAL GOALS                                                                                             RD recs: (11/18): 1650-1850 kcal, 80-90g protein per day  Custom TPN at goal rate of 75 ml/hr provides: 90 g/day protein (50 g/L) 45 g/day  Lipid  (25 g/L) 306 g/day Dextrose (17 %) 1850 Kcal/day   PLAN  At 1800 today:  ContinueTPN at goal rate of 75 ml/hr. -  Provides 90 g of protein, 306 g of dextrose, 45g Lipids which provides 1850 kCals per day, meeting 100 % of goal kcal and AA  Electrolytes in TPN: increase to Na 55.  Continue increased K 55 meq/L and Phos 20 mmol/L, Cl:Ac ratio 1:1  TPN to contain standard multivitamins and trace elements.  IVF at [facility].  Continue moderate SSI q4h.   TPN lab panels on Mondays & Thursdays. BMET tomorrow AM.  F/u daily.   Gretta Arab PharmD, BCPS Pager [phone] 07/10/2018 7:20 AM

## 2018-07-10 NOTE — Progress Notes (Signed)
PT Cancellation Note  Patient Details Name: Felicia Carlson MRN: [id number] DOB: 05/29/1925   Cancelled Treatment:    Reason Eval/Treat Not Completed: Patient at procedure or test/unavailable(pt out of room. Will follow. )   Philomena Doheny PT 07/10/2018  Acute Rehabilitation Services Pager [phone] Office [phone]

## 2018-07-10 NOTE — Progress Notes (Signed)
On shift assessment, it was noted that patient had liquid brown stool leaking from her vaginal area. PA  Will Creig Hines aware. Orders to send pt for CT scan.  Wet to dry wound dressing done.

## 2018-07-10 NOTE — Progress Notes (Signed)
Peripherally Inserted Central Catheter/Midline Placement  The IV Nurse has discussed with the patient and/or persons authorized to consent for the patient, the purpose of this procedure and the potential benefits and risks involved with this procedure.  The benefits include less needle sticks, lab draws from the catheter, and the patient may be discharged home with the catheter. Risks include, but not limited to, infection, bleeding, blood clot (thrombus formation), and puncture of an artery; nerve damage and irregular heartbeat and possibility to perform a PICC exchange if needed/ordered by physician.  Alternatives to this procedure were also discussed.  Bard Power PICC patient education guide, fact sheet on infection prevention and patient information card has been provided to patient /or left at bedside.    PICC/Midline Placement Documentation  PICC Triple Lumen 07/10/18 PICC Left 40 cm 0 cm (Active)  Indication for Insertion or Continuance of Line Administration of hyperosmolar/irritating solutions (i.e. TPN, Vancomycin, etc.) 07/10/2018  4:53 PM  Exposed Catheter (cm) 0 cm 07/10/2018  4:53 PM  Site Assessment Clean;Dry;Intact 07/10/2018  4:53 PM  Lumen #1 Status Flushed;Blood return noted 07/10/2018  4:53 PM  Lumen #2 Status Flushed;Blood return noted 07/10/2018  4:53 PM  Lumen #3 Status Flushed;Blood return noted 07/10/2018  4:53 PM  Dressing Type Transparent 07/10/2018  4:53 PM  Dressing Status Clean;Dry;Antimicrobial disc in place;Intact 07/10/2018  4:53 PM  Dressing Intervention New dressing 07/10/2018  4:53 PM  Dressing Change Due 07/17/18 07/10/2018  4:53 PM    PICC exchange from SL to TL   Felicia Carlson 07/10/2018, 4:55 PM

## 2018-07-10 NOTE — Consult Note (Signed)
[place] Nurse ostomy follow up Stoma type/location: RUQ transverse colostomy. Pouch applied Tuesday, 11/19 is intact. Stomal assessment/size: Viewed through pouch, 2-inch oval. Peristomal assessment: Not seen today Treatment options for stomal/peristomal skin: Skin barrier ring Output: No stool from ostomy. Some flatus and sweat only in pouch.  Reevaluate tomorrow. Ostomy pouching: 2pc. 2 and 3/4 inch pouching system with skin barrier ring applied on Tuesday is intact. Education provided: None.  Paitent worked up today to determine cause of feculent material from vagina. No family in room. Enrolled patient in [facility] Start Discharge program: No   WOC nursing team will follow, and will remain available to this patient, the nursing and medical teams.   Thanks, Maudie Flakes, MSN, RN, [place], Arther Abbott  Pager# [phone]

## 2018-07-11 LAB — GLUCOSE, CAPILLARY
GLUCOSE-CAPILLARY: 108 mg/dL — AB (ref 70–99)
GLUCOSE-CAPILLARY: 145 mg/dL — AB (ref 70–99)
GLUCOSE-CAPILLARY: 143 mg/dL — AB (ref 70–99)
GLUCOSE-CAPILLARY: 138 mg/dL — AB (ref 70–99)
GLUCOSE-CAPILLARY: 96 mg/dL (ref 70–99)

## 2018-07-11 LAB — BASIC METABOLIC PANEL
ANION GAP: 5 (ref 5–15)
BUN: 20 mg/dL (ref 8–23)
CO2: 25 mmol/L (ref 22–32)
Calcium: 7.5 mg/dL — ABNORMAL LOW (ref 8.9–10.3)
Chloride: 104 mmol/L (ref 98–111)
Creatinine, Ser: 0.54 mg/dL (ref 0.44–1.00)
Glucose, Bld: 190 mg/dL — ABNORMAL HIGH (ref 70–99)
POTASSIUM: 4.4 mmol/L (ref 3.5–5.1)
SODIUM: 134 mmol/L — AB (ref 135–145)

## 2018-07-11 LAB — CBC
HCT: 23.5 % — ABNORMAL LOW (ref 36.0–46.0)
Hemoglobin: 7.6 g/dL — ABNORMAL LOW (ref 12.0–15.0)
MCH: 30.8 pg (ref 26.0–34.0)
MCHC: 32.3 g/dL (ref 30.0–36.0)
MCV: 95.1 fL (ref 80.0–100.0)
NRBC: 0 % (ref 0.0–0.2)
PLATELETS: 182 10*3/uL (ref 150–400)
RBC: 2.47 MIL/uL — AB (ref 3.87–5.11)
RDW: 16.4 % — AB (ref 11.5–15.5)
WBC: 13.1 10*3/uL — AB (ref 4.0–10.5)

## 2018-07-11 MED ORDER — FENTANYL 12 MCG/HR TD PT72
12.5000 ug | MEDICATED_PATCH | TRANSDERMAL | Status: DC
  Administered 2018-07-11 – 2018-07-12 (×2): 12.5 ug via TRANSDERMAL
  Filled 2018-07-11 (×2): qty 1

## 2018-07-11 MED ORDER — BOOST / RESOURCE BREEZE PO LIQD CUSTOM
1.0000 | Freq: Two times a day (BID) | ORAL | Status: DC
  Administered 2018-07-12 – 2018-07-13 (×4): 1 via ORAL

## 2018-07-11 MED ORDER — ACETAMINOPHEN 10 MG/ML IV SOLN
1000.0000 mg | Freq: Four times a day (QID) | INTRAVENOUS | Status: DC
  Administered 2018-07-11 (×2): 1000 mg via INTRAVENOUS
  Filled 2018-07-11 (×3): qty 100

## 2018-07-11 MED ORDER — UNJURY CHICKEN SOUP POWDER
8.0000 [oz_av] | Freq: Every day | ORAL | Status: DC
  Administered 2018-07-12: 8 [oz_av] via ORAL
  Filled 2018-07-11 (×4): qty 27

## 2018-07-11 MED ORDER — SODIUM CHLORIDE 0.9 % IV SOLN
INTRAVENOUS | Status: DC
  Administered 2018-07-11 – 2018-07-14 (×3): via INTRAVENOUS

## 2018-07-11 MED ORDER — ACETAMINOPHEN 10 MG/ML IV SOLN
1000.0000 mg | Freq: Four times a day (QID) | INTRAVENOUS | Status: AC
  Administered 2018-07-11 – 2018-07-12 (×4): 1000 mg via INTRAVENOUS
  Filled 2018-07-11 (×4): qty 100

## 2018-07-11 MED ORDER — ACETAMINOPHEN 10 MG/ML IV SOLN
1000.0000 mg | Freq: Four times a day (QID) | INTRAVENOUS | Status: AC
  Administered 2018-07-13 (×4): 1000 mg via INTRAVENOUS
  Filled 2018-07-11 (×6): qty 100

## 2018-07-11 MED ORDER — MORPHINE SULFATE (PF) 4 MG/ML IV SOLN
4.0000 mg | Freq: Once | INTRAVENOUS | Status: AC
  Administered 2018-07-11: 4 mg via INTRAVENOUS
  Filled 2018-07-11: qty 1

## 2018-07-11 MED ORDER — MORPHINE SULFATE (PF) 4 MG/ML IV SOLN
4.0000 mg | INTRAVENOUS | Status: DC | PRN
  Administered 2018-07-11 – 2018-07-14 (×14): 4 mg via INTRAVENOUS
  Filled 2018-07-11 (×13): qty 1

## 2018-07-11 MED ORDER — TRAVASOL 10 % IV SOLN
INTRAVENOUS | Status: AC
  Administered 2018-07-11: 17:00:00 via INTRAVENOUS
  Filled 2018-07-11: qty 900

## 2018-07-11 NOTE — Progress Notes (Signed)
Nutrition Follow-up  DOCUMENTATION CODES:   Not applicable  INTERVENTION:  - Will order Boost Breeze BID, each supplement provides 250 kcal and 9 grams of protein. - Will order Unjury Chicken Soup once/day, this 8 oz serving provides 100 kcal and 21 grams of protein.  - Continue TPN per Pharmacy. - Continue to advance diet as medically feasible. - Encourage PO intakes.  - Weigh patient today.    NUTRITION DIAGNOSIS:   Inadequate oral intake related to decreased appetite as evidenced by per patient/family report. -ongoing  GOAL:   Patient will meet greater than or equal to 90% of their needs -met with TPN regimen  MONITOR:   PO intake, Supplement acceptance, Diet advancement, Weight trends, Labs, I & O's, Other (Comment)(TPN regimen)  ASSESSMENT:   Patient with PMH significant for ulcerative colitis, CKD, HTN, and breast cancer s/p lumpectomy. Recently discharged from [facility] on 11/1 for UC flare and prescribed steroids. Presents this admission with abdominal cramping and constipation.   No weight since 11/15; per review of orders, patient to be weighed every Monday (new weight was to be obtained 11/18). Patient resting at the time of RD visit. Daughter at bedside and reports that patient was having abdominal pain yesterday but has not indicated any pain today and no nausea recently. Diet was advanced from NPO to CLD today at 77 AM but patient had not tried anything PO at the time of RD visit earlier this AM.  Patient receiving TPN (10% amino acids, 17% dextrose, 30% ILE) @ 75 mL/hr which is providing 1850 kcal, 90 grams of protein. TPN running through triple lumen PICC.    Medications reviewed; sliding scale Novolog, 5 units Lantus/day. Labs reviewed; CBGs: 108 and 145 mg/dL, Na: 134 mmol/L, Ca: 7.5 mg/dL. IVF; NS @ 50 mL/hr.      Diet Order:   Diet Order            Diet clear liquid Room service appropriate? No; Fluid consistency: Thin; Fluid restriction: Other (see  comments)  Diet effective now              EDUCATION NEEDS:   Not appropriate for education at this time  Skin:  Skin Assessment: Skin Integrity Issues: Skin Integrity Issues:: Incisions Incisions: abdominal (11/14)  Last BM:  11/17  Height:   Ht Readings from Last 1 Encounters:  07/03/18 _0  (1.549 m)    Weight:   Wt Readings from Last 1 Encounters:  07/04/18 70.3 kg    Ideal Body Weight:  47.7 kg  BMI:  Body mass index is 29.28 kg/m.  Estimated Nutritional Needs:   Kcal:  1650-1850  Protein:  80-90 grams  Fluid:  >/= 1.7 L/day      Jarome Matin, MS, RD, LDN, [facility] Inpatient Clinical Dietitian Pager # [phone] After hours/weekend pager # [phone]

## 2018-07-11 NOTE — Progress Notes (Signed)
PT Cancellation Note  Patient Details Name: TALITHA DICARLO MRN: [id number] DOB: 09/22/1924   Cancelled Treatment:     per chart review will await Palliative Consult.  Pt has been evaluated with rec for SNF   Rica Koyanagi  PTA Acute  Rehabilitation Services Pager      [phone] Office      [phone]

## 2018-07-11 NOTE — Progress Notes (Signed)
Palliative Medicine consult noted. Due to high referral volume, there may be a delay seeing this patient. Please call the Palliative Medicine Team office at [phone] if recommendations are needed in the interim.  Thank you for inviting [place] to see this patient.  Marjie Skiff Hillman Attig, RN, BSN, [facility] Palliative Medicine Team 07/11/2018 1:43 PM Office [phone]

## 2018-07-11 NOTE — Consult Note (Signed)
Palliative Care Consult Note Reason: Goals of Care  84 yo woman with long standing ulcerative colitis, afib on xaralto, frequent steroids for UC flares admitted with perforation and taken for urgent surgery on 11/14 now in ICU post-op with colectomy and colostomy having issues with pain and drainage associated with a rectovaginal fistula and respiratory failure briefly intubated and now resolved Significant pain issues but under better control with IV tylenol-she is taking clears but no meds PO. Her mental status has better than expected given what she has been through and she is alert and oriented. I have discussed her case in detail with Dr. Hassell Done and Dr. Olevia Bowens spoke with family today to update them.   She is very high risk for decline and further complications- we will probably know over the next few days which direction she will go in terms of stabilizing or declining. Given her advanced age and co-morbidities she will likely not recover to her fully independent baseline and will require some type of assistance either in home ATC care or SNF. Her best chance at making a meaningful recovery will be in managing her pain, encouraging mobility and maximizing her nutrition- also allowing for uninterrupted sleep at night which is sometimes difficult in the ICU.  She is a DNR. Goals are to treat reversible conditions and give her the best possible chance to recover with maximum possible independence.  Recommendations:  1. Will plan to have a family meeting on Monday-will see how she does over the weekend- we can see her sooner if needed or her condition deteriorates.  2. She needs continued scheduled IV Tylenol, she cannot take pills and cannot use rectal route-will make sure pharmacy is notified of this need- she has had exceptional pain control from the IV tylenol and this should be continued for at least the next few days.   She had a 12.3mcg Fentanyl patch placed at around 10AM today by [facility] and  prior to this she required about 24mg  of IV morphine in the preceding 24 hours -will take 12-24 for fentanyl to start working but tonight she appears to be much more comfortable. Will see how much PRN morphine she needs in next 24 hours and adjust dose if needed.  3. Encourage OOB and mobility  4. Allow for rest at night, minimize interruptions.  Time: 50 minutes Greater than 50%  of this time was spent counseling and coordinating care related to the above assessment and plan.  Lane Hacker, DO Palliative Medicine

## 2018-07-11 NOTE — Progress Notes (Addendum)
8 Days Post-Op    CC:  Abdominal pain and rectal bleeding  Subjective: She is having a lot of abdominal pain. Drainage from the JP is clear, the Midline incision is stable, no further deterioration of the site.  She has fair amount of stool in her ostomy bag this morning.  She has had some vaginal drainage, but much less than yesterday.  She has a lot of serous drainage around the JP site.  Objective: Vital signs in last 24 hours: Temp:  [98.1 F (36.7 C)-99.2 F (37.3 C)] 99 F (37.2 C) (11/22 0343) Pulse Rate:  [56-88] 69 (11/22 0000) Resp:  [15-24] 22 (11/22 0618) BP: (107-186)/(39-115) 135/89 (11/22 0618) SpO2:  [90 %-100 %] 96 % (11/22 0000) Last BM Date: 07/06/18 2753 IV 1480 IV Drain 250 Stool 475 Afebrile, TM 99.3 Labs pending CT 07/10/18:  Interval resection of the distal colon and creation of rectal pouch. 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 No pelvic abscess or free intraperitoneal air. Cholelithiasis.  Unremarkable appearance of the transverse colostomy. No evidence for bowel obstruction.   Increased bilateral effusions, bibasilar atelectasis, and body wall edema.  Pain control: IV tylenol 1 gm x 3, Robaxin x 3, Morphine  4mg  x 6 yesterday and 3 times this AM  Intake/Output from previous day: 11/21 0701 - 11/22 0700 In: 2753.9 [I.V.:2296.1; IV Piggyback:457.8] Out: 2210 [Urine:1480; Drains:255; Stool:475] Intake/Output this shift: No intake/output data recorded.  General appearance: alert, cooperative and She just had 8 milligrams of IV morphine by Dr. Olevia Bowens, she seems more comfortable now Resp: clear to auscultation bilaterally and anterior GI: Soft, she has good stool output from her colostomy this morning.  Midline incision is stable.  Serous drainage in the JP and around the JP.  Some ongoing vaginal drainage but much less than what we noted yesterday.  Lab Results:  Recent Labs    07/10/18 0534  WBC 12.6*  HGB 7.9*   HCT 24.7*  PLT 181    BMET Recent Labs    07/09/18 0539 07/10/18 0534  NA 130* 131*  K 4.0 4.3  CL 100 99  CO2 27 26  GLUCOSE 237* 274*  BUN 14 20  CREATININE 0.46 0.41*  CALCIUM 7.3* 7.3*   PT/INR No results for input(s): LABPROT, INR in the last 72 hours.  Recent Labs  Lab 07/06/18 0315 07/07/18 0519 07/08/18 0603 07/09/18 0539 07/10/18 0534  AST 13* 12* 12* 12* 10*  ALT 14 13 12 10 9   ALKPHOS 79 67 69 70 70  BILITOT 0.8 0.5 0.5 0.6 0.4  PROT 4.3* 4.0* 4.2* 4.0* 4.0*  ALBUMIN 1.7* 1.5* 1.5* 1.4* 1.3*     Lipase     Component Value Date/Time   LIPASE 16 06/13/2018 1754     Medications: . sodium chloride   Intravenous Once  . chlorhexidine  15 mL Mouth Rinse BID  . Chlorhexidine Gluconate Cloth  6 each Topical Daily  . enoxaparin (LOVENOX) injection  40 mg Subcutaneous Q24H  . insulin aspart  0-15 Units Subcutaneous Q4H  . insulin glargine  5 Units Subcutaneous QHS  . mouth rinse  15 mL Mouth Rinse q12n4p  . metoprolol tartrate  10 mg Intravenous Q6H  . pantoprazole (PROTONIX) IV  40 mg Intravenous Daily  . sodium chloride flush  10-40 mL Intracatheter Q12H    Assessment/Plan Acute hypoxic respiratory failure-extubated 11/15 HXsevereulcerativecolitis -Zosyn day 4 Hypertension History ofacute onchronic anemia - H/H 6.5/21 this AM History of  chronic atrial fibrillation-on Xarelto at home DNR Severe Malnutrition - prealbumin 5.2 - TNA per Medicine   Perforated sigmoid colon with fecal contamination Left colectomy, and transverse colostomy, takedown splenic flexure, 07/03/2018 Dr. Rolm Bookbinder POD # 8  - Post op ileus  - CT for vaginal drainage 11/21:Possible rectovaginal fistula between the LEFT vaginal cuff     and the rectum.   FEN: TPN/n.p.o. ID: Rocephin/Flagyl 11/6 - 11/8;Zosyn 11/14 =>>day 6 DVT: Lovenox Follow up: Dr. Donne Hazel  Plan: She has a new fistula and is fairly uncomfortable from this.  Her colectomy and  colostomy seem to be doing well.  Discussed briefly with Dr.Ortiz, he would like to get palliative involved. I am getting give her some clears and see if we can get her on some oral nutrition along with a TNA.  Will review with Dr. Hassell Done.  I have added IV Tylenol.  Labs are pending for this a.m.She is having allot of pain will add low dose fentanyl patch to help with this.       LOS: 16 days    Felicia Carlson 07/11/2018 [phone]

## 2018-07-11 NOTE — Progress Notes (Signed)
[facility] PROGRESS NOTE    Progress Note  ANALYS RYDEN  TKZ:[id number] DOB: March 10, 1925 DOA: 06/25/2018 PCP: Lajean Manes, MD     Brief Narrative:   Felicia Carlson is an 84 y.o. female past medical history significant of ulcerative colitis, chronic atrial fibrillation on Xarelto, recently discharged on 08/20/2017 for ulcerative colitis flares placed on steroids a flex sig during that time showed left-sided ulcerative colitis presented on 06/25/2018 for bright red blood per rectum and abdominal pain for a week, on 07/02/2018 patient had worsening abdominal pain a CT scan of the abdomen and pelvis showed free air, initially it showed no extravasation of oral contrast and the patient was nontoxic-appearing per chart, during this time the patient was felt not to require emergent surgery, initially also the patient was hesitant about proceeding with surgery however she deteriorated on 07/03/2018 at which time surgery recommended emergent exploratory laparotomy and when colectomy with colostomy on 07/03/2018  Assessment/Plan:   Pneumoperitoneum perforation of sigmoid: Status post left colectomy, colostomy on 07/03/2018: Likely due to ulcerative colitis. Currently on TPN, IV Zosyn for presumed intra-abdominal infection, antibiotic management per surgery. She relates her pain is controlled. CT scan of the abdomen and pelvis show possible rectovaginal fistula, no pelvic abscess or intraperitoneal air, she does have choledocholithiasis  Acute hypoxic respiratory failure with hypoxia: Intubated on 07/03/2018, status post extubation on 07/04/2018. Currently on nasal oxygen saturating above 90%. Continue incentive spirometry.  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 Continue to hold steroids per surgery recommendations. Further management per surgery.  Irregular rhythm: Rate controlled sinus  rhythm with multiple PACs.  Essential hypertension: Blood pressure is high. We will continue IV metoprolol scheduled.  Chronic atrial fibrillation: Chads VASC 5, holding Xarelto. Was discussed with family and will discontinue Xarelto, they understand the potential risk and benefits. We will continue Lovenox subcutaneously.  Acute blood loss anemia/ Acute on chronic anemia Globin has been stable ranging from 8.5-8.1. We will continue to monitor closely.  Hyperglycemia: Likely due to steroids, she is n.p.o. and TPN.  Hematuria: Possibly due to rectovaginal fistula. She is currently on Zosyn.  Third spacing: We will have to be judicious with IV fluids, she has bilateral pleural effusion and body wall edema  RN Pressure Injury Documentation:    Estimated body mass index is 29.28 kg/m as calculated from the following:   Height as of this encounter: 5\' 1"  (1.549 m).   Weight as of this encounter: 70.3 kg. Malnutrition Type:  Nutrition Problem: Inadequate oral intake Etiology: decreased appetite   Malnutrition Characteristics:  Signs/Symptoms: per patient/family report   Nutrition Interventions:  Interventions: Refer to RD note for recommendations    DVT prophylaxis: lovenox Family Communication:none Disposition Plan/Barrier to D/C: unable to determine Code Status:     Code Status Orders  (From admission, onward)         Start     Ordered   07/05/18 1038  Do not attempt resuscitation (DNR)  Continuous    Question Answer Comment  In the event of cardiac or respiratory ARREST Do not call a "code blue"   In the event of cardiac or respiratory ARREST Do not perform Intubation, CPR, defibrillation or ACLS   In the event of cardiac or respiratory ARREST Use medication by any route, position, wound care, and other measures to relive pain and suffering. May use oxygen, suction and manual treatment of airway obstruction as needed  for comfort.      07/05/18 1038          Code Status History    Date Active Date Inactive Code Status Order ID Comments User Context   07/03/2018 2012 07/05/2018 1038 Full Code [id number]  Rolm Bookbinder, MD Inpatient   06/25/2018 1825 07/03/2018 2012 DNR [id number]  [name], MD Inpatient   06/25/2018 1825 06/25/2018 1825 Full Code [id number]  [name], MD Inpatient   06/13/2018 2323 06/20/2018 1619 DNR [id number]  [name], MD Inpatient   10/26/2014 1912 10/30/2014 1814 Full Code [id number]  [name], MD Inpatient    Advance Directive Documentation     Most Recent Value  Type of Advance Directive  Healthcare Power of Attorney  Pre-existing out of facility DNR order (yellow form or pink MOST form)  -  "MOST" Form in Place?  -        IV Access:    Peripheral IV   Procedures and diagnostic studies:   Ct Abdomen Pelvis Wo Contrast  Result Date: 07/10/2018 CLINICAL DATA:  Perforated sigmoid colon with fecal contamination. Status post LEFT colectomy in transverse colostomy, take down splenic flexure on 07/03/2018. "Midline abdominal wound is breaking down more this morning. She also has a liquid brownish stool appearing fluid coming from the vaginal area. She has flatus in her ostomy bag but no stool." EXAM: CT ABDOMEN AND PELVIS WITHOUT CONTRAST TECHNIQUE: Multidetector CT imaging of the abdomen and pelvis was performed following the standard protocol without IV contrast. COMPARISON:  None. FINDINGS: Lower chest: There are increased bilateral pleural effusions and bibasilar atelectasis. Central line is partially imaged in the LOWER superior vena cava. There is dense atherosclerotic calcification of the coronary arteries. 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 There is atherosclerotic calcification of the thoracic aorta. Hepatobiliary: Layering debris/stones within the gallbladder. Gallbladder is distended. Pancreas: Unremarkable. No pancreatic ductal dilatation or  surrounding inflammatory changes. Spleen: Normal in size without focal abnormality. Adrenals/Urinary Tract: Punctate calcification identified in the midpole region of the RIGHT kidney. There is no hydronephrosis. The ureters are unremarkable. Urinary bladder is decompressed by a Foley catheter. No inflammatory changes surrounding the urinary bladder. Stomach/Bowel: Stomach and small bowel loops are normal in appearance. Status post transverse colostomy and resection of the distal colon. The rectal couch has a small amount of fluid within it. Small amount of stranding is identified between the rectal pouch in the LEFT vaginal cuff. See below. Surgical drain is identified within the pelvis. No loculated fluid collections are present. Vascular/Lymphatic: There is dense atherosclerotic calcification of the abdominal aorta. Reproductive: Hysterectomy. No adnexal mass. There is a possible communication of the LEFT vaginal cuff with the rectal couch, best seen on axial image 76/2 and sagittal image 55/5. No air identified within the vaginal cough. Other: Diffuse body wall edema. Large anterior abdominal wall surgical wound, not associated with focal fluid collection or obvious enterocutaneous fistula. No abscess. No free intraperitoneal air. Musculoskeletal: Degenerative changes throughout the thoracolumbar spine. IMPRESSION: 1. Interval resection of the distal colon and creation of rectal pouch. 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 2. No pelvic abscess or free intraperitoneal air. 3. Cholelithiasis. 4. Unremarkable appearance of the transverse colostomy. No evidence for bowel obstruction. 5. Increased bilateral effusions, bibasilar atelectasis, and body wall edema. 6. Aneurysmal aortic root. Recommend annual imaging followup by CTA or MRA. This recommendation follows 2010 ACCF/AHA/AATS/ACR/ASA/SCA/SCAI/SIR/STS/SVM Guidelines for the Diagnosis and Management of Patients with Thoracic  Aortic Disease. Circulation. 2010; 121: [id number] 7. Hysterectomy. Electronically Signed   By: Nolon Nations M.D.   On: 07/10/2018 15:10   [place] Ekg Site Rite  Result Date: 07/10/2018 If Site Rite image not attached, placement could not be confirmed due to current cardiac rhythm.    Medical Consultants:    None.  Anti-Infectives:   Zosyn  Subjective:    LAJEAN BOESE relates her pain is not controlled.   Objective:    Vitals:   07/11/18 0400 07/11/18 0500 07/11/18 0600 07/11/18 0618  BP: (!) 170/58 (!) 162/115 (!) 186/100 135/89  Pulse:      Resp: 20 (!) 21 20 (!) 22  Temp:      TempSrc:      SpO2:      Weight:      Height:        Intake/Output Summary (Last 24 hours) at 07/11/2018 0716 Last data filed at 07/11/2018 3235 Gross per 24 hour  Intake 2753.89 ml  Output 2210 ml  Net 543.89 ml   Filed Weights   07/03/18 1900 07/04/18 0400  Weight: 70 kg 70.3 kg    Exam: General exam: In no acute distress. Respiratory system: Good air movement and clear to auscultation. Cardiovascular system: S1 & S2 heard, RRR. Gastrointestinal system: Abdomen is nondistended, soft ostomy in place. Central nervous system: Alert and oriented. No focal neurological deficits. Extremities: No pedal edema. Skin: No rashes, lesions or ulcers Psychiatry: Judgement and insight appear normal. Mood & affect appropriate.    Data Reviewed:    Labs: Basic Metabolic Panel: Recent Labs  Lab 07/06/18 0315 07/07/18 0519 07/08/18 0603 07/09/18 0539 07/10/18 0534  NA 137 138 135 130* 131*  K 3.6 3.4* 4.0 4.0 4.3  CL 104 107 103 100 99  CO2 28 26 26 27 26   GLUCOSE 82 173* 171* 237* 274*  BUN 11 10 11 14 20   CREATININE 0.52 0.49 0.44 0.46 0.41*  CALCIUM 7.6* 7.4* 7.4* 7.3* 7.3*  MG 2.0 1.9 1.9 2.0 2.1  PHOS 2.4* 1.6* 2.1* 2.7 3.1   GFR Estimated Creatinine Clearance: 41.9 mL/min (A) (by C-G formula based on SCr of 0.41 mg/dL (L)). Liver Function Tests: Recent Labs  Lab  07/06/18 0315 07/07/18 0519 07/08/18 0603 07/09/18 0539 07/10/18 0534  AST 13* 12* 12* 12* 10*  ALT 14 13 12 10 9   ALKPHOS 79 67 69 70 70  BILITOT 0.8 0.5 0.5 0.6 0.4  PROT 4.3* 4.0* 4.2* 4.0* 4.0*  ALBUMIN 1.7* 1.5* 1.5* 1.4* 1.3*   No results for input(s): LIPASE, AMYLASE in the last 168 hours. No results for input(s): AMMONIA in the last 168 hours. Coagulation profile No results for input(s): INR, PROTIME in the last 168 hours.  CBC: Recent Labs  Lab 07/05/18 0420 07/06/18 0228 07/07/18 0519 07/07/18 2000 07/08/18 0603 07/10/18 0534  WBC 9.9 7.7 8.3  --  12.1* 12.6*  NEUTROABS  --   --  6.7  --   --   --   HGB 7.4* 7.2* 6.5* 8.3* 8.1* 7.9*  HCT 23.3* 23.2* 21.2* 25.4* 25.4* 24.7*  MCV 95.9 95.5 96.4  --  91.7 93.6  PLT 196 191 190  --  183 181   Cardiac Enzymes: No results for input(s): CKTOTAL, CKMB, CKMBINDEX, TROPONINI in the last 168 hours. BNP (last 3 results) No results for input(s): PROBNP in the last 8760 hours. CBG: Recent Labs  Lab 07/10/18 1158 07/10/18 1525 07/10/18 2019 07/10/18 2303 07/11/18 0308  GLUCAP 142* 92 152* 124* 108*   D-Dimer: No results for input(s): DDIMER in the last 72 hours. Hgb A1c: No results for input(s): HGBA1C in the last 72 hours. Lipid Profile: No results for input(s): CHOL, HDL, LDLCALC, TRIG, CHOLHDL, LDLDIRECT in the last 72 hours. Thyroid function studies: No results for input(s): TSH, T4TOTAL, T3FREE, THYROIDAB in the last 72 hours.  Invalid input(s): FREET3 Anemia work up: No results for input(s): VITAMINB12, FOLATE, FERRITIN, TIBC, IRON, RETICCTPCT in the last 72 hours. Sepsis Labs: Recent Labs  Lab 07/06/18 0228 07/07/18 0519 07/08/18 0603 07/10/18 0534  WBC 7.7 8.3 12.1* 12.6*   Microbiology Recent Results (from the past 240 hour(s))  Culture, blood (single)     Status: None   Collection Time: 07/03/18  1:42 AM  Result Value Ref Range Status   Specimen Description   Final    BLOOD LEFT  HAND Performed at [facility], [place] [address], [place], [place] [address]    Special Requests   Final    BOTTLES DRAWN AEROBIC ONLY Blood Culture adequate volume Performed at [place] [address], [place], [place] [address]    Culture   Final    NO GROWTH 5 DAYS Performed at [facility], [place] [address], [place], [place] [address]    Report Status 07/08/2018 FINAL  Final  MRSA PCR Screening     Status: None   Collection Time: 07/03/18  7:33 PM  Result Value Ref Range Status   MRSA by PCR NEGATIVE NEGATIVE Final    Comment:        The GeneXpert MRSA Assay (FDA approved for NASAL specimens only), is one component of a comprehensive MRSA colonization surveillance program. It is not intended to diagnose MRSA infection nor to guide or monitor treatment for MRSA infections. Performed at [facility], [place] [address] [place], [place] [address]      Medications:   . sodium chloride   Intravenous Once  . chlorhexidine  15 mL Mouth Rinse BID  . Chlorhexidine Gluconate Cloth  6 each Topical Daily  . enoxaparin (LOVENOX) injection  40 mg Subcutaneous Q24H  . insulin aspart  0-15 Units Subcutaneous Q4H  . insulin glargine  5 Units Subcutaneous QHS  . mouth rinse  15 mL Mouth Rinse q12n4p  . metoprolol tartrate  10 mg Intravenous Q6H  . pantoprazole (PROTONIX) IV  40 mg Intravenous Daily  . sodium chloride flush  10-40 mL Intracatheter Q12H   Continuous Infusions: . sodium chloride Stopped (07/11/18 0142)  . famotidine (PEPCID) IV Stopped (07/10/18 1818)  . methocarbamol (ROBAXIN) IV Stopped (07/11/18 0140)  . piperacillin-tazobactam (ZOSYN)  IV Stopped (07/11/18 0542)  . sodium chloride 0.9 % 1,000 mL with potassium chloride 10 mEq infusion 50 mL/hr at 07/11/18 0300  . TPN ADULT (ION)        LOS: 16 days   [name]  [facility]   *Please refer to [email or web address], password [email or web address] to get  updated schedule on who will round on this patient, as hospitalists switch teams weekly. If 7PM-7AM, please contact night-coverage at [email or web address], password [email or web address] for any overnight needs.  07/11/2018, 7:16 AM

## 2018-07-11 NOTE — Progress Notes (Signed)
PHARMACY - ADULT TOTAL PARENTERAL NUTRITION CONSULT NOTE   Pharmacy Consult for TPN Indication: s/p sigmoid colon perforation repair, postop ileus  Patient Measurements: Height: 5\' 1"  (154.9 cm) Weight: 154 lb 15.7 oz (70.3 kg) IBW/kg (Calculated) : 47.8   Body mass index is 29.28 kg/m.  Insulin Requirements: 7 units of moderate SSI Lantus 5 units daily added on 11/21 by TRH  Current Nutrition: clear liquids (11/22)  IVF: NS +KCl 10 meq/L at 50 ml/hr  Central access: PICC TPN start date: 11/17  ASSESSMENT                                                                                                          HPI: Patient with PMH significant for ulcerative colitis, chronic afib on xarelto (not taking PTA), CKD, HTN, and breast cancer. Recently discharged from [facility] on 06/20/18 for UC flare and prescribed steroids. Presents this admission on 07/02/18 with abdominal cramping and constipation. CT abdomen showed large amount of free air.  Significant events:  11/14: colostomy and colectomy 11/17: start TPN 11/18 NG tube removed by patient.  No nausea, but essentially no ostomy output, no flatus. 11/20 Patient is SD-ICU status, start lipids in TPN today. 11/21 RN reports stool appearing fluid leaking from vaginal area.  CT shows possible rectovaginal fistula.  No surgical intervention planned.  Today:   Glucose:  No hx DM, A1c 6.6 05/2018, however, steroid-induced hyperglycemia.  CBGs acceptable on TPN (range 108-152), but with elevated glucoses on CMET  Usually serum samples are more accurate, but suspect TPN may contaminate previous serum sample due to large discrepancy between CBG and serum glucose?  MD added low dose Lantus 5 units daily on 11/21  Electrolytes: Na low but improved (134), others WNL including corrected Ca   Renal: SCr wnl/stable, UOP and drain/stool output is adequate.  I/O +11.2 L this admission  LFTs: WNL, albumin low (1.3)  TGs: 79 (11/18)  Prealbumin:  5.2 (11/18)  NUTRITIONAL GOALS                                                                                             RD recs: (11/18): 1650-1850 kcal, 80-90g protein per day  Custom TPN at goal rate of 75 ml/hr provides: 90 g/day protein (50 g/L) 45 g/day Lipid  (25 g/L) 306 g/day Dextrose (17 %) 1850 Kcal/day   PLAN  At 1800 today:  ContinueTPN at goal rate of 75 ml/hr. -  Provides 90 g of protein, 306 g of dextrose, 45g Lipids which provides 1850 kCals per day, meeting 100 % of goal kcal and AA  Electrolytes in TPN: Continue increased Na 55 meq/L, K 55 meq/L and Phos 20 mmol/L, Cl:Ac ratio 1:1  TPN to contain standard multivitamins and trace elements.  Add Famotidine 20mg  to TPN.  IVF per MD (removed K from IVF on 11/22)  Continue moderate SSI q4h.  Continue Lantus 5 units daily per MD.  TPN lab panels on Mondays & Thursdays. BMET daily on Sat/Sun AM.  F/u daily.   Gretta Arab PharmD, BCPS Pager [phone] 07/11/2018 7:14 AM

## 2018-07-11 NOTE — Consult Note (Addendum)
[place] Nurse ostomy follow up Stoma type/location: RUQ transverse colostomy with pouch intact (applied yesterday).  Stool in pouch. Stomal assessment/size: 2-inch oval Peristomal assessment: not seen today Treatment options for stomal/peristomal skin: skin barrier ring Output : brown stool Ostomy pouching: 2pc., 2 and 3/4 inch pouching system with skin barrier ring. Education provided: None today. Supplies in room (2-piece pouches and skin barrier rings) Enrolled patient in [place] Start Discharge program: No  Patient is alone in room and resting, rouses easily and is pleasant. NPO. Still in ICU. Being medicated for discomfort, much less uncomfortable than yesterday, however. Palliative Care Consult requested today but not yet in. Findings from CT (fistula) noted.  [place] nursing team will follow, and will remain available to this patient, the nursing, surgical and medical teams.   Thanks, Maudie Flakes, MSN, RN, [place], Arther Abbott  Pager# [phone]

## 2018-07-12 LAB — BASIC METABOLIC PANEL
Anion gap: 5 (ref 5–15)
BUN: 21 mg/dL (ref 8–23)
CALCIUM: 7.7 mg/dL — AB (ref 8.9–10.3)
CO2: 24 mmol/L (ref 22–32)
CREATININE: 0.43 mg/dL — AB (ref 0.44–1.00)
Chloride: 107 mmol/L (ref 98–111)
GFR calc non Af Amer: >60 mL/min (ref >60)
GLUCOSE: 106 mg/dL — AB (ref 70–99)
Potassium: 4.2 mmol/L (ref 3.5–5.1)
Sodium: 136 mmol/L (ref 135–145)

## 2018-07-12 LAB — GLUCOSE, CAPILLARY
GLUCOSE-CAPILLARY: 124 mg/dL — AB (ref 70–99)
GLUCOSE-CAPILLARY: 159 mg/dL — AB (ref 70–99)
Glucose-Capillary: 124 mg/dL — ABNORMAL HIGH (ref 70–99)
Glucose-Capillary: 91 mg/dL (ref 70–99)
Glucose-Capillary: 112 mg/dL — ABNORMAL HIGH (ref 70–99)
Glucose-Capillary: 100 mg/dL — ABNORMAL HIGH (ref 70–99)

## 2018-07-12 MED ORDER — FENTANYL 12 MCG/HR TD PT72: 12.5000 ug | MEDICATED_PATCH | TRANSDERMAL | Status: DC

## 2018-07-12 MED ORDER — TRAVASOL 10 % IV SOLN
INTRAVENOUS | Status: AC
  Administered 2018-07-12: 18:00:00 via INTRAVENOUS
  Filled 2018-07-12: qty 900

## 2018-07-12 NOTE — Progress Notes (Signed)
[facility]:  [phone] General Surgery Progress Note   LOS: 17 days  POD -  9 Days Post-Op  Chief Complaint: Abdominal pan  Assessment and Plan: 1.  Perforated sigmoid colon with fecal contamination  Left colectomy, and transverse colostomy, takedown splenic flexure, 07/03/2018 Dr. Rolm Bookbinder   On clear liquids, though not taking much.   Okay to advance diet as tolerated.  2. CT for vaginal drainage 11/21:Possible rectovaginal fistula between the LEFT vaginal cuff and the rectum.   3.  Acute hypoxic respiratory failure-extubated 11/15 4.  HXsevereulcerativecolitis   Zosyn - 11/14 >> 5.  Hypertension 6.  History ofacute onchronic anemia -   H/H - 7.6/23.5 - 07/11/2018 7.  History of chronic atrial fibrillation-  on Xarelto at home 8.  Severe Malnutrition - prealbumin 5.2 - TNA per Medicine 9.  DVT: Lovenox 10.  DNR 11.  Palliative care to meet with family on Monday, 11/25 12.  Very deconditioned.   Principal Problem:   Feculent peritonitis from sigmoid perforation Active Problems:   Lower GI bleed   Ulcerative colitis with complication (HCC)   Abdominal cramping   Colitis, acute   Abdominal infection (HCC)   Constipation   Rectal bleeding   Pneumoperitoneum   Hyperglycemia   Chronic atrial fibrillation   Chronic anticoagulation   Acute respiratory failure with hypoxia (HCC)   Acute blood loss anemia   Acute on chronic anemia   Perforation of sigmoid colon s/p Hartmann colectomy/colostomy 07/03/2018  Subjective:  Just medicated.  Responds to questions and is thirsty.  Objective:   Vitals:   07/12/18 0730 07/12/18 0800  BP: (!) 160/87 (!) 193/80  Pulse: 60 (!) 54  Resp: 17 20  Temp:  (!) 97.5 F (36.4 C)  SpO2: 98% 100%     Intake/Output from previous day:  11/22 0701 - 11/23 0700 In: 2953.4 [P.O.:120; I.V.:2208.3; IV Piggyback:625.1] Out: 2120 [Urine:1820; Drains:150; Stool:150]  Intake/Output this  shift:  Total I/O In: 150 [I.V.:150] Out: -    Physical Exam:   General: Older sickly AA F who is alert.   HEENT: Normal. Pupils equal. .   Lungs: Modest inspiratory effort.  Pulled 600 cc on IS   Abdomen: Few BS.   Wound: Right sided ostomy functioning, midline wound clean, left sided drain   Lab Results:    Recent Labs    07/10/18 0534 07/11/18 0918  WBC 12.6* 13.1*  HGB 7.9* 7.6*  HCT 24.7* 23.5*  PLT 181 182    BMET   Recent Labs    07/11/18 0918 07/12/18 0451  NA 134* 136  K 4.4 4.2  CL 104 107  CO2 25 24  GLUCOSE 190* 106*  BUN 20 21  CREATININE 0.54 0.43*  CALCIUM 7.5* 7.7*    PT/INR  No results for input(s): LABPROT, INR in the last 72 hours.  ABG  No results for input(s): PHART, HCO3 in the last 72 hours.  Invalid input(s): PCO2, PO2   Studies/Results:  Ct Abdomen Pelvis Wo Contrast  Result Date: 07/10/2018 CLINICAL DATA:  Perforated sigmoid colon with fecal contamination. Status post LEFT colectomy in transverse colostomy, take down splenic flexure on 07/03/2018. "Midline abdominal wound is breaking down more this morning. She also has a liquid brownish stool appearing fluid coming from the vaginal area. She has flatus in her ostomy bag but no stool." EXAM: CT ABDOMEN AND PELVIS WITHOUT CONTRAST TECHNIQUE: Multidetector CT imaging of the abdomen and pelvis was performed following the standard protocol  without IV contrast. COMPARISON:  None. FINDINGS: Lower chest: There are increased bilateral pleural effusions and bibasilar atelectasis. Central line is partially imaged in the LOWER superior vena cava. There is dense atherosclerotic calcification of the coronary arteries. 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 There is atherosclerotic calcification of the thoracic aorta. Hepatobiliary: Layering debris/stones within the gallbladder. Gallbladder is distended. Pancreas: Unremarkable. No pancreatic ductal dilatation or surrounding inflammatory changes.  Spleen: Normal in size without focal abnormality. Adrenals/Urinary Tract: Punctate calcification identified in the midpole region of the RIGHT kidney. There is no hydronephrosis. The ureters are unremarkable. Urinary bladder is decompressed by a Foley catheter. No inflammatory changes surrounding the urinary bladder. Stomach/Bowel: Stomach and small bowel loops are normal in appearance. Status post transverse colostomy and resection of the distal colon. The rectal couch has a small amount of fluid within it. Small amount of stranding is identified between the rectal pouch in the LEFT vaginal cuff. See below. Surgical drain is identified within the pelvis. No loculated fluid collections are present. Vascular/Lymphatic: There is dense atherosclerotic calcification of the abdominal aorta. Reproductive: Hysterectomy. No adnexal mass. There is a possible communication of the LEFT vaginal cuff with the rectal couch, best seen on axial image 76/2 and sagittal image 55/5. No air identified within the vaginal cough. Other: Diffuse body wall edema. Large anterior abdominal wall surgical wound, not associated with focal fluid collection or obvious enterocutaneous fistula. No abscess. No free intraperitoneal air. Musculoskeletal: Degenerative changes throughout the thoracolumbar spine. IMPRESSION: 1. Interval resection of the distal colon and creation of rectal pouch. 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 2. No pelvic abscess or free intraperitoneal air. 3. Cholelithiasis. 4. Unremarkable appearance of the transverse colostomy. No evidence for bowel obstruction. 5. Increased bilateral effusions, bibasilar atelectasis, and body wall edema. 6. Aneurysmal aortic root. Recommend annual imaging followup by CTA or MRA. This recommendation follows 2010 ACCF/AHA/AATS/ACR/ASA/SCA/SCAI/SIR/STS/SVM Guidelines for the Diagnosis and Management of Patients with Thoracic Aortic Disease. Circulation. 2010;  121: [id number] 7. Hysterectomy. Electronically Signed   By: Nolon Nations M.D.   On: 07/10/2018 15:10   [place] Ekg Site Rite  Result Date: 07/10/2018 If Site Rite image not attached, placement could not be confirmed due to current cardiac rhythm.    Anti-infectives:   Anti-infectives (From admission, onward)   Start     Dose/Rate Route Frequency Ordered Stop   07/03/18 2200  piperacillin-tazobactam (ZOSYN) IVPB 3.375 g     3.375 g 12.5 mL/hr over 240 Minutes Intravenous Every 8 hours 07/03/18 2034     07/03/18 1200  piperacillin-tazobactam (ZOSYN) IVPB 3.375 g  Status:  Discontinued     3.375 g 12.5 mL/hr over 240 Minutes Intravenous Every 8 hours 07/03/18 0444 07/03/18 2012   07/03/18 0445  piperacillin-tazobactam (ZOSYN) IVPB 3.375 g     3.375 g 100 mL/hr over 30 Minutes Intravenous  Once 07/03/18 0440 07/03/18 0640   06/25/18 1600  cefTRIAXone (ROCEPHIN) 2 g in sodium chloride 0.9 % 100 mL IVPB  Status:  Discontinued     2 g 200 mL/hr over 30 Minutes Intravenous Every 24 hours 06/25/18 1546 06/27/18 1348   06/25/18 1600  metroNIDAZOLE (FLAGYL) IVPB 500 mg  Status:  Discontinued     500 mg 100 mL/hr over 60 Minutes Intravenous Every 8 hours 06/25/18 1546 06/27/18 1348      Alphonsa Overall, MD, FACS Pager: [facility]: [phone] 07/12/2018

## 2018-07-12 NOTE — Progress Notes (Signed)
PHARMACY - ADULT TOTAL PARENTERAL NUTRITION CONSULT NOTE   Pharmacy Consult for TPN Indication: s/p sigmoid colon perforation repair, postop ileus  Patient Measurements: Height: '5\' 1"'  (154.9 cm) Weight: 166 lb 14.2 oz (75.7 kg) IBW/kg (Calculated) : 47.8   Body mass index is 31.53 kg/m.  Insulin Requirements: 10 units of moderate SSI Lantus 5 units daily added on 11/21 by TRH  Current Nutrition: clear liquids, boost breeze, Unjury chicken soup ordered on 11/22 (boost breeze charted not given on 11/22, given this AM; Unjury chicken soup charted not given 11/22)  IVF: NS at 50 ml/hr  Central access: PICC TPN start date: 11/17  ASSESSMENT                                                                                                          HPI: Patient with PMH significant for ulcerative colitis, chronic afib on xarelto (not taking PTA), CKD, HTN, and breast cancer. Recently discharged from [facility] on 06/20/18 for UC flare and prescribed steroids. Presents this admission on 07/02/18 with abdominal cramping and constipation. CT abdomen showed large amount of free air.  Significant events:  11/14: colostomy and colectomy 11/17: start TPN 11/18 NG tube removed by patient.  No nausea, but essentially no ostomy output, no flatus. 11/20 Patient is SD-ICU status, start lipids in TPN today. 11/21 RN reports stool appearing fluid leaking from vaginal area.  CT shows possible rectovaginal fistula.  No surgical intervention planned.  Today:   Glucose:  No hx DM, A1c 6.6 05/2018, however, steroid-induced hyperglycemia.  CBGs acceptable on TPN (range 96-145)  MD added low dose Lantus 5 units daily on 11/21  Electrolytes: Na improved to WNL, others WNL including corrected Ca   Renal: SCr low/stable,I/O +11.3 L this admission  LFTs: AST low, ALT and Alk Phos WNL, Tbili WNL, albumin low at 1.3 (on 11/21)  TGs: 79 (11/18)  Prealbumin: 5.2 (11/18)  NUTRITIONAL GOALS                                                                                              RD recs: (11/22): 1650-1850 kcal, 80-90g protein per day  Custom TPN at goal rate of 75 ml/hr provides: 90 g/day protein (50 g/L) 45 g/day Lipid  (25 g/L) 306 g/day Dextrose (17 %) 1850 Kcal/day   PLAN  At 1800 today:  ContinueTPN at goal rate of 75 ml/hr. -  Provides 90 g of protein, 306 g of dextrose, 45g Lipids which provides 1850 kCals per day, meeting 100 % of goal kcal and AA  Electrolytes in TPN: Continue increased Na 55 meq/L, K 55 meq/L and Phos 20 mmol/L, Cl:Ac ratio 1:1  TPN to contain standard multivitamins and trace elements.  Continue Famotidine 57m to TPN.  IVF per MD  Continue moderate SSI q4h.  Continue Lantus 5 units daily per MD.  TPN lab panels on Mondays & Thursdays. BMET with Mag, Phos in AM.   F/u ability to tolerate diet.   F/u daily.    JLindell Spar PharmD, BCPS Pager: [phone]/23/2019 8:56 AM

## 2018-07-12 NOTE — Progress Notes (Signed)
[facility] PROGRESS NOTE    Progress Note  Felicia Carlson  SNK:[id number] DOB: 1925-07-30 DOA: 06/25/2018 PCP: Felicia Manes, MD     Brief Narrative:   Felicia Carlson is an 84 y.o. female past medical history significant of ulcerative colitis, chronic atrial fibrillation on Xarelto, recently discharged on 08/20/2017 for ulcerative colitis flares placed on steroids a flex sig during that time showed left-sided ulcerative colitis presented on 06/25/2018 for bright red blood per rectum and abdominal pain for a week, on 07/02/2018 patient had worsening abdominal pain a CT scan of the abdomen and pelvis showed free air, initially it showed no extravasation of oral contrast and the patient was nontoxic-appearing per chart, during this time the patient was felt not to require emergent surgery, initially also the patient was hesitant about proceeding with surgery however she deteriorated on 07/03/2018 at which time surgery recommended emergent exploratory laparotomy and when colectomy with colostomy on 07/03/2018  Assessment/Plan:   Pneumoperitoneum perforation of sigmoid: Status post left colectomy, colostomy on 07/03/2018: Likely due to ulcerative colitis. Currently on TPN, IV Zosyn for presumed intra-abdominal infection, antibiotic management per surgery. She is having persistent leukocytosis despite being on Zosyn however has remained afebrile. CT scan of the abdomen and pelvis on 07/10/2018: Show possible rectovaginal fistula, no pelvic abscess or intraperitoneal air, she does have choledocholithiasis He was placed on a fentanyl patch and IV Tylenol for pain control.  She relates her pain is better controlled today. She relates she has no appetite.  Acute hypoxic respiratory failure with hypoxia: Intubated on 07/03/2018, status post extubation on 07/04/2018. Currently on nasal oxygen saturating above 90%. Continue incentive spirometry.  Severe ulcerative colitis with new possible  rectovaginal fistula: Flex sig on 06/26/2018 showed persistent thickening of the abdominal wall. Continue to hold steroids per surgery recommendations. Further management per surgery.  Irregular rhythm: Rate controlled sinus rhythm with multiple PACs.  Essential hypertension: Blood pressure is high. We will continue IV metoprolol scheduled.  Chronic atrial fibrillation: Chads VASC 5, holding Xarelto. Was discussed with family and will discontinue Xarelto, they understand the potential risk and benefits. We will continue Lovenox subcutaneously.  Acute blood loss anemia/ Acute on chronic anemia Globin has been stable ranging from 8.5-8.1. Hemoglobin has remained stable she is status post 1 unit of packed red blood cells on 07/07/1999 -1. To monitor hemoglobin intermittently.  Hyperglycemia: Likely due to steroids, she is n.p.o. and TPN.  Hematuria: Improving.  Felicia Carlson traumatic.  Third spacing: We will have to be judicious with IV fluids, she has bilateral pleural effusion and body wall edema  RN Pressure Injury Documentation:    Estimated body mass index is 31.53 kg/m as calculated from the following:   Height as of this encounter: 5\' 1"  (1.549 m).   Weight as of this encounter: 75.7 kg. Malnutrition Type:  Nutrition Problem: Inadequate oral intake Etiology: decreased appetite   Malnutrition Characteristics:  Signs/Symptoms: per patient/family report   Nutrition Interventions:  Interventions: Refer to RD note for recommendations    DVT prophylaxis: lovenox Family Communication:none Disposition Plan/Barrier to D/C: unable to determine Code Status:     Code Status Orders  (From admission, onward)         Start     Ordered   07/05/18 1038  Do not attempt resuscitation (DNR)  Continuous    Question Answer Comment  In the event of cardiac or respiratory ARREST Do not call a "code blue"   In the event of cardiac or respiratory  ARREST Do not perform  Intubation, CPR, defibrillation or ACLS   In the event of cardiac or respiratory ARREST Use medication by any route, position, wound care, and other measures to relive pain and suffering. May use oxygen, suction and manual treatment of airway obstruction as needed for comfort.      07/05/18 1038        Code Status History    Date Active Date Inactive Code Status Order ID Comments User Context   07/03/2018 2012 07/05/2018 1038 Full Code [id number]  Rolm Bookbinder, MD Inpatient   06/25/2018 1825 07/03/2018 2012 DNR [id number]  [name], MD Inpatient   06/25/2018 1825 06/25/2018 1825 Full Code [id number]  [name], MD Inpatient   06/13/2018 2323 06/20/2018 1619 DNR [id number]  [name], MD Inpatient   10/26/2014 1912 10/30/2014 1814 Full Code [id number]  [name], MD Inpatient    Advance Directive Documentation     Most Recent Value  Type of Advance Directive  Healthcare Power of Attorney  Pre-existing out of facility DNR order (yellow form or pink MOST form)  -  "MOST" Form in Place?  -        IV Access:    Peripheral IV   Procedures and diagnostic studies:   Ct Abdomen Pelvis Wo Contrast  Result Date: 07/10/2018 CLINICAL DATA:  Perforated sigmoid colon with fecal contamination. Status post LEFT colectomy in transverse colostomy, take down splenic flexure on 07/03/2018. "Midline abdominal wound is breaking down more this morning. She also has a liquid brownish stool appearing fluid coming from the vaginal area. She has flatus in her ostomy bag but no stool." EXAM: CT ABDOMEN AND PELVIS WITHOUT CONTRAST TECHNIQUE: Multidetector CT imaging of the abdomen and pelvis was performed following the standard protocol without IV contrast. COMPARISON:  None. FINDINGS: Lower chest: There are increased bilateral pleural effusions and bibasilar atelectasis. Central line is partially imaged in the LOWER superior vena cava. There is dense atherosclerotic  calcification of the coronary arteries. 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 There is atherosclerotic calcification of the thoracic aorta. Hepatobiliary: Layering debris/stones within the gallbladder. Gallbladder is distended. Pancreas: Unremarkable. No pancreatic ductal dilatation or surrounding inflammatory changes. Spleen: Normal in size without focal abnormality. Adrenals/Urinary Tract: Punctate calcification identified in the midpole region of the RIGHT kidney. There is no hydronephrosis. The ureters are unremarkable. Urinary bladder is decompressed by a Foley catheter. No inflammatory changes surrounding the urinary bladder. Stomach/Bowel: Stomach and small bowel loops are normal in appearance. Status post transverse colostomy and resection of the distal colon. The rectal couch has a small amount of fluid within it. Small amount of stranding is identified between the rectal pouch in the LEFT vaginal cuff. See below. Surgical drain is identified within the pelvis. No loculated fluid collections are present. Vascular/Lymphatic: There is dense atherosclerotic calcification of the abdominal aorta. Reproductive: Hysterectomy. No adnexal mass. There is a possible communication of the LEFT vaginal cuff with the rectal couch, best seen on axial image 76/2 and sagittal image 55/5. No air identified within the vaginal cough. Other: Diffuse body wall edema. Large anterior abdominal wall surgical wound, not associated with focal fluid collection or obvious enterocutaneous fistula. No abscess. No free intraperitoneal air. Musculoskeletal: Degenerative changes throughout the thoracolumbar spine. IMPRESSION: 1. Interval resection of the distal colon and creation of rectal pouch. 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 2. No pelvic abscess or free intraperitoneal air. 3. Cholelithiasis. 4. Unremarkable appearance  of the transverse colostomy. No evidence for bowel obstruction.  5. Increased bilateral effusions, bibasilar atelectasis, and body wall edema. 6. Aneurysmal aortic root. Recommend annual imaging followup by CTA or MRA. This recommendation follows 2010 ACCF/AHA/AATS/ACR/ASA/SCA/SCAI/SIR/STS/SVM Guidelines for the Diagnosis and Management of Patients with Thoracic Aortic Disease. Circulation. 2010; 121: e266-e369 7. Hysterectomy. Electronically Signed   By: Nolon Nations M.D.   On: 07/10/2018 15:10   [place] Ekg Site Rite  Result Date: 07/10/2018 If Site Rite image not attached, placement could not be confirmed due to current cardiac rhythm.    Medical Consultants:    None.  Anti-Infectives:   Zosyn  Subjective:    Felicia Carlson thinks today that her pain is better controlled.  Has no appetite. Objective:    Vitals:   07/12/18 0500 07/12/18 0600 07/12/18 0630 07/12/18 0700  BP: 121/70 (!) 171/76 (!) 142/75 (!) 153/72  Pulse: 72 88 (!) 107 (!) 57  Resp: 15  16 17   Temp:      TempSrc:      SpO2: 98% 93% 98% 100%  Weight:      Height:        Intake/Output Summary (Last 24 hours) at 07/12/2018 0717 Last data filed at 07/12/2018 4656 Gross per 24 hour  Intake 2953.41 ml  Output 2120 ml  Net 833.41 ml   Filed Weights   07/03/18 1900 07/04/18 0400 07/11/18 1114  Weight: 70 kg 70.3 kg 75.7 kg    Exam: General exam: In no acute distress. Respiratory system: Good air movement and clear to auscultation. Cardiovascular system: S1 & S2 heard, RRR. Gastrointestinal system: Abdomen is nondistended, soft and ostomy in place. Central nervous system: Alert and oriented. No focal neurological deficits. Extremities: No pedal edema. Skin: No rashes, lesions or ulcers Psychiatry: Judgement and insight appear normal. Mood & affect appropriate.    Data Reviewed:    Labs: Basic Metabolic Panel: Recent Labs  Lab 07/06/18 0315 07/07/18 0519 07/08/18 0603 07/09/18 0539 07/10/18 0534 07/11/18 0918 07/12/18 0451  NA 137 138 135 130* 131* 134*  136  K 3.6 3.4* 4.0 4.0 4.3 4.4 4.2  CL 104 107 103 100 99 104 107  CO2 28 26 26 27 26 25 24   GLUCOSE 82 173* 171* 237* 274* 190* 106*  BUN 11 10 11 14 20 20 21   CREATININE 0.52 0.49 0.44 0.46 0.41* 0.54 0.43*  CALCIUM 7.6* 7.4* 7.4* 7.3* 7.3* 7.5* 7.7*  MG 2.0 1.9 1.9 2.0 2.1  --   --   PHOS 2.4* 1.6* 2.1* 2.7 3.1  --   --    GFR Estimated Creatinine Clearance: 43.5 mL/min (A) (by C-G formula based on SCr of 0.43 mg/dL (L)). Liver Function Tests: Recent Labs  Lab 07/06/18 0315 07/07/18 0519 07/08/18 0603 07/09/18 0539 07/10/18 0534  AST 13* 12* 12* 12* 10*  ALT 14 13 12 10 9   ALKPHOS 79 67 69 70 70  BILITOT 0.8 0.5 0.5 0.6 0.4  PROT 4.3* 4.0* 4.2* 4.0* 4.0*  ALBUMIN 1.7* 1.5* 1.5* 1.4* 1.3*   No results for input(s): LIPASE, AMYLASE in the last 168 hours. No results for input(s): AMMONIA in the last 168 hours. Coagulation profile No results for input(s): INR, PROTIME in the last 168 hours.  CBC: Recent Labs  Lab 07/06/18 0228 07/07/18 0519 07/07/18 2000 07/08/18 0603 07/10/18 0534 07/11/18 0918  WBC 7.7 8.3  --  12.1* 12.6* 13.1*  NEUTROABS  --  6.7  --   --   --   --  HGB 7.2* 6.5* 8.3* 8.1* 7.9* 7.6*  HCT 23.2* 21.2* 25.4* 25.4* 24.7* 23.5*  MCV 95.5 96.4  --  91.7 93.6 95.1  PLT 191 190  --  183 181 182   Cardiac Enzymes: No results for input(s): CKTOTAL, CKMB, CKMBINDEX, TROPONINI in the last 168 hours. BNP (last 3 results) No results for input(s): PROBNP in the last 8760 hours. CBG: Recent Labs  Lab 07/11/18 0746 07/11/18 1137 07/11/18 1524 07/11/18 1923 07/12/18 0327  GLUCAP 145* 143* 138* 96 124*   D-Dimer: No results for input(s): DDIMER in the last 72 hours. Hgb A1c: No results for input(s): HGBA1C in the last 72 hours. Lipid Profile: No results for input(s): CHOL, HDL, LDLCALC, TRIG, CHOLHDL, LDLDIRECT in the last 72 hours. Thyroid function studies: No results for input(s): TSH, T4TOTAL, T3FREE, THYROIDAB in the last 72  hours.  Invalid input(s): FREET3 Anemia work up: No results for input(s): VITAMINB12, FOLATE, FERRITIN, TIBC, IRON, RETICCTPCT in the last 72 hours. Sepsis Labs: Recent Labs  Lab 07/07/18 0519 07/08/18 0603 07/10/18 0534 07/11/18 0918  WBC 8.3 12.1* 12.6* 13.1*   Microbiology Recent Results (from the past 240 hour(s))  Culture, blood (single)     Status: None   Collection Time: 07/03/18  1:42 AM  Result Value Ref Range Status   Specimen Description   Final    BLOOD LEFT HAND Performed at [facility], [place] [address], [place], [place] [address]    Special Requests   Final    BOTTLES DRAWN AEROBIC ONLY Blood Culture adequate volume Performed at [place] [address] [place], [place] [address]    Culture   Final    NO GROWTH 5 DAYS Performed at [facility], [place] [address], [place], [place] [address]    Report Status 07/08/2018 FINAL  Final  MRSA PCR Screening     Status: None   Collection Time: 07/03/18  7:33 PM  Result Value Ref Range Status   MRSA by PCR NEGATIVE NEGATIVE Final    Comment:        The GeneXpert MRSA Assay (FDA approved for NASAL specimens only), is one component of a comprehensive MRSA colonization surveillance program. It is not intended to diagnose MRSA infection nor to guide or monitor treatment for MRSA infections. Performed at [facility], [place] [address], [place],  [address]      Medications:   . chlorhexidine  15 mL Mouth Rinse BID  . Chlorhexidine Gluconate Cloth  6 each Topical Daily  . enoxaparin (LOVENOX) injection  40 mg Subcutaneous Q24H  . feeding supplement  1 Container Oral BID BM  . fentaNYL  12.5 mcg Transdermal Q72H  . insulin aspart  0-15 Units Subcutaneous Q4H  . insulin glargine  5 Units Subcutaneous QHS  . mouth rinse  15 mL Mouth Rinse q12n4p  . metoprolol tartrate  10 mg Intravenous Q6H  . protein supplement  8 oz Oral Daily  . sodium  chloride flush  10-40 mL Intracatheter Q12H   Continuous Infusions: . sodium chloride Stopped (07/12/18 0122)  . sodium chloride 50 mL/hr at 07/12/18 0300  . acetaminophen Stopped (07/12/18 0536)  . [START ON 07/13/2018] acetaminophen    . piperacillin-tazobactam (ZOSYN)  IV Stopped (07/12/18 0522)  . TPN ADULT (ION) 75 mL/hr at 07/12/18 0600      LOS: 17 days   [name]  [facility]   *Please refer to [email or web address], password [email or web address] to get updated  schedule on who will round on this patient, as hospitalists switch teams weekly. If 7PM-7AM, please contact night-coverage at [email or web address], password [email or web address] for any overnight needs.  07/12/2018, 7:17 AM

## 2018-07-13 LAB — CBC
HEMATOCRIT: 23.2 % — AB (ref 36.0–46.0)
HEMOGLOBIN: 7.3 g/dL — AB (ref 12.0–15.0)
MCH: 29.9 pg (ref 26.0–34.0)
MCHC: 31.5 g/dL (ref 30.0–36.0)
MCV: 95.1 fL (ref 80.0–100.0)
NRBC: 0 % (ref 0.0–0.2)
PLATELETS: 210 10*3/uL (ref 150–400)
RBC: 2.44 MIL/uL — AB (ref 3.87–5.11)
RDW: 17.5 % — AB (ref 11.5–15.5)
WBC: 16.6 10*3/uL — ABNORMAL HIGH (ref 4.0–10.5)

## 2018-07-13 LAB — BASIC METABOLIC PANEL
Anion gap: 9 (ref 5–15)
BUN: 18 mg/dL (ref 8–23)
CALCIUM: 7.3 mg/dL — AB (ref 8.9–10.3)
CO2: 23 mmol/L (ref 22–32)
CREATININE: 0.46 mg/dL (ref 0.44–1.00)
Chloride: 104 mmol/L (ref 98–111)
GFR calc Af Amer: >60 mL/min (ref >60)
GLUCOSE: 125 mg/dL — AB (ref 70–99)
Potassium: 3.9 mmol/L (ref 3.5–5.1)
SODIUM: 136 mmol/L (ref 135–145)

## 2018-07-13 LAB — GLUCOSE, CAPILLARY
Glucose-Capillary: 121 mg/dL — ABNORMAL HIGH (ref 70–99)
Glucose-Capillary: 124 mg/dL — ABNORMAL HIGH (ref 70–99)
Glucose-Capillary: 149 mg/dL — ABNORMAL HIGH (ref 70–99)
Glucose-Capillary: 157 mg/dL — ABNORMAL HIGH (ref 70–99)
Glucose-Capillary: 157 mg/dL — ABNORMAL HIGH (ref 70–99)
Glucose-Capillary: 178 mg/dL — ABNORMAL HIGH (ref 70–99)
Glucose-Capillary: 81 mg/dL (ref 70–99)

## 2018-07-13 LAB — PHOSPHORUS: Phosphorus: 2.6 mg/dL (ref 2.5–4.6)

## 2018-07-13 LAB — MAGNESIUM: MAGNESIUM: 1.9 mg/dL (ref 1.7–2.4)

## 2018-07-13 MED ORDER — TRAVASOL 10 % IV SOLN
INTRAVENOUS | Status: AC
  Administered 2018-07-13: 18:00:00 via INTRAVENOUS
  Filled 2018-07-13: qty 900

## 2018-07-13 MED ORDER — METOPROLOL SUCCINATE ER 25 MG PO TB24
150.0000 mg | ORAL_TABLET | Freq: Every day | ORAL | Status: DC
  Administered 2018-07-13 – 2018-07-17 (×5): 150 mg via ORAL
  Filled 2018-07-13 (×5): qty 6

## 2018-07-13 NOTE — Progress Notes (Signed)
[facility]:  [phone] General Surgery Progress Note   LOS: 18 days  POD -  10 Days Post-Op  Chief Complaint: Abdominal pan  Assessment and Plan: 1.  Perforated sigmoid colon with fecal contamination  Left colectomy, and transverse colostomy, takedown splenic flexure, 07/03/2018 - Rolm Bookbinder   Left sided drain removed  More alert today - to advance to full luquids  2. CT for vaginal drainage 11/21  Possible rectovaginal fistula between the LEFT vaginal cuff and the rectum.   3.  Acute hypoxic respiratory failure- extubated 11/15 4.  HXsevereulcerativecolitis   Zosyn - 11/14 >> 5.  Hypertension 6.  History ofacute onchronic anemia -   H/H - 7.6/23.5 - 07/11/2018 7.  History of chronic atrial fibrillation-  on Xarelto at home 8.  Severe Malnutrition - prealbumin 5.2 -07/07/2018  TNA per Medicine  She should get labs tomorrow AM 9.  DVT: Lovenox 10.  DNR 11.  Palliative care to meet with family on Monday, 11/25 12.  Very deconditioned.   Principal Problem:   Feculent peritonitis from sigmoid perforation Active Problems:   Lower GI bleed   Ulcerative colitis with complication (HCC)   Abdominal cramping   Colitis, acute   Abdominal infection (HCC)   Constipation   Rectal bleeding   Pneumoperitoneum   Hyperglycemia   Chronic atrial fibrillation   Chronic anticoagulation   Acute respiratory failure with hypoxia (HCC)   Acute blood loss anemia   Acute on chronic anemia   Perforation of sigmoid colon s/p Hartmann colectomy/colostomy 07/03/2018  Subjective:  More alert today and looks better.  I don't think that she got out of bed yesterday - needs to ambulate.  Objective:   Vitals:   07/13/18 0600 07/13/18 0630  BP: (!) 132/46 (!) 134/23  Pulse: 77 62  Resp: (!) 26 (!) 22  Temp:    SpO2: (!) 84% 96%     Intake/Output from previous day:  11/23 0701 - 11/24 0700 In: 3534.1 [P.O.:240; I.V.:2898.9; IV  Piggyback:395.2] Out: 2130 [Urine:1855; Drains:225; Stool:50]  Intake/Output this shift:  No intake/output data recorded.   Physical Exam:   General: Older AA F who is alert.   HEENT: Normal. Pupils equal. .   Lungs: Modest inspiratory effort.     Abdomen: Few BS.   Wound: Right sided ostomy functioning, midline wound clean, left sided drain - removed   Lab Results:    Recent Labs    07/11/18 0918  WBC 13.1*  HGB 7.6*  HCT 23.5*  PLT 182    BMET   Recent Labs    07/12/18 0451 07/13/18 0445  NA 136 136  K 4.2 3.9  CL 107 104  CO2 24 23  GLUCOSE 106* 125*  BUN 21 18  CREATININE 0.43* 0.46  CALCIUM 7.7* 7.3*    PT/INR  No results for input(s): LABPROT, INR in the last 72 hours.  ABG  No results for input(s): PHART, HCO3 in the last 72 hours.  Invalid input(s): PCO2, PO2   Studies/Results:  No results found.   Anti-infectives:   Anti-infectives (From admission, onward)   Start     Dose/Rate Route Frequency Ordered Stop   07/03/18 2200  piperacillin-tazobactam (ZOSYN) IVPB 3.375 g     3.375 g 12.5 mL/hr over 240 Minutes Intravenous Every 8 hours 07/03/18 2034     07/03/18 1200  piperacillin-tazobactam (ZOSYN) IVPB 3.375 g  Status:  Discontinued     3.375 g 12.5 mL/hr over 240 Minutes Intravenous  Every 8 hours 07/03/18 0444 07/03/18 2012   07/03/18 0445  piperacillin-tazobactam (ZOSYN) IVPB 3.375 g     3.375 g 100 mL/hr over 30 Minutes Intravenous  Once 07/03/18 0440 07/03/18 0640   06/25/18 1600  cefTRIAXone (ROCEPHIN) 2 g in sodium chloride 0.9 % 100 mL IVPB  Status:  Discontinued     2 g 200 mL/hr over 30 Minutes Intravenous Every 24 hours 06/25/18 1546 06/27/18 1348   06/25/18 1600  metroNIDAZOLE (FLAGYL) IVPB 500 mg  Status:  Discontinued     500 mg 100 mL/hr over 60 Minutes Intravenous Every 8 hours 06/25/18 1546 06/27/18 1348      Alphonsa Overall, MD, FACS Pager: [facility]: [phone] 07/13/2018

## 2018-07-13 NOTE — Progress Notes (Addendum)
PHARMACY - ADULT TOTAL PARENTERAL NUTRITION CONSULT NOTE   Pharmacy Consult for TPN Indication: s/p sigmoid colon perforation repair, postop ileus  Patient Measurements: Height: 5' 1" (154.9 cm) Weight: 180 lb 8.9 oz (81.9 kg) IBW/kg (Calculated) : 47.8   Body mass index is 34.12 kg/m.  Insulin Requirements: 8 units of moderate SSI Lantus 5 units daily added on 11/21 by TR[facility]  Current Nutrition: clear liquids advanced to full liquids today; boost breeze, Unjury chicken soup (charted given on 11/23)  IVF: NS at 50 ml/hr  Central access: PICC TPN start date: 11/17  ASSESSMENT                                                                                                          HPI: Patient with PMH significant for ulcerative colitis, chronic afib on xarelto (not taking PTA), CKD, HTN, and breast cancer. Recently discharged from [facility] on 06/20/18 for UC flare and prescribed steroids. Presents this admission on 07/02/18 with abdominal cramping and constipation. CT abdomen showed large amount of free air.  Significant events:  11/14: colostomy and colectomy 11/17: start TPN 11/18 NG tube removed by patient.  No nausea, but essentially no ostomy output, no flatus. 11/20 Patient is SD-ICU status, start lipids in TPN today. 11/21 RN reports stool appearing fluid leaking from vaginal area.  CT shows possible rectovaginal fistula.  No surgical intervention planned.  Today:   Glucose:  No hx DM, A1c 6.6 05/2018. CBG range 81-159  MD added low dose Lantus 5 units daily on 11/21  Electrolytes: WNL including corrected Ca  Renal: SCr WNL/stable,I/O +11.2 L this admission  LFTs: AST low, ALT and Alk Phos WNL, Tbili WNL, albumin low at 1.3 (on 11/21)  TGs: 79 (11/18)  Prealbumin: 5.2 (11/18)  NUTRITIONAL GOALS                                                                                             RD recs: (11/22): 1650-1850 kcal, 80-90g protein per day  Custom TPN at goal rate of  75 ml/hr provides: 90 g/day protein (50 g/L) 45 g/day Lipid  (25 g/L) 306 g/day Dextrose (17 %) 1850 Kcal/day   PLAN  At 1800 today:  Continue TPN at goal rate of 75 ml/hr. -  Provides 90 g of protein, 306 g of dextrose, 45g Lipids which provides 1850 kCals per day, meeting 100 % of goal kcal and AA  Electrolytes in TPN: Continue increased Na 55 meq/L. Increase K+ slightly to 60 meq/L and Phos to 25 mmol/L as both on lower end of normal range; Cl:Ac ratio 1:1  TPN to contain standard multivitamins and trace elements.  Continue Famotidine 25m in TPN.  IVF per MD  Continue moderate SSI q4h.  Continue Lantus 5 units daily per MD.  TPN lab panels on Mondays & Thursdays.    F/u advancement of diet and ability to wean TPN.    F/u daily.   JLindell Spar PharmD, BCPS Pager: [phone]/24/2019 9:35 AM

## 2018-07-13 NOTE — Progress Notes (Signed)
[facility] PROGRESS NOTE    Progress Note  Felicia Carlson  OFB:[id number] DOB: 1924-11-17 DOA: 06/25/2018 PCP: Lajean Manes, MD     Brief Narrative:   Felicia Carlson is an 84 y.o. female past medical history significant of ulcerative colitis, chronic atrial fibrillation on Xarelto, recently discharged on 08/20/2017 for ulcerative colitis flares placed on steroids a flex sig during that time showed left-sided ulcerative colitis presented on 06/25/2018 for bright red blood per rectum and abdominal pain for a week, on 07/02/2018 patient had worsening abdominal pain a CT scan of the abdomen and pelvis showed free air, initially it showed no extravasation of oral contrast and the patient was nontoxic-appearing per chart, during this time the patient was felt not to require emergent surgery, initially also the patient was hesitant about proceeding with surgery however she deteriorated on 07/03/2018 at which time surgery recommended emergent exploratory laparotomy and when colectomy with colostomy on 07/03/2018  Assessment/Plan:   Pneumoperitoneum perforation of sigmoid: Status post left colectomy, colostomy on 07/03/2018: Likely due to ulcerative colitis. Currently on TPN, IV Zosyn for presumed intra-abdominal infection, antibiotic management per surgery. CT scan of the abdomen and pelvis on 07/10/2018: Show possible rectovaginal fistula, no pelvic abscess or intraperitoneal air, she does have choledocholithiasis CBC today is pending, she has remained afebrile. She relates her pain is controlled today. Talking relating that she is hungry, patient feels and looks in a better mood today. She also relates that she is ready to get up and wants to get to the chair.  Acute hypoxic respiratory failure with hypoxia: Intubated on 07/03/2018, status post extubation on 07/04/2018. Currently on nasal oxygen saturating above 90%. Continue incentive spirometry.  Severe ulcerative colitis with new possible  rectovaginal fistula: Flex sig on 06/26/2018 showed persistent thickening of the abdominal wall. Continue to hold steroids per surgery recommendations. Further management per surgery.  Essential hypertension: Blood pressure better controlled.  Chronic atrial fibrillation: Chads VASC 5, holding Xarelto. Due to hold Xarelto, Lovenox subcutaneously. She is sinus rhythm rate controlled.  Change her metoprolol to oral.  Acute blood loss anemia/ Acute on chronic anemia Globin has been stable ranging from 8.5-8.1. Hemoglobin has remained stable she is status post 1 unit of packed red blood cells on 07/07/1999 -1. To monitor hemoglobin intermittently.  Hyperglycemia: Likely due to steroids, she is n.p.o. and TPN.  Hematuria: Improving.  Sherlynn traumatic.  Third spacing: We will have to be judicious with IV fluids, she has bilateral pleural effusion and body wall edema  RN Pressure Injury Documentation:    Estimated body mass index is 31.53 kg/m as calculated from the following:   Height as of this encounter: 5\' 1"  (1.549 m).   Weight as of this encounter: 75.7 kg. Malnutrition Type:  Nutrition Problem: Inadequate oral intake Etiology: decreased appetite   Malnutrition Characteristics:  Signs/Symptoms: per patient/family report   Nutrition Interventions:  Interventions: Refer to RD note for recommendations    DVT prophylaxis: lovenox Family Communication:none Disposition Plan/Barrier to D/C: unable to determine Code Status:     Code Status Orders  (From admission, onward)         Start     Ordered   07/05/18 1038  Do not attempt resuscitation (DNR)  Continuous    Question Answer Comment  In the event of cardiac or respiratory ARREST Do not call a "code blue"   In the event of cardiac or respiratory ARREST Do not perform Intubation, CPR, defibrillation or ACLS   In  the event of cardiac or respiratory ARREST Use medication by any route, position, wound care,  and other measures to relive pain and suffering. May use oxygen, suction and manual treatment of airway obstruction as needed for comfort.      07/05/18 1038        Code Status History    Date Active Date Inactive Code Status Order ID Comments User Context   07/03/2018 2012 07/05/2018 1038 Full Code [id number]  Rolm Bookbinder, MD Inpatient   06/25/2018 1825 07/03/2018 2012 DNR [id number]  [name], MD Inpatient   06/25/2018 1825 06/25/2018 1825 Full Code [id number]  [name], MD Inpatient   06/13/2018 2323 06/20/2018 1619 DNR [id number]  [name], MD Inpatient   10/26/2014 1912 10/30/2014 1814 Full Code [id number]  [name], MD Inpatient    Advance Directive Documentation     Most Recent Value  Type of Advance Directive  Healthcare Power of Attorney  Pre-existing out of facility DNR order (yellow form or pink MOST form)  -  "MOST" Form in Place?  -        IV Access:    Peripheral IV   Procedures and diagnostic studies:   No results found.   Medical Consultants:    None.  Anti-Infectives:   Zosyn  Subjective:    Felicia Carlson patient is in a better mood today, she relates she is hungry. Objective:    Vitals:   07/13/18 0500 07/13/18 0530 07/13/18 0600 07/13/18 0630  BP: 132/64 (!) 124/56 (!) 132/46 (!) 134/23  Pulse: 81 67 77 62  Resp: (!) 25 (!) 25 (!) 26 (!) 22  Temp:      TempSrc:      SpO2: 95% 96% (!) 84% 96%  Weight:      Height:        Intake/Output Summary (Last 24 hours) at 07/13/2018 0727 Last data filed at 07/13/2018 0500 Gross per 24 hour  Intake 3534.13 ml  Output 2130 ml  Net 1404.13 ml   Filed Weights   07/03/18 1900 07/04/18 0400 07/11/18 1114  Weight: 70 kg 70.3 kg 75.7 kg    Exam: General exam: In no acute distress. Respiratory system: Good air movement and clear to auscultation. Cardiovascular system: S1 & S2 heard, RRR. Gastrointestinal system: Abdomen is nondistended, soft and  ostomy in place. Central nervous system: Alert and oriented. No focal neurological deficits. Extremities: No pedal edema. Skin: No rashes, lesions or ulcers Psychiatry: Judgement and insight appear normal. Mood & affect appropriate.    Data Reviewed:    Labs: Basic Metabolic Panel: Recent Labs  Lab 07/07/18 0519 07/08/18 0603 07/09/18 0539 07/10/18 0534 07/11/18 0918 07/12/18 0451 07/13/18 0445  NA 138 135 130* 131* 134* 136 136  K 3.4* 4.0 4.0 4.3 4.4 4.2 3.9  CL 107 103 100 99 104 107 104  CO2 26 26 27 26 25 24 23   GLUCOSE 173* 171* 237* 274* 190* 106* 125*  BUN 10 11 14 20 20 21 18   CREATININE 0.49 0.44 0.46 0.41* 0.54 0.43* 0.46  CALCIUM 7.4* 7.4* 7.3* 7.3* 7.5* 7.7* 7.3*  MG 1.9 1.9 2.0 2.1  --   --  1.9  PHOS 1.6* 2.1* 2.7 3.1  --   --  2.6   GFR Estimated Creatinine Clearance: 43.5 mL/min (by C-G formula based on SCr of 0.46 mg/dL). Liver Function Tests: Recent Labs  Lab 07/07/18 0519 07/08/18 0603 07/09/18 0539 07/10/18 0534  AST 12* 12* 12* 10*  ALT 13 12 10 9   ALKPHOS 67 69 70 70  BILITOT 0.5 0.5 0.6 0.4  PROT 4.0* 4.2* 4.0* 4.0*  ALBUMIN 1.5* 1.5* 1.4* 1.3*   No results for input(s): LIPASE, AMYLASE in the last 168 hours. No results for input(s): AMMONIA in the last 168 hours. Coagulation profile No results for input(s): INR, PROTIME in the last 168 hours.  CBC: Recent Labs  Lab 07/07/18 0519 07/07/18 2000 07/08/18 0603 07/10/18 0534 07/11/18 0918  WBC 8.3  --  12.1* 12.6* 13.1*  NEUTROABS 6.7  --   --   --   --   HGB 6.5* 8.3* 8.1* 7.9* 7.6*  HCT 21.2* 25.4* 25.4* 24.7* 23.5*  MCV 96.4  --  91.7 93.6 95.1  PLT 190  --  183 181 182   Cardiac Enzymes: No results for input(s): CKTOTAL, CKMB, CKMBINDEX, TROPONINI in the last 168 hours. BNP (last 3 results) No results for input(s): PROBNP in the last 8760 hours. CBG: Recent Labs  Lab 07/12/18 1203 07/12/18 1545 07/12/18 1935 07/13/18 0012 07/13/18 0352  GLUCAP 112* 159* 100* 157*  121*   D-Dimer: No results for input(s): DDIMER in the last 72 hours. Hgb A1c: No results for input(s): HGBA1C in the last 72 hours. Lipid Profile: No results for input(s): CHOL, HDL, LDLCALC, TRIG, CHOLHDL, LDLDIRECT in the last 72 hours. Thyroid function studies: No results for input(s): TSH, T4TOTAL, T3FREE, THYROIDAB in the last 72 hours.  Invalid input(s): FREET3 Anemia work up: No results for input(s): VITAMINB12, FOLATE, FERRITIN, TIBC, IRON, RETICCTPCT in the last 72 hours. Sepsis Labs: Recent Labs  Lab 07/07/18 0519 07/08/18 0603 07/10/18 0534 07/11/18 0918  WBC 8.3 12.1* 12.6* 13.1*   Microbiology Recent Results (from the past 240 hour(s))  MRSA PCR Screening     Status: None   Collection Time: 07/03/18  7:33 PM  Result Value Ref Range Status   MRSA by PCR NEGATIVE NEGATIVE Final    Comment:        The GeneXpert MRSA Assay (FDA approved for NASAL specimens only), is one component of a comprehensive MRSA colonization surveillance program. It is not intended to diagnose MRSA infection nor to guide or monitor treatment for MRSA infections. Performed at [facility], [place] [address], [place], [place] [address]      Medications:   . chlorhexidine  15 mL Mouth Rinse BID  . Chlorhexidine Gluconate Cloth  6 each Topical Daily  . enoxaparin (LOVENOX) injection  40 mg Subcutaneous Q24H  . feeding supplement  1 Container Oral BID BM  . [START ON 07/15/2018] fentaNYL  12.5 mcg Transdermal Q72H  . insulin aspart  0-15 Units Subcutaneous Q4H  . insulin glargine  5 Units Subcutaneous QHS  . mouth rinse  15 mL Mouth Rinse q12n4p  . metoprolol tartrate  10 mg Intravenous Q6H  . protein supplement  8 oz Oral Daily  . sodium chloride flush  10-40 mL Intracatheter Q12H   Continuous Infusions: . sodium chloride Stopped (07/12/18 0122)  . sodium chloride 50 mL/hr at 07/13/18 0500  . acetaminophen Stopped (07/13/18 9924)  . piperacillin-tazobactam  (ZOSYN)  IV Stopped (07/13/18 0556)  . TPN ADULT (ION) 75 mL/hr at 07/13/18 0500      LOS: 18 days   [name]  [facility]   *Please refer to [email or web address], password [email or web address] to get updated schedule on who will round on this patient, as hospitalists switch teams weekly. If 7PM-7AM, please contact night-coverage at [email or web address], password  [email or web address] for any overnight needs.  07/13/2018, 7:27 AM

## 2018-07-14 DIAGNOSIS — M5416 Radiculopathy, lumbar region: Secondary | ICD-10-CM

## 2018-07-14 LAB — DIFFERENTIAL
Abs Immature Granulocytes: 0.19 10*3/uL — ABNORMAL HIGH (ref 0.00–0.07)
Basophils Absolute: 0 10*3/uL (ref 0.0–0.1)
Basophils Relative: 0 %
EOS PCT: 0 %
Eosinophils Absolute: 0.1 10*3/uL (ref 0.0–0.5)
Immature Granulocytes: 1 %
LYMPHS ABS: 0.8 10*3/uL (ref 0.7–4.0)
Lymphocytes Relative: 5 %
MONOS PCT: 6 %
Monocytes Absolute: 1.1 10*3/uL — ABNORMAL HIGH (ref 0.1–1.0)
Neutro Abs: 14.5 10*3/uL — ABNORMAL HIGH (ref 1.7–7.7)
Neutrophils Relative %: 88 %

## 2018-07-14 LAB — CBC
HCT: 21 % — ABNORMAL LOW (ref 36.0–46.0)
Hemoglobin: 6.7 g/dL — CL (ref 12.0–15.0)
MCH: 30.5 pg (ref 26.0–34.0)
MCHC: 31.9 g/dL (ref 30.0–36.0)
MCV: 95.5 fL (ref 80.0–100.0)
PLATELETS: 229 10*3/uL (ref 150–400)
RBC: 2.2 MIL/uL — ABNORMAL LOW (ref 3.87–5.11)
RDW: 17.4 % — AB (ref 11.5–15.5)
WBC: 16.7 10*3/uL — ABNORMAL HIGH (ref 4.0–10.5)
nRBC: 0 % (ref 0.0–0.2)

## 2018-07-14 LAB — COMPREHENSIVE METABOLIC PANEL
ALT: 10 U/L (ref 0–44)
AST: 13 U/L — AB (ref 15–41)
Albumin: 1.4 g/dL — ABNORMAL LOW (ref 3.5–5.0)
Alkaline Phosphatase: 110 U/L (ref 38–126)
Anion gap: 6 (ref 5–15)
BILIRUBIN TOTAL: 0.4 mg/dL (ref 0.3–1.2)
BUN: 16 mg/dL (ref 8–23)
CALCIUM: 7.8 mg/dL — AB (ref 8.9–10.3)
CO2: 23 mmol/L (ref 22–32)
CREATININE: 0.39 mg/dL — AB (ref 0.44–1.00)
Chloride: 104 mmol/L (ref 98–111)
GFR calc Af Amer: >60 mL/min (ref >60)
Glucose, Bld: 161 mg/dL — ABNORMAL HIGH (ref 70–99)
POTASSIUM: 4.1 mmol/L (ref 3.5–5.1)
Sodium: 133 mmol/L — ABNORMAL LOW (ref 135–145)
TOTAL PROTEIN: 4.6 g/dL — AB (ref 6.5–8.1)

## 2018-07-14 LAB — GLUCOSE, CAPILLARY
GLUCOSE-CAPILLARY: 159 mg/dL — AB (ref 70–99)
GLUCOSE-CAPILLARY: 169 mg/dL — AB (ref 70–99)
GLUCOSE-CAPILLARY: 174 mg/dL — AB (ref 70–99)
GLUCOSE-CAPILLARY: 88 mg/dL (ref 70–99)
Glucose-Capillary: 147 mg/dL — ABNORMAL HIGH (ref 70–99)
Glucose-Capillary: 135 mg/dL — ABNORMAL HIGH (ref 70–99)

## 2018-07-14 LAB — PHOSPHORUS: Phosphorus: 3.3 mg/dL (ref 2.5–4.6)

## 2018-07-14 LAB — PREPARE RBC (CROSSMATCH)

## 2018-07-14 LAB — TRIGLYCERIDES: Triglycerides: 70 mg/dL (ref <150)

## 2018-07-14 LAB — MAGNESIUM: MAGNESIUM: 2 mg/dL (ref 1.7–2.4)

## 2018-07-14 LAB — PREALBUMIN

## 2018-07-14 MED ORDER — BOOST / RESOURCE BREEZE PO LIQD CUSTOM
1.0000 | Freq: Two times a day (BID) | ORAL | Status: DC
  Administered 2018-07-14: 1 via ORAL

## 2018-07-14 MED ORDER — TRAVASOL 10 % IV SOLN
INTRAVENOUS | Status: DC
  Administered 2018-07-14: 20:00:00 via INTRAVENOUS
  Filled 2018-07-14: qty 900

## 2018-07-14 MED ORDER — SACCHAROMYCES BOULARDII 250 MG PO CAPS
250.0000 mg | ORAL_CAPSULE | Freq: Two times a day (BID) | ORAL | Status: DC
  Administered 2018-07-14 – 2018-07-15 (×3): 250 mg via ORAL
  Filled 2018-07-14 (×3): qty 1

## 2018-07-14 MED ORDER — LIDOCAINE 5 % EX PTCH
2.0000 | MEDICATED_PATCH | CUTANEOUS | Status: DC
  Administered 2018-07-15 – 2018-07-16 (×2): 2 via TRANSDERMAL
  Filled 2018-07-14 (×3): qty 2

## 2018-07-14 MED ORDER — ACETAMINOPHEN 325 MG PO TABS
650.0000 mg | ORAL_TABLET | Freq: Four times a day (QID) | ORAL | Status: DC
  Administered 2018-07-14 – 2018-07-15 (×4): 650 mg via ORAL
  Filled 2018-07-14 (×4): qty 2

## 2018-07-14 MED ORDER — METHOCARBAMOL 1000 MG/10ML IJ SOLN
500.0000 mg | Freq: Three times a day (TID) | INTRAVENOUS | Status: DC
  Administered 2018-07-14 – 2018-07-15 (×3): 500 mg via INTRAVENOUS
  Filled 2018-07-14 (×2): qty 500
  Filled 2018-07-14: qty 5
  Filled 2018-07-14: qty 500

## 2018-07-14 MED ORDER — FUROSEMIDE 10 MG/ML IJ SOLN
20.0000 mg | Freq: Two times a day (BID) | INTRAMUSCULAR | Status: DC
  Administered 2018-07-14 – 2018-07-16 (×4): 20 mg via INTRAVENOUS
  Filled 2018-07-14 (×4): qty 2

## 2018-07-14 MED ORDER — FENTANYL 12 MCG/HR TD PT72
12.5000 ug | MEDICATED_PATCH | TRANSDERMAL | Status: DC
  Administered 2018-07-14: 12.5 ug via TRANSDERMAL
  Filled 2018-07-14: qty 1

## 2018-07-14 MED ORDER — ACETAMINOPHEN 500 MG PO TABS
1000.0000 mg | ORAL_TABLET | Freq: Three times a day (TID) | ORAL | Status: DC
  Administered 2018-07-14: 1000 mg via ORAL
  Filled 2018-07-14: qty 2

## 2018-07-14 MED ORDER — OXYCODONE HCL 5 MG/5ML PO SOLN: 5.0000 mg | ORAL | Status: DC | PRN

## 2018-07-14 MED ORDER — MORPHINE SULFATE (PF) 2 MG/ML IV SOLN
2.0000 mg | INTRAVENOUS | Status: DC | PRN
  Administered 2018-07-14: 2 mg via INTRAVENOUS
  Administered 2018-07-14: 4 mg via INTRAVENOUS
  Administered 2018-07-15: 2 mg via INTRAVENOUS
  Administered 2018-07-15: 4 mg via INTRAVENOUS
  Filled 2018-07-14 (×3): qty 2
  Filled 2018-07-14: qty 1

## 2018-07-14 MED ORDER — HYDROCODONE-ACETAMINOPHEN 10-325 MG PO TABS
1.0000 | ORAL_TABLET | Freq: Four times a day (QID) | ORAL | Status: DC
  Administered 2018-07-14 – 2018-07-17 (×12): 1 via ORAL
  Filled 2018-07-14 (×12): qty 1

## 2018-07-14 MED ORDER — ENSURE ENLIVE PO LIQD: 237.0000 mL | ORAL | Status: DC

## 2018-07-14 MED ORDER — FENTANYL 25 MCG/HR TD PT72
25.0000 ug | MEDICATED_PATCH | TRANSDERMAL | Status: DC
  Administered 2018-07-14: 25 ug via TRANSDERMAL
  Filled 2018-07-14: qty 1

## 2018-07-14 MED ORDER — SODIUM CHLORIDE 0.9% IV SOLUTION
Freq: Once | INTRAVENOUS | Status: AC
  Administered 2018-07-14: 10:00:00 via INTRAVENOUS

## 2018-07-14 MED ORDER — ONDANSETRON 4 MG PO TBDP
4.0000 mg | ORAL_TABLET | Freq: Three times a day (TID) | ORAL | Status: DC
  Administered 2018-07-14 – 2018-07-17 (×8): 4 mg via ORAL
  Filled 2018-07-14 (×8): qty 1

## 2018-07-14 NOTE — Progress Notes (Signed)
CRITICAL VALUE ALERT  Critical Value:  hgb 6.7  Date & Time Notied:  0430 07/14/2018   Provider Notified: blount  Orders Received/Actions taken: none at this time

## 2018-07-14 NOTE — Consult Note (Addendum)
[place] Nurse ostomy follow up: Seen this morning with [facility] PA, Will Creig Hines Stoma type/location: RUQ transverse colostomy  Stomal assessment/size: 2 inches, slightly oval at rest. Pink, budded, moist, edematous. Os patent. Peristomal assessment: intact Treatment options for stomal/peristomal skin: skin barrier ring Output: <65mls stool tinged liquid in pouch this morning. Small amount of brown stool pan caked beneath skin barrier of old pouching system. Ostomy pouching: 2pc. 2 and 3/4 inch pouching system with skin barrier ring.  Patterns is cut slightly off-center and medial edge of tape border is trimmed away due to proximity of stoma to midline wound. Education provided: None today. No family in room, patient is not amenable to learning at this time. Enrolled patient in [facility] Start Discharge program: No  WOC nursing team will follow, and will remain available to this patient, the nursing and medical teams.  We will plan for another visit for routine pouching next Monday, 12/2 if not before. Please contact us if a need presents prior to next visit.  Note: There will be no WOC Nursing services available on Thursday, 07/17/18.  Thank you, Maudie Flakes, MSN, RN, Chancy Milroy, Arther Abbott  Pager# [phone]

## 2018-07-14 NOTE — Progress Notes (Addendum)
11 Days Post-Op    OV:FIEPPIRJJ pain is her current complaint  Subjective: She continues to complain of pain and is getting MS for this.  She does have some bowel function but not taking much in.  She has some output thru the ostomy.  Ostomy is fine and we finger dillated the ostomy.  Still some swelling but over all looks good. She is wet between her legs, from what I assume is the drainage, but not like what we saw last week.  No visible brown fluid.  Foley is still in.  Objective: Vital signs in last 24 hours: Temp:  [97.7 F (36.5 C)-100 F (37.8 C)] 100 F (37.8 C) (11/25 0409) Pulse Rate:  [69-102] 82 (11/25 0409) Resp:  [13-30] 22 (11/25 0409) BP: (95-155)/(28-114) 95/63 (11/25 0409) SpO2:  [94 %-99 %] 94 % (11/25 0409) Weight:  [79.7 kg-81.9 kg] 79.7 kg (11/25 0500) Last BM Date: 07/13/18 420 PO 3400 IV 1625 urine Stool - none recorded Afebrile, VSS CMP OK, prealbumin pending WBC is still at 16.7 H/H 6.7/21 Pain:  Tylenol 4 gm, Fentanyl patch 12.5,morphine 14 mg Iv yesterday   Intake/Output from previous day: 11/24 0701 - 11/25 0700 In: 3814.5 [P.O.:420; I.V.:2944.3; IV Piggyback:450.2] Out: 1625 [Urine:1625] Intake/Output this shift: No intake/output data recorded.  General appearance: alert, cooperative, no distress and tired, not really hungry Resp: clear to auscultation bilaterally and anterior Cardio: SR with some PVC's GI: complains of pain, and getting Morphine regularly.  wound OK, a little soupy at the base but no further breakdown from last week, Ostomy OK, some stool. Extremities: right hand very swollen, and some edema lower legs.  Lab Results:  Recent Labs    07/13/18 0737 07/14/18 0355  WBC 16.6* 16.7*  HGB 7.3* 6.7*  HCT 23.2* 21.0*  PLT 210 229    BMET Recent Labs    07/13/18 0445 07/14/18 0355  NA 136 133*  K 3.9 4.1  CL 104 104  CO2 23 23  GLUCOSE 125* 161*  BUN 18 16  CREATININE 0.46 0.39*  CALCIUM 7.3* 7.8*   PT/INR No  results for input(s): LABPROT, INR in the last 72 hours.  Recent Labs  Lab 07/08/18 0603 07/09/18 0539 07/10/18 0534 07/14/18 0355  AST 12* 12* 10* 13*  ALT 12 10 9 10   ALKPHOS 69 70 70 110  BILITOT 0.5 0.6 0.4 0.4  PROT 4.2* 4.0* 4.0* 4.6*  ALBUMIN 1.5* 1.4* 1.3* 1.4*     Lipase     Component Value Date/Time   LIPASE 16 06/13/2018 1754     Medications: . sodium chloride   Intravenous Once  . chlorhexidine  15 mL Mouth Rinse BID  . Chlorhexidine Gluconate Cloth  6 each Topical Daily  . enoxaparin (LOVENOX) injection  40 mg Subcutaneous Q24H  . feeding supplement  1 Container Oral BID BM  . fentaNYL  25 mcg Transdermal Q72H  . insulin aspart  0-15 Units Subcutaneous Q4H  . insulin glargine  5 Units Subcutaneous QHS  . mouth rinse  15 mL Mouth Rinse q12n4p  . metoprolol succinate  150 mg Oral Daily  . protein supplement  8 oz Oral Daily  . sodium chloride flush  10-40 mL Intracatheter Q12H    Assessment/Plan 1.  Perforated sigmoid colon with fecal contamination             Left colectomy, and transverse colostomy, takedown splenic flexure, 07/03/2018 - Rolm Bookbinder  POD#11             Left sided drain removed             More alert today - to advance to full luquids  2. CT for vaginal drainage 11/21             Possible rectovaginal fistula between the LEFT vaginal cuff and the rectum.   3.  Acute hypoxic respiratory failure- extubated 11/15 4.  HXsevereulcerativecolitis              Zosyn - 11/14 >> 5.  Hypertension 6.  History ofacute onchronic anemia -              H/H - 7.6/23.5 - 07/11/2018 7.  History of chronic atrial fibrillation-             on Xarelto at home 8.  Severe Malnutrition - prealbumin 5.2 -07/07/2018             TNA per Medicine             She should get labs tomorrow AM 9.  DVT: Lovenox 10.  DNR 11.  Palliative care to meet with family on Monday, 11/25 12.  Very deconditioned.  FEN: TPN/full liquids =>> D II  diet, Nutrition consult ID: Rocephin/Flagyl 11/6 - 11/8;Zosyn 11/14 =>>day11 DVT: Lovenox Follow up: Dr. Donne Hazel    Plan:  I would like to advance her diet and see if we can get her eating more.  I would like to dc foley, but she is a 2 person assist with hoyer to get her OOB.  Still some moisture between her legs, I assume from fistula, foley is still in.    Meeting today with Palliative.  I am going to ADD PO pain meds, and probiotics.  Will review with Dr. Lucia Gaskins and await family meeting with Palliative.    She is being transfused today also.      LOS: 19 days    JENNINGS,WILLARD 07/14/2018 [phone]  Agree with above. More alert this afternoon.  Need to increase po intake and increase ambulation. Pain meds have been an issue, but at her age, would try to minimize narcotic pain medication. Palliative care to see.  Daughters [name] Plan and Ivin Booty Abrhams in the room.  The patient plans to go hom with [facility] and they think that they have the resources to handle her.  Alphonsa Overall, MD, [facility] Surgery Pager: [phone] Office phone:  [phone]

## 2018-07-14 NOTE — Progress Notes (Signed)
Nutrition Follow-up  DOCUMENTATION CODES:   Not applicable  INTERVENTION:  - Continue Boost Breeze po TID, each supplement provides 250 kcal and 9 grams of protein - Will order Ensure Enlive once/day, this supplement provides 350 kcal and 20 grams of protein. - Will order Magic Cup with lunch meals, this supplement provides 290 kcal and 9 grams of protein. - Will d/c Unjury.  - Continue TPN per Pharmacy and begin to wean as PO intakes improve. - Continue to encourage PO intakes.    NUTRITION DIAGNOSIS:   Inadequate oral intake related to decreased appetite as evidenced by per patient/family report. -ongoing  GOAL:   Patient will meet greater than or equal to 90% of their needs -met with TPN regimen and minimal PO intakes.   MONITOR:   PO intake, Supplement acceptance, Weight trends, Labs, Skin  REASON FOR ASSESSMENT:   Consult (maximizing protein)  ASSESSMENT:   Patient with PMH significant for ulcerative colitis, CKD, HTN, and breast cancer s/p lumpectomy. Recently discharged from [facility] on 11/1 for UC flare and prescribed steroids. Presents this admission with abdominal cramping and constipation.   Weight +4 kg/9 lb from last RD assessment, 11/22. Diet advanced from CLD to [place] on 11/24 at 8:37 AM and from FLD to Dysphagia 2, thin liquids today at 8:32 AM. Per chart review, she consumed 60% of breakfast 11/24 (261 kcal, 9 grams of protein).   Breakfast tray on bedside table and untouched. All items are clears. Patient and RN report that patient has been in pain throughout the morning. RN providing care and states plan to try to have patient eat something later, once pain controlled. No family/visitors at bedside at this time. Per review of orders, patient accepted 1 of 3 packets of Unjury chicken soup and 4 of 6 cartons of Boost Breeze.   Patient with triple lumen PICC and is receiving custom TPN (10% amino acids, 17% dextrose, 30% ILE) @ 75 mL/hr which is providing 1850 kcal  and 90 grams of protein. TPN meeting 100% estimated nutrition needs.    Medications reviewed; 20 mg IV Lasix BID, sliding scale Novolog, 5 units Lantus/day, 250 mg Florastor BID. Labs reviewed; CBGs: 147 and 135 mg/dL today, Na: 133 mmol/L, creatinine: 0.39 mg/dL, Ca: 7.8 mg/dL.      Diet Order:   Diet Order            DIET DYS 2 Room service appropriate? Yes; Fluid consistency: Thin  Diet effective now              EDUCATION NEEDS:   Not appropriate for education at this time  Skin:  Skin Assessment: Skin Integrity Issues: Skin Integrity Issues:: Incisions Incisions: abdominal (11/14)  Last BM:  11/24  Height:   Ht Readings from Last 1 Encounters:  07/03/18 _0  (1.549 m)    Weight:   Wt Readings from Last 1 Encounters:  07/14/18 79.7 kg    Ideal Body Weight:  47.7 kg  BMI:  Body mass index is 33.2 kg/m.  Estimated Nutritional Needs:   Kcal:  1650-1850  Protein:  80-90 grams  Fluid:  >/= 1.7 L/day     Jarome Matin, MS, RD, LDN, [facility] Inpatient Clinical Dietitian Pager # [phone] After hours/weekend pager # [phone]

## 2018-07-14 NOTE — Progress Notes (Signed)
PT Cancellation Note  Patient Details Name: Felicia Carlson MRN: [id number] DOB: Dec 20, 1924   Cancelled Treatment:    Reason Eval/Treat Not Completed: Medical issues which prohibited therapy(resting RR 22, Hgb 6.7)   [facility] 07/14/2018, 9:21 AM

## 2018-07-14 NOTE — Care Management Note (Signed)
Case Management Note  Patient Details  Name: Felicia Carlson MRN: [id number] Date of Birth: 1925-02-21  Subjective/Objective:                  1. Perforated sigmoid colon with fecal contamination Left colectomy, and transverse colostomy, takedown splenic flexure, 07/03/2018 -Rolm Bookbinder             POD#11 Left sided drain removed More alert today - to advance to full luquids  2. CT for vaginal drainage 11/21 Possible rectovaginal fistula between the LEFT vaginal cuff and the rectum.   3. Acute hypoxic respiratory failure- extubated 11/15 4. HXsevereulcerativecolitis  Zosyn - 11/14 >> 5. Hypertension 6. History ofacute onchronic anemia -  H/H - 7.6/23.5 - 07/11/2018 7. History of chronic atrial fibrillation- on Xarelto at home 8. Severe Malnutrition - prealbumin 5.2 -07/07/2018 TNA per Medicine She should get labs tomorrow AM 9. DVT: Lovenox 10. DNR 11. Palliative care to meet with family on Monday, 11/25 12. Very deconditioned.  Action/Plan: Will follow for progression of care and clinical status. Will follow for case management needs none present at this time.  Expected Discharge Date:                  Expected Discharge Plan:  [place]  In-House Referral:     Discharge planning Services  CM Consult  Post Acute Care Choice:  Resumption of Svcs/PTA Provider Choice offered to:     DME Arranged:    DME Agency:     HH Arranged:  OT, PT [place] Agency:  [place]  Status of Service:  In process, will continue to follow  If discussed at Long Length of Stay Meetings, dates discussed:    Additional Comments:  Leeroy Cha, RN 07/14/2018, 8:49 AM

## 2018-07-14 NOTE — Progress Notes (Signed)
PHARMACY - ADULT TOTAL PARENTERAL NUTRITION CONSULT NOTE   Pharmacy Consult for TPN Indication: s/p sigmoid colon perforation repair, postop ileus  Patient Measurements: Height: _0  (154.9 cm) Weight: 175 lb 11.3 oz (79.7 kg) IBW/kg (Calculated) : 47.8   Body mass index is 33.2 kg/m.  Insulin Requirements: 12 units of moderate SSI  Lantus 5 units daily added on 11/21 by TRH  Current Nutrition: Advanced to dysphagia diet (11/25) - boost breeze (2 doses on 11/23, 2 doses on 11/24), Unjury chicken soup (given 11/23, not given 11/24) - Unjury d/c, start Ensure Enlive daily and Magic cup daily 11/25 - Per MD report, she is not taking in much PO   IVF: NS at 50 ml/hr  Central access: PICC TPN start date: 11/17  ASSESSMENT                                                                                                          HPI: Patient with PMH significant for ulcerative colitis, chronic afib on xarelto (not taking PTA), CKD, HTN, and breast cancer. Recently discharged from [facility] on 06/20/18 for UC flare and prescribed steroids. Presents this admission on 07/02/18 with abdominal cramping and constipation. CT abdomen showed large amount of free air.  Significant events:  11/14: colostomy and colectomy 11/17: start TPN 11/18 NG tube removed by patient.  No nausea, but essentially no ostomy output, no flatus. 11/20 Patient is SD-ICU status, start lipids in TPN today. 11/21 RN reports stool appearing fluid leaking from vaginal area.  CT shows possible rectovaginal fistula.  No surgical intervention planned.  Today:   Glucose:  No hx DM, A1c 6.6 05/2018. CBG range 81-178, all but two are < 150  MD added low dose Lantus 5 units daily on 11/21  Electrolytes: Na 133 low (decreased), WNL including corrected Ca  Renal: SCr WNL  I/O +2.2 L over past 24 hrs (UOP 0.8 ml/kg/hr); with I/O +11 L this admission  Lasix Q12h added 11/25  LFTs: AST low, ALT and Alk Phos WNL, Tbili WNL,  albumin low at 1.4 (11/25)  TGs: 79 (11/18), 70 (11/25)  Prealbumin: 5.2 (11/18), pending (11/25)  Hgb decreased to 6.7, transfuse 2 units PRBC, lasix added  NUTRITIONAL GOALS                                                                                             RD recs: (11/22): 1650-1850 kcal, 80-90g protein per day  Custom TPN at goal rate of 75 ml/hr provides: 90 g/day protein (50 g/L) 45 g/day Lipid  (25 g/L) 306 g/day Dextrose (17 %) 1850 Kcal/day   PLAN  At 1800 today:  Continue TPN at goal rate of 75 ml/hr. -  Provides 90 g of protein, 306 g of dextrose, 45g Lipids which provides 1850 kCals per day, meeting 100 % of goal kcal and AA - F/u tolerance/amount of enteral nutrition  Electrolytes in TPN: Further increase Na 75 meq/L. Continue increased K+ 60 meq/L and Phos to 25 mmol/L, Cl:Ac ratio 1:1  TPN to contain standard multivitamins and trace elements.  Continue Famotidine 10m in TPN.  IVF per MD (NS d/c on 11/25 AM)  Continue moderate SSI q4h.  Continue Lantus 5 units daily per MD.  TPN lab panels on Mondays & Thursdays.  BMET daily with AM labs.  F/u advancement of diet and ability to wean TPN.    F/u daily.  CGretta ArabPharmD, BCPS Pager [phone]/25/2019 7:52 AM

## 2018-07-14 NOTE — Progress Notes (Addendum)
[facility] PROGRESS NOTE    Progress Note  [name]  ZWC:[id number] DOB: Dec 17, 1924 DOA: 06/25/2018 PCP: Lajean Manes, MD     Brief Narrative:   Felicia Carlson is an 84 y.o. female past medical history significant of ulcerative colitis, chronic atrial fibrillation on Xarelto, recently discharged on 08/20/2017 for ulcerative colitis flares placed on steroids a flex sig during that time showed left-sided ulcerative colitis presented on 06/25/2018 for bright red blood per rectum and abdominal pain for a week, on 07/02/2018 patient had worsening abdominal pain a CT scan of the abdomen and pelvis showed free air, initially it showed no extravasation of oral contrast and the patient was nontoxic-appearing per chart, during this time the patient was felt not to require emergent surgery, initially also the patient was hesitant about proceeding with surgery however she deteriorated on 07/03/2018 at which time surgery recommended emergent exploratory laparotomy and when colectomy with colostomy on 07/03/2018  Assessment/Plan:   Pneumoperitoneum perforation of sigmoid: Status post left colectomy, colostomy on 07/03/2018: Likely due to ulcerative colitis. - Currently on TPN, IV Zosyn for presumed intra-abdominal infection, antibiotic management per surgery. - Continue narcotics for pain. - CT scan of the abdomen and pelvis on 07/10/2018: Show possible rectovaginal fistula, no pelvic abscess or intraperitoneal air, she does have choledocholithiasis - She has a mild drop in hemoglobin to 6.7, will transfuse 2 units of packed red blood cells to do CBC post transfusional. -Requiring significant amount of narcotics will increase her fentanyl patch. - Discussed with surgeon-we will start Tylenol orally scheduled.  Acute hypoxic respiratory failure with hypoxia: - Intubated on 07/03/2018, status post extubation on 07/04/2018. - Currently on nasal oxygen saturating above 90%. - Continue incentive  spirometry.  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 - We have DC'd steroids as per surgery recommendation. - Further management per surgery.  Essential hypertension: Blood pressure better controlled.  Chronic atrial fibrillation: Chads VASC 5, holding Xarelto. Cont. to hold Xarelto, Lovenox subcutaneously. She is sinus rhythm rate controlled.  Change her metoprolol to oral.  Acute blood loss anemia/ Acute on chronic anemia Hemoglobin has been stable ranging from 8.5-8.1. Status post 1 unit of packed red blood cells on 07/06/2018 Hemoglobin dropped again today on 07/14/2018 we will transfuse 2 units of packed red blood cells.  Hyperglycemia: Likely due to steroids, she is n.p.o. and TPN.  Hematuria: Improving.  Probably traumatic.  Third spacing: We will have to be judicious with IV fluids, she has bilateral pleural effusion and body wall edema  RN Pressure Injury Documentation:    Estimated body mass index is 33.2 kg/m as calculated from the following:   Height as of this encounter: 5\' 1"  (1.549 m).   Weight as of this encounter: 79.7 kg. Malnutrition Type:  Nutrition Problem: Inadequate oral intake Etiology: decreased appetite   Malnutrition Characteristics:  Signs/Symptoms: per patient/family report   Nutrition Interventions:  Interventions: Refer to RD note for recommendations    DVT prophylaxis: lovenox Family Communication:none Disposition Plan/Barrier to D/C: unable to determine Code Status:     Code Status Orders  (From admission, onward)         Start     Ordered   07/05/18 1038  Do not attempt resuscitation (DNR)  Continuous    Question Answer Comment  In the event of cardiac or respiratory ARREST Do not call a "code blue"   In the event of  cardiac or respiratory ARREST Do not perform Intubation, CPR, defibrillation or ACLS   In the event  of cardiac or respiratory ARREST Use medication by any route, position, wound care, and other measures to relive pain and suffering. May use oxygen, suction and manual treatment of airway obstruction as needed for comfort.      07/05/18 1038        Code Status History    Date Active Date Inactive Code Status Order ID Comments User Context   07/03/2018 2012 07/05/2018 1038 Full Code [id number]  Rolm Bookbinder, MD Inpatient   06/25/2018 1825 07/03/2018 2012 DNR [id number]  [name], MD Inpatient   06/25/2018 1825 06/25/2018 1825 Full Code [id number]  [name], MD Inpatient   06/13/2018 2323 06/20/2018 1619 DNR [id number]  [name], MD Inpatient   10/26/2014 1912 10/30/2014 1814 Full Code [id number]  [name], MD Inpatient    Advance Directive Documentation     Most Recent Value  Type of Advance Directive  Healthcare Power of Attorney  Pre-existing out of facility DNR order (yellow form or pink MOST form)  -  "MOST" Form in Place?  -        IV Access:    Peripheral IV   Procedures and diagnostic studies:   No results found.   Medical Consultants:    None.  Anti-Infectives:   Zosyn  Subjective:    Felicia Carlson, pain improved overnight but she required several doses of IV morphine. Objective:    Vitals:   07/14/18 0000 07/14/18 0008 07/14/18 0409 07/14/18 0500  BP: 116/87  95/63   Pulse: 88  82   Resp: (!) 23  (!) 22   Temp:  97.7 F (36.5 C) 100 F (37.8 C)   TempSrc:  Axillary Oral   SpO2: 97%  94%   Weight:    79.7 kg  Height:        Intake/Output Summary (Last 24 hours) at 07/14/2018 0717 Last data filed at 07/14/2018 0626 Gross per 24 hour  Intake 3814.45 ml  Output 1625 ml  Net 2189.45 ml   Filed Weights   07/11/18 1114 07/13/18 0829 07/14/18 0500  Weight: 75.7 kg 81.9 kg 79.7 kg    Exam: General exam: In no acute distress. Respiratory system: Good air movement and clear to  auscultation. Cardiovascular system: S1 & S2 heard, RRR. Gastrointestinal system: Abdomen is nondistended, soft and ostomy in place. Central nervous system: Alert and oriented. No focal neurological deficits. Extremities: No pedal edema. Skin: No rashes, lesions or ulcers Psychiatry: Judgement and insight appear normal. Mood & affect appropriate.    Data Reviewed:    Labs: Basic Metabolic Panel: Recent Labs  Lab 07/08/18 0603 07/09/18 0539 07/10/18 0534 07/11/18 0918 07/12/18 0451 07/13/18 0445 07/14/18 0355  NA 135 130* 131* 134* 136 136 133*  K 4.0 4.0 4.3 4.4 4.2 3.9 4.1  CL 103 100 99 104 107 104 104  CO2 26 27 26 25 24 23 23   GLUCOSE 171* 237* 274* 190* 106* 125* 161*  BUN 11 14 20 20 21 18 16   CREATININE 0.44 0.46 0.41* 0.54 0.43* 0.46 0.39*  CALCIUM 7.4* 7.3* 7.3* 7.5* 7.7* 7.3* 7.8*  MG 1.9 2.0 2.1  --   --  1.9 2.0  PHOS 2.1* 2.7 3.1  --   --  2.6 3.3   GFR Estimated Creatinine Clearance: 44.7 mL/min (A) (by C-G formula based on SCr of 0.39 mg/dL (L)). Liver Function Tests: Recent  Labs  Lab 07/08/18 0603 07/09/18 0539 07/10/18 0534 07/14/18 0355  AST 12* 12* 10* 13*  ALT 12 10 9 10   ALKPHOS 69 70 70 110  BILITOT 0.5 0.6 0.4 0.4  PROT 4.2* 4.0* 4.0* 4.6*  ALBUMIN 1.5* 1.4* 1.3* 1.4*   No results for input(s): LIPASE, AMYLASE in the last 168 hours. No results for input(s): AMMONIA in the last 168 hours. Coagulation profile No results for input(s): INR, PROTIME in the last 168 hours.  CBC: Recent Labs  Lab 07/08/18 0603 07/10/18 0534 07/11/18 0918 07/13/18 0737 07/14/18 0355  WBC 12.1* 12.6* 13.1* 16.6* 16.7*  NEUTROABS  --   --   --   --  14.5*  HGB 8.1* 7.9* 7.6* 7.3* 6.7*  HCT 25.4* 24.7* 23.5* 23.2* 21.0*  MCV 91.7 93.6 95.1 95.1 95.5  PLT 183 181 182 210 229   Cardiac Enzymes: No results for input(s): CKTOTAL, CKMB, CKMBINDEX, TROPONINI in the last 168 hours. BNP (last 3 results) No results for input(s): PROBNP in the last 8760  hours. CBG: Recent Labs  Lab 07/13/18 1223 07/13/18 1536 07/13/18 1941 07/13/18 2327 07/14/18 0405  GLUCAP 157* 178* 149* 124* 147*   D-Dimer: No results for input(s): DDIMER in the last 72 hours. Hgb A1c: No results for input(s): HGBA1C in the last 72 hours. Lipid Profile: Recent Labs    07/14/18 0355  TRIG 70   Thyroid function studies: No results for input(s): TSH, T4TOTAL, T3FREE, THYROIDAB in the last 72 hours.  Invalid input(s): FREET3 Anemia work up: No results for input(s): VITAMINB12, FOLATE, FERRITIN, TIBC, IRON, RETICCTPCT in the last 72 hours. Sepsis Labs: Recent Labs  Lab 07/10/18 0534 07/11/18 0918 07/13/18 0737 07/14/18 0355  WBC 12.6* 13.1* 16.6* 16.7*   Microbiology No results found for this or any previous visit (from the past 240 hour(s)).   Medications:   . sodium chloride   Intravenous Once  . chlorhexidine  15 mL Mouth Rinse BID  . Chlorhexidine Gluconate Cloth  6 each Topical Daily  . enoxaparin (LOVENOX) injection  40 mg Subcutaneous Q24H  . feeding supplement  1 Container Oral BID BM  . [START ON 07/15/2018] fentaNYL  12.5 mcg Transdermal Q72H  . insulin aspart  0-15 Units Subcutaneous Q4H  . insulin glargine  5 Units Subcutaneous QHS  . mouth rinse  15 mL Mouth Rinse q12n4p  . metoprolol succinate  150 mg Oral Daily  . protein supplement  8 oz Oral Daily  . sodium chloride flush  10-40 mL Intracatheter Q12H   Continuous Infusions: . sodium chloride Stopped (07/13/18 1718)  . sodium chloride 50 mL/hr at 07/14/18 0225  . piperacillin-tazobactam (ZOSYN)  IV Stopped (07/14/18 0626)  . TPN ADULT (ION) 75 mL/hr at 07/13/18 1732      LOS: 19 days   [name]  [facility]   *Please refer to [email or web address], password [email or web address] to get updated schedule on who will round on this patient, as hospitalists switch teams weekly. If 7PM-7AM, please contact night-coverage at [email or web address], password [email or web address] for any overnight  needs.  07/14/2018, 7:17 AM

## 2018-07-14 NOTE — Progress Notes (Signed)
[facility] PROGRESS NOTE    Progress Note  Felicia Carlson  YNW:[id number] DOB: 13-Jan-1925 DOA: 06/25/2018 PCP: Lajean Manes, MD     Brief Narrative:   Felicia Carlson is an 84 y.o. female past medical history significant of ulcerative colitis, chronic atrial fibrillation on Xarelto, recently discharged on 08/20/2017 for ulcerative colitis flares placed on steroids a flex sig during that time showed left-sided ulcerative colitis presented on 06/25/2018 for bright red blood per rectum and abdominal pain for a week, on 07/02/2018 patient had worsening abdominal pain a CT scan of the abdomen and pelvis showed free air, initially it showed no extravasation of oral contrast and the patient was nontoxic-appearing per chart, during this time the patient was felt not to require emergent surgery, initially also the patient was hesitant about proceeding with surgery however she deteriorated on 07/03/2018 at which time surgery recommended emergent exploratory laparotomy and when colectomy with colostomy on 07/03/2018  Assessment/Plan:   Pneumoperitoneum perforation of sigmoid: Status post left colectomy, colostomy on 07/03/2018: Likely due to ulcerative colitis. - Currently on TPN, IV Zosyn for presumed intra-abdominal infection, antibiotic management per surgery. - Continue narcotics for pain. - CT scan of the abdomen and pelvis on 07/10/2018: Show possible rectovaginal fistula, no pelvic abscess or intraperitoneal air, she does have choledocholithiasis - She has a mild drop in hemoglobin to 6.7 on 07/14/2018, will transfuse 2 units of packed red blood cells to do CBC post transfusional. -Requiring significant amount of narcotics will increase her fentanyl patch. - Discussed with surgeon-we will start Tylenol orally scheduled.  Acute hypoxic respiratory failure with hypoxia: - Intubated on 07/03/2018, status post extubation on 07/04/2018. - Currently on nasal oxygen saturating above 90%. - Continue  incentive spirometry.  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 - We have DC'd steroids as per surgery recommendation. - Further management per surgery.  Essential hypertension: Blood pressure better controlled.  Chronic atrial fibrillation: Chads VASC 5, holding Xarelto.  Risk and benefits were discussed with family and they understand the risk. Cont. to hold Xarelto, Lovenox subcutaneously. She is sinus rhythm rate controlled.  Change her metoprolol to oral.  Acute blood loss anemia/ Acute on chronic anemia Hemoglobin has been stable ranging from 8.5-8.1. Status post 1 unit of packed red blood cells on 07/06/2018 Hemoglobin dropped again today on 07/14/2018 we will transfuse 2 units of packed red blood cells.  Hyperglycemia: Likely due to steroids, now on clears and TPN.  Hematuria: Improving.  Probably traumatic.  Third spacing: We will start low-dose Lasix twice a day, she has bilateral pleural effusion and body wall edema  RN Pressure Injury Documentation:    Estimated body mass index is 33.2 kg/m as calculated from the following:   Height as of this encounter: 5\' 1"  (1.549 m).   Weight as of this encounter: 79.7 kg. Malnutrition Type:  Nutrition Problem: Inadequate oral intake Etiology: decreased appetite   Malnutrition Characteristics:  Signs/Symptoms: per patient/family report   Nutrition Interventions:  Interventions: Refer to RD note for recommendations    DVT prophylaxis: lovenox Family Communication:none Disposition Plan/Barrier to D/C: unable to determine Code Status:     Code Status Orders  (From admission, onward)         Start     Ordered   07/05/18 1038  Do not attempt resuscitation (DNR)  Continuous    Question Answer Comment  In the event of cardiac or  respiratory ARREST Do not call a "code blue"   In the event of cardiac or  respiratory ARREST Do not perform Intubation, CPR, defibrillation or ACLS   In the event of cardiac or respiratory ARREST Use medication by any route, position, wound care, and other measures to relive pain and suffering. May use oxygen, suction and manual treatment of airway obstruction as needed for comfort.      07/05/18 1038        Code Status History    Date Active Date Inactive Code Status Order ID Comments User Context   07/03/2018 2012 07/05/2018 1038 Full Code [id number]  Rolm Bookbinder, MD Inpatient   06/25/2018 1825 07/03/2018 2012 DNR [id number]  [name], MD Inpatient   06/25/2018 1825 06/25/2018 1825 Full Code [id number]  [name], MD Inpatient   06/13/2018 2323 06/20/2018 1619 DNR [id number]  [name], MD Inpatient   10/26/2014 1912 10/30/2014 1814 Full Code [id number]  [name], MD Inpatient    Advance Directive Documentation     Most Recent Value  Type of Advance Directive  Healthcare Power of Attorney  Pre-existing out of facility DNR order (yellow form or pink MOST form)  -  "MOST" Form in Place?  -        IV Access:    Peripheral IV   Procedures and diagnostic studies:   No results found.   Medical Consultants:    None.  Anti-Infectives:   Zosyn  Subjective:    [name], pain improved overnight but she required several doses of IV morphine. Objective:    Vitals:   07/14/18 0409 07/14/18 0500 07/14/18 0800 07/14/18 0948  BP: 95/63   (!) 185/88  Pulse: 82   (!) 59  Resp: (!) 22   19  Temp: 100 F (37.8 C)  98.6 F (37 C) 98.7 F (37.1 C)  TempSrc: Oral  Oral Oral  SpO2: 94%   94%  Weight:  79.7 kg    Height:        Intake/Output Summary (Last 24 hours) at 07/14/2018 1003 Last data filed at 07/14/2018 0626 Gross per 24 hour  Intake 2924.27 ml  Output 1400 ml  Net 1524.27 ml   Filed Weights   07/11/18 1114 07/13/18 0829 07/14/18 0500  Weight: 75.7 kg 81.9 kg 79.7 kg     Exam: General exam: In no acute distress, she has anasarca. Respiratory system: Good air movement and clear to auscultation. Cardiovascular system: S1 & S2 heard, RRR. Gastrointestinal system: Abdomen is nondistended, soft and ostomy in place. Central nervous system: Alert and oriented. No focal neurological deficits. Extremities: 3+ edema Skin: No rashes, lesions or ulcers Psychiatry: Judgement and insight appear normal. Mood & affect appropriate.    Data Reviewed:    Labs: Basic Metabolic Panel: Recent Labs  Lab 07/08/18 0603 07/09/18 0539 07/10/18 0534 07/11/18 0918 07/12/18 0451 07/13/18 0445 07/14/18 0355  NA 135 130* 131* 134* 136 136 133*  K 4.0 4.0 4.3 4.4 4.2 3.9 4.1  CL 103 100 99 104 107 104 104  CO2 26 27 26 25 24 23 23   GLUCOSE 171* 237* 274* 190* 106* 125* 161*  BUN 11 14 20 20 21 18 16   CREATININE 0.44 0.46 0.41* 0.54 0.43* 0.46 0.39*  CALCIUM 7.4* 7.3* 7.3* 7.5* 7.7* 7.3* 7.8*  MG 1.9 2.0 2.1  --   --  1.9 2.0  PHOS 2.1* 2.7 3.1  --   --  2.6 3.3   GFR  Estimated Creatinine Clearance: 44.7 mL/min (A) (by C-G formula based on SCr of 0.39 mg/dL (L)). Liver Function Tests: Recent Labs  Lab 07/08/18 0603 07/09/18 0539 07/10/18 0534 07/14/18 0355  AST 12* 12* 10* 13*  ALT 12 10 9 10   ALKPHOS 69 70 70 110  BILITOT 0.5 0.6 0.4 0.4  PROT 4.2* 4.0* 4.0* 4.6*  ALBUMIN 1.5* 1.4* 1.3* 1.4*   No results for input(s): LIPASE, AMYLASE in the last 168 hours. No results for input(s): AMMONIA in the last 168 hours. Coagulation profile No results for input(s): INR, PROTIME in the last 168 hours.  CBC: Recent Labs  Lab 07/08/18 0603 07/10/18 0534 07/11/18 0918 07/13/18 0737 07/14/18 0355  WBC 12.1* 12.6* 13.1* 16.6* 16.7*  NEUTROABS  --   --   --   --  14.5*  HGB 8.1* 7.9* 7.6* 7.3* 6.7*  HCT 25.4* 24.7* 23.5* 23.2* 21.0*  MCV 91.7 93.6 95.1 95.1 95.5  PLT 183 181 182 210 229   Cardiac Enzymes: No results for input(s): CKTOTAL, CKMB,  CKMBINDEX, TROPONINI in the last 168 hours. BNP (last 3 results) No results for input(s): PROBNP in the last 8760 hours. CBG: Recent Labs  Lab 07/13/18 1536 07/13/18 1941 07/13/18 2327 07/14/18 0405 07/14/18 0752  GLUCAP 178* 149* 124* 147* 135*   D-Dimer: No results for input(s): DDIMER in the last 72 hours. Hgb A1c: No results for input(s): HGBA1C in the last 72 hours. Lipid Profile: Recent Labs    07/14/18 0355  TRIG 70   Thyroid function studies: No results for input(s): TSH, T4TOTAL, T3FREE, THYROIDAB in the last 72 hours.  Invalid input(s): FREET3 Anemia work up: No results for input(s): VITAMINB12, FOLATE, FERRITIN, TIBC, IRON, RETICCTPCT in the last 72 hours. Sepsis Labs: Recent Labs  Lab 07/10/18 0534 07/11/18 0918 07/13/18 0737 07/14/18 0355  WBC 12.6* 13.1* 16.6* 16.7*   Microbiology No results found for this or any previous visit (from the past 240 hour(s)).   Medications:   . acetaminophen  1,000 mg Oral Q8H  . chlorhexidine  15 mL Mouth Rinse BID  . Chlorhexidine Gluconate Cloth  6 each Topical Daily  . feeding supplement  1 Container Oral BID BM  . fentaNYL  25 mcg Transdermal Q72H  . insulin aspart  0-15 Units Subcutaneous Q4H  . insulin glargine  5 Units Subcutaneous QHS  . mouth rinse  15 mL Mouth Rinse q12n4p  . metoprolol succinate  150 mg Oral Daily  . protein supplement  8 oz Oral Daily  . saccharomyces boulardii  250 mg Oral BID  . sodium chloride flush  10-40 mL Intracatheter Q12H   Continuous Infusions: . sodium chloride Stopped (07/13/18 1718)  . piperacillin-tazobactam (ZOSYN)  IV Stopped (07/14/18 0626)  . TPN ADULT (ION) 75 mL/hr at 07/13/18 1732      LOS: 19 days   [name]  [facility]   *Please refer to [email or web address], password [email or web address] to get updated schedule on who will round on this patient, as hospitalists switch teams weekly. If 7PM-7AM, please contact night-coverage at [email or web address], password [email or web address]  for any overnight needs.  07/14/2018, 10:03 AM

## 2018-07-15 DIAGNOSIS — Z515 Encounter for palliative care: Secondary | ICD-10-CM

## 2018-07-15 LAB — HEMOGLOBIN AND HEMATOCRIT, BLOOD
HEMATOCRIT: 28.2 % — AB (ref 36.0–46.0)
HEMOGLOBIN: 9.4 g/dL — AB (ref 12.0–15.0)

## 2018-07-15 LAB — BPAM RBC
BLOOD PRODUCT EXPIRATION DATE: 201912202359
Blood Product Expiration Date: 201912202359
ISSUE DATE / TIME: 201911251329
ISSUE DATE / TIME: 201911250936
UNIT TYPE AND RH: 5100
Unit Type and Rh: 5100

## 2018-07-15 LAB — TYPE AND SCREEN
ABO/RH(D): O POS
Antibody Screen: NEGATIVE
Unit division: 0
Unit division: 0

## 2018-07-15 LAB — GLUCOSE, CAPILLARY
GLUCOSE-CAPILLARY: 145 mg/dL — AB (ref 70–99)
GLUCOSE-CAPILLARY: 108 mg/dL — AB (ref 70–99)
GLUCOSE-CAPILLARY: 161 mg/dL — AB (ref 70–99)
Glucose-Capillary: 71 mg/dL (ref 70–99)

## 2018-07-15 LAB — BASIC METABOLIC PANEL
Anion gap: 6 (ref 5–15)
BUN: 18 mg/dL (ref 8–23)
CO2: 25 mmol/L (ref 22–32)
Calcium: 7.8 mg/dL — ABNORMAL LOW (ref 8.9–10.3)
Chloride: 105 mmol/L (ref 98–111)
Creatinine, Ser: 0.41 mg/dL — ABNORMAL LOW (ref 0.44–1.00)
GFR calc Af Amer: >60 mL/min (ref >60)
GFR calc non Af Amer: >60 mL/min (ref >60)
GLUCOSE: 104 mg/dL — AB (ref 70–99)
POTASSIUM: 4 mmol/L (ref 3.5–5.1)
Sodium: 136 mmol/L (ref 135–145)

## 2018-07-15 MED ORDER — MORPHINE SULFATE (CONCENTRATE) 10 MG/0.5ML PO SOLN
20.0000 mg | ORAL | Status: DC | PRN
  Administered 2018-07-16 – 2018-07-17 (×5): 20 mg via ORAL
  Filled 2018-07-15 (×5): qty 1

## 2018-07-15 MED ORDER — TRAVASOL 10 % IV SOLN
INTRAVENOUS | Status: DC
  Filled 2018-07-15: qty 900

## 2018-07-15 MED ORDER — METHOCARBAMOL 1000 MG/10ML IJ SOLN
500.0000 mg | Freq: Two times a day (BID) | INTRAVENOUS | Status: DC
  Administered 2018-07-15 – 2018-07-16 (×2): 500 mg via INTRAVENOUS
  Filled 2018-07-15: qty 500
  Filled 2018-07-15: qty 5
  Filled 2018-07-15: qty 500
  Filled 2018-07-15: qty 5

## 2018-07-15 MED ORDER — MORPHINE SULFATE (PF) 4 MG/ML IV SOLN
4.0000 mg | INTRAVENOUS | Status: DC | PRN
  Administered 2018-07-15 – 2018-07-16 (×3): 4 mg via INTRAVENOUS
  Filled 2018-07-15 (×3): qty 1

## 2018-07-15 NOTE — Progress Notes (Signed)
Palliative Care Progress Note  Mrs. Gorgas remains in the ICU, she is still on TPN, she is getting a unit of blood. She is struggling with pain-mostly when she moves. Two of her daughters are at bedside and they tell me that she has struggled with severe low back pain for many years and sciatica. I tried to sort out by exam and by questioning if her pain is predominantly coming from her back or from her abdomen and incisional pain-I think both are contributing but I observed her getting out the bed today into the chair and it was excruciating for her-she was yelling out in pain and appeared to be extremely distressed and agitated from her condition. She says the pain goes down both of her legs like electrical shocks- which is very different description than visceral pain that she would be having from her abdomen.  She has a bulky dressing on her abdomen and around the ostomy and does have tenderness on palpation. She has quite a bit of pre-sacral edema and it was difficult to localize any back tenderness but she cannot bear weight mostly because of pain that she says is in her back. She has remained mentally A&O with some mild episodes of delirium and agitation that are short lived. Her daughters are very concerned about the amount of opioid she is receiving and felt like over the weekend she was over medicated.She reports new onset nausea-I suspect this is from getting PO oxycodone and also her intra-abdominal inflammation.  Goals of Care Issues Discussed:  1. Pain and Symptom Management: We discussed her symptom management and the difficult task of encouraging mobility which will be the single most important aspect of whether she can recover at least some of her functional status- I expressed my concern and worry that for a 84 year old a serious illness like this is extremely difficult to overcome and get back to her prior state of health.The patient and her daughters are extremely realistic-they are hoping  for the best but are also being cautious and know this will be a difficult road ahead. IN terms of her medications- by hx and to my knowledge she does not have a history of chronic opioid use-she was placed on a fentanyl patch on Friday 12.5 and it appears this was increased to 25 and it does appear over the past 24 hours she has required very frequent prn morphine. Her family is concerned about the opioid side effects so we discussed leaving the duragesic at 12.5 and adding on scheduled hydrocodone since she is taking PO now and try to get her down to just the hydrocodone eventually-.her acute incisional pain should be improving. Will give with scheduled zofran (I suspect this isn't the best agent for her type of nausea but the other options are likely to cause sedation and other side effects). Also ordered lidoderm patches and k-pad therapy. Added IV robaxin q8 for the next 24 hours to see if that helps. Will continue tylenol at maximum daily dose.  Given the level of pain I saw today with her moving from bed to chair I believe this could be related to her back- I am concerned for compression fracture or significant nerve involvement from progressive degenerative disease dx. It would be reasonable when she is more stable to get some imaging to rule out and acute process in her lumbar and thoracic spine.  We could potentially consider opioid sparing intervention such as interventional pain management to see if that could provide  at least some relief.  2. We discussed PO intake- it is improving no vomiting tolerating clears- can hopefully stop TPN and advance her diet - She has gallstones and sludge on her last CT so TPN may cause her additional issues.  3. We dicussed ICU level care -which they say has been exceptional but the environment can be difficult for undisturbed sleep and rest leading to other problems especially in those with advanced age.  4. Anticipating discharge goals and needs-provided Mrs.  Bur does not decline in the hospital her family insists on taking her home to care for her, no SNF- we probably need to begin planning for this transition and making sure that we have as much support as we can get them. She would be a great candidate for [facility] program- will ask [facility] to explore this option.   5. Patient has DNR order in place.  Will continue to follow daily and adjust pain regimen based on her needs and usage of PRNs.  Lane Hacker, DO Palliative Medicine  Time: 70 minutes at bedside discussing plan of care and shared decision making with patient and her family.Greater than 50%  of this time was spent counseling and coordinating care related to the above assessment and plan.

## 2018-07-15 NOTE — Progress Notes (Signed)
Occupational Therapy Treatment Patient Details Name: Felicia Carlson MRN: [id number] DOB: 18-Dec-1924 Today's Date: 07/15/2018    History of present illness 84 yo female admitted with ulcerative colitis,  transferred to the ICU after undergoing left colectomy; end-transverse colostomy; and takedown of splenic flexure on 07/03/18, Extubated 11/15; PMHx: chest pain, abnormal ECG. hx of breast ca, UC, CKD   OT comments  Pt VERY sleepy. Daughter open to family education regarding BUE positioning as well as PROM and AAROM BUE           Precautions / Restrictions Precautions Precautions: Fall Precaution Comments: JP drain, multiple lines, ostomy              ADL either performed or assessed with clinical judgement   ADL                                         General ADL Comments: Pt in bed and very sleepy. Daughter present and OT educated her on [place] as well as PROM/AAROM of BUE. Pts daughter did well with PROM.AAROM as well as positioning.  Pts arms with significant edema.  Educated on importance of elevation.  OT session focused on family ed due to pt so sleepy.     Vision Patient Visual Report: No change from baseline            Cognition Arousal/Alertness: Lethargic;Suspect due to medications Behavior During Therapy: [facility] for tasks assessed/performed Overall Cognitive Status: Impaired/Different from baseline                                          Exercises General Exercises - Upper Extremity Shoulder Flexion: Both;20 reps;Supine;PROM;AAROM Elbow Flexion: Both;20 reps;Supine;PROM Elbow Extension: Both;20 reps;PROM;AAROM Wrist Flexion: Both;Supine;PROM;AAROM Wrist Extension: AROM;Both;Supine;PROM Digit Composite Flexion: Both;Supine;PROM;AAROM Composite Extension: Both;15 reps;PROM;AAROM   Shoulder Instructions            Pertinent Vitals/ Pain       Pain Score: 0-No pain      Progress Toward Goals  OT  Goals(current goals can now be found in the care plan section)  Progress towards OT goals: OT to reassess next treatment     Plan Discharge plan remains appropriate    Co-evaluation                 AM-PAC OT "6 Clicks" Daily Activity     Outcome Measure   Help from another person eating meals?: Total Help from another person taking care of personal grooming?: Total Help from another person toileting, which includes using toliet, bedpan, or urinal?: Total Help from another person bathing (including washing, rinsing, drying)?: Total Help from another person to put on and taking off regular upper body clothing?: Total Help from another person to put on and taking off regular lower body clothing?: Total 6 Click Score: 6       Activity Tolerance Patient limited by lethargy   Patient Left in bed;with call bell/phone within reach;with family/visitor present   Nurse Communication Mobility status        Time: 1250-1305 OT Time Calculation (min): 15 min  Charges: OT General Charges $OT Visit: 1 Visit OT Treatments $Therapeutic Activity: 8-22 mins  Kari Baars, OT Acute Rehabilitation Services [phone] Office- [phone]  [name] 07/15/2018, 2:18 PM

## 2018-07-15 NOTE — Progress Notes (Addendum)
12 Days Post-Op    CC:  Complaining of back pain this AM, not abdominal pain  Subjective: Complaining of back pain and says she feels miserable this a.m.  She took more in orally yesterday and she has good output through her ostomy this a.m.    Midline wound is stable and essentially unchanged.  Currently there is no drainage between her legs from the rectovaginal fistula.  Foley remains in place.    Palliative/ Dr. Girtha Rm is working on the pain control issues.  Objective: Vital signs in last 24 hours: Temp:  [97.5 F (36.4 C)-98.8 F (37.1 C)] 97.6 F (36.4 C) (11/26 0800) Pulse Rate:  [47-140] 81 (11/25 1700) Resp:  [0-27] 25 (11/25 1700) BP: (133-185)/(54-88) 151/54 (11/25 1700) SpO2:  [94 %-99 %] 99 % (11/25 1700) Last BM Date: 07/14/18 Pain:  Tylenol 650 x 3, fentanyl patch 12.5, Norco 10/325 x2 yesterday, 1 at 5 AM, robaxin 500 mg x 3, morphine x 2 ((2 mg), Morphine 4 mg x 2, 597 PO Blood x 2 units 1100 IV Urine 1975 200 stool Afebrile, VSS No labs this AM, Prealbumin still <5 Intake/Output from previous day: 11/25 0701 - 11/26 0700 In: 2453.2 [P.O.:597; I.V.:1031.5; Blood:723; IV Piggyback:101.7] Out: 2675 [OJJKK:9381; Stool:200] Intake/Output this shift: No intake/output data recorded.  General appearance: alert, cooperative and moderate distress Resp: clear to auscultation bilaterally GI: Open wound is clean and less soupy.  P.o. intake seems to be better.  She has good colostomy output.  Lab Results:  Recent Labs    07/13/18 0737 07/14/18 0355 07/14/18 2118  WBC 16.6* 16.7*  --   HGB 7.3* 6.7* 9.4*  HCT 23.2* 21.0* 28.2*  PLT 210 229  --     BMET Recent Labs    07/13/18 0445 07/14/18 0355  NA 136 133*  K 3.9 4.1  CL 104 104  CO2 23 23  GLUCOSE 125* 161*  BUN 18 16  CREATININE 0.46 0.39*  CALCIUM 7.3* 7.8*   PT/INR No results for input(s): LABPROT, INR in the last 72 hours.  Recent Labs  Lab 07/09/18 0539 07/10/18 0534 07/14/18 0355   AST 12* 10* 13*  ALT 10 9 10   ALKPHOS 70 70 110  BILITOT 0.6 0.4 0.4  PROT 4.0* 4.0* 4.6*  ALBUMIN 1.4* 1.3* 1.4*     Lipase     Component Value Date/Time   LIPASE 16 06/13/2018 1754     Medications: . acetaminophen  650 mg Oral Q6H  . chlorhexidine  15 mL Mouth Rinse BID  . Chlorhexidine Gluconate Cloth  6 each Topical Daily  . feeding supplement  1 Container Oral BID BM  . feeding supplement (ENSURE ENLIVE)  237 mL Oral Q24H  . fentaNYL  12.5 mcg Transdermal Q72H  . furosemide  20 mg Intravenous Q12H  . HYDROcodone-acetaminophen  1 tablet Oral Q6H  . insulin aspart  0-15 Units Subcutaneous Q4H  . insulin glargine  5 Units Subcutaneous QHS  . lidocaine  2 patch Transdermal Q24H  . mouth rinse  15 mL Mouth Rinse q12n4p  . metoprolol succinate  150 mg Oral Daily  . ondansetron  4 mg Oral Q8H  . saccharomyces boulardii  250 mg Oral BID  . sodium chloride flush  10-40 mL Intracatheter Q12H   CBC Latest Ref Rng & Units 07/14/2018 07/14/2018 07/13/2018  WBC 4.0 - 10.5 K/uL - 16.7(H) 16.6(H)  Hemoglobin 12.0 - 15.0 g/dL 9.4(L) 6.7(LL) 7.3(L)  Hematocrit 36.0 - 46.0 % 28.2(L) 21.0(L) 23.2(L)  Platelets 150 - 400 K/uL - 229 210    Assessment/Plan 1. Perforated sigmoid colon with fecal contamination Left colectomy, and transverse colostomy, takedown splenic flexure, 07/03/2018 -Rolm Bookbinder             POD#11 Left sided drain removed More alert today - to advance to full luquids  2. CT for vaginal drainage 11/21 Possible rectovaginal fistula between the LEFT vaginal cuff and the rectum.   3. Acute hypoxic respiratory failure- extubated 11/15 4. HXsevereulcerativecolitis  Zosyn - 11/14 >> 5. Hypertension 6. History ofacute onchronic anemia -  H/H - 7.6/23.5 - 07/11/2018 7. History of chronic atrial fibrillation- on Xarelto at home 8. Severe Malnutrition -  prealbumin 5.2 -07/07/2018 TNA per Medicine She should get labs tomorrow AM 9. DVT: Lovenox 10. DNR 11. Palliative care to meet with family on Monday, 11/25 12. Very deconditioned. 13. Hx of chronic back pain/Degenrative changes lower thoracic spine and lumbar spine  FEN: TPN/full liquids =>> D II diet, Nutrition consult ID: Rocephin/Flagyl 11/6 - 11/8;Zosyn 11/14 =>>day12  DVT: SCD  Follow up: Dr. Donne Hazel  Plan: I am going to just put her on a soft diet with the dietitian help with her p.o. intake.    I will defer to Dr. Girtha Rm on the pain control.  Today is day 12 of Zosyn.  We plan to continue last week until we found  the rectovaginal drainage which time it was continued.  She also still has her Foley in.    It takes 2 people to get her up out of bed and her back is very painful with movement.  Will discuss with Dr. Lucia Gaskins.    Recheck CBC in a.m.  H/H improved with transfusions.   LOS: 20 days    JENNINGS,WILLARD 07/15/2018 [phone]  Agree with above. It appears more of her pain med has been directed at chronic back pain - not acute post op pain.  Slowly getting better, thought badly deconditioned with no self ambulation.  Alphonsa Overall, MD, [facility] Surgery Pager: [phone] Office phone:  [phone]

## 2018-07-15 NOTE — Progress Notes (Signed)
Felicia Carlson is more sedated today. I had a follow-up discussion with her daughter Felicia Carlson. The family is in agreement that they feel she has lived a long, good life and that at this point she is suffering in the hospital without a great chance at making a full recovery back to an independent level of functioning.  Felicia Carlson clearly expresses a desire for dignity, comfort and to be surrounded by family at home for her remaining days and to not remain in the hospital - they fear continued hospitalization will only cause her harm and they do not want to miss a window of opportunity to be alert and enjoy her family.  I have notified Dr. Olevia Carlson and we discussed current orders- for her symptom mananagement regimen at home I recommend continuing QID hydrocodone and PRN Roxanol 20mg  q2 prn and dispense 30 ml. Will request [facility] hospice referral and begin arranging DME.  Will continue to support Felicia Carlson through this transition of care and support her family.  Felicia Hacker, DO Palliative Medicine [phone]  Time: 35 min Greater than 50%  of this time was spent counseling and coordinating care related to the above assessment and plan.

## 2018-07-15 NOTE — Progress Notes (Signed)
[facility] PROGRESS NOTE    Progress Note  Felicia Carlson  YJE:[id number] DOB: 02/27/1925 DOA: 06/25/2018 PCP: Lajean Manes, MD     Brief Narrative:   Felicia Carlson is an 84 y.o. female past medical history significant of ulcerative colitis, chronic atrial fibrillation on Xarelto, recently discharged on 08/20/2017 for ulcerative colitis flares placed on steroids a flex sig during that time showed left-sided ulcerative colitis presented on 06/25/2018 for bright red blood per rectum and abdominal pain for a week, on 07/02/2018 patient had worsening abdominal pain a CT scan of the abdomen and pelvis showed free air, initially it showed no extravasation of oral contrast and the patient was nontoxic-appearing per chart, during this time the patient was felt not to require emergent surgery, initially also the patient was hesitant about proceeding with surgery however she deteriorated on 07/03/2018 at which time surgery recommended emergent exploratory laparotomy and when colectomy with colostomy on 07/03/2018  Assessment/Plan:   Pneumoperitoneum perforation of sigmoid: Status post left colectomy, colostomy on 07/03/2018: Likely due to ulcerative colitis. - Currently on TPN, IV Zosyn for presumed intra-abdominal infection, antibiotic management per surgery. - CT scan of the abdomen and pelvis on 07/10/2018: Show possible rectovaginal fistula, no pelvic abscess or intraperitoneal air, she does have choledocholithiasis - She has a mild drop in hemoglobin to 6.7 on 07/14/2018,  transfuse 2 units of packed red blood cells on 11.25.2019. -Her fentanyl patch was decreased to 12.5 she relates her pain is out of control. She has required several doses of IV as needed morphine, and total she has received 12 mg of IV morphine as needed +3 tablets of hydrocodone,  - Further management of her pain per hospice and palliative care.  Acute hypoxic respiratory failure with hypoxia: - Intubated on 07/03/2018,  status post extubation on 07/04/2018. - Currently on nasal oxygen saturating above 90%. - Continue incentive spirometry.  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 - We have DC'd steroids as per surgery recommendation. - Further management per surgery.  Essential hypertension: Blood pressure better controlled.  Chronic atrial fibrillation: Chads VASC 5, holding Xarelto.  Risk and benefits were discussed with family and they understand the risk. Cont. to hold Xarelto, Lovenox subcutaneously. She is sinus rhythm rate controlled.  Change her metoprolol to oral.  Acute blood loss anemia/ Acute on chronic anemia Hemoglobin has been stable ranging from 8.5-8.1. Transfuse 1 unit of packed red blood cells on 07/06/2018 Hemoglobin dropped on 07/14/2018, transfuse 2 units of packed red blood cells, again on 07/14/2018 Hemoglobin is 9.7 we will continue to follow CBC intermittently.  Hyperglycemia: Likely due to steroids, now on clears and TPN.  Hematuria: Improving.  Probably traumatic.  Third spacing: We will start low-dose Lasix twice a day, she has bilateral pleural effusion and body wall edema  Goals of care: Hospice imperative care was consulted, the patient is a DNR, the family still wants to give her a try and will like aggressive treatment. Palliative Care following. I am really concerned that she has a poor prognoses.  RN Pressure Injury Documentation:    Estimated body mass index is 33.2 kg/m as calculated from the following:   Height as of this encounter: 5\' 1"  (1.549 m).   Weight as of this encounter: 79.7 kg. Malnutrition Type:  Nutrition Problem: Inadequate oral intake Etiology: decreased appetite   Malnutrition Characteristics:  Signs/Symptoms: per patient/family report   Nutrition Interventions:  Interventions: Ensure Enlive (each supplement provides  350kcal and 20 grams of protein), Boost Breeze, MVI, Magic cup    DVT prophylaxis: lovenox Family Communication:none Disposition Plan/Barrier to D/C: unable to determine Code Status:     Code Status Orders  (From admission, onward)         Start     Ordered   07/05/18 1038  Do not attempt resuscitation (DNR)  Continuous    Question Answer Comment  In the event of cardiac or respiratory ARREST Do not call a "code blue"   In the event of cardiac or respiratory ARREST Do not perform Intubation, CPR, defibrillation or ACLS   In the event of cardiac or respiratory ARREST Use medication by any route, position, wound care, and other measures to relive pain and suffering. May use oxygen, suction and manual treatment of airway obstruction as needed for comfort.      07/05/18 1038        Code Status History    Date Active Date Inactive Code Status Order ID Comments User Context   07/03/2018 2012 07/05/2018 1038 Full Code [id number]  Rolm Bookbinder, MD Inpatient   06/25/2018 1825 07/03/2018 2012 DNR [id number]  [name], MD Inpatient   06/25/2018 1825 06/25/2018 1825 Full Code [id number]  [name], MD Inpatient   06/13/2018 2323 06/20/2018 1619 DNR [id number]  [name], MD Inpatient   10/26/2014 1912 10/30/2014 1814 Full Code [id number]  [name], MD Inpatient    Advance Directive Documentation     Most Recent Value  Type of Advance Directive  Healthcare Power of Attorney  Pre-existing out of facility DNR order (yellow form or pink MOST form)  -  "MOST" Form in Place?  -        IV Access:    Peripheral IV   Procedures and diagnostic studies:   No results found.   Medical Consultants:    None.  Anti-Infectives:   Zosyn started on 07/03/2018>>  Subjective:    Felicia Carlson,  she relate her pain is worsening yesterday, she has received several doses of IV and oral medication without any relief Objective:    Vitals:    07/14/18 1700 07/14/18 2026 07/15/18 0046 07/15/18 0423  BP: (!) 151/54     Pulse: 81     Resp: (!) 25     Temp:  97.9 F (36.6 C) 98.3 F (36.8 C) (!) 97.5 F (36.4 C)  TempSrc:  Oral Oral Oral  SpO2: 99%     Weight:      Height:        Intake/Output Summary (Last 24 hours) at 07/15/2018 0714 Last data filed at 07/15/2018 0600 Gross per 24 hour  Intake 2453.23 ml  Output 2675 ml  Net -221.77 ml   Filed Weights   07/11/18 1114 07/13/18 0829 07/14/18 0500  Weight: 75.7 kg 81.9 kg 79.7 kg    Exam: General exam: In no acute distress, she has anasarca. Respiratory system: Good air movement and clear to auscultation. Cardiovascular system: S1 & S2 heard, RRR. Gastrointestinal system: Abdomen is nondistended, soft and ostomy in place. Central nervous system: Alert and oriented. No focal neurological deficits. Extremities: 3+ edema Skin: No rashes, lesions or ulcers Psychiatry: Judgement and insight appear normal. Mood & affect appropriate.    Data Reviewed:    Labs: Basic Metabolic Panel: Recent Labs  Lab 07/09/18 0539 07/10/18 0534 07/11/18 0918 07/12/18 0451 07/13/18 0445 07/14/18 0355  NA 130* 131* 134* 136 136  133*  K 4.0 4.3 4.4 4.2 3.9 4.1  CL 100 99 104 107 104 104  CO2 27 26 25 24 23 23   GLUCOSE 237* 274* 190* 106* 125* 161*  BUN 14 20 20 21 18 16   CREATININE 0.46 0.41* 0.54 0.43* 0.46 0.39*  CALCIUM 7.3* 7.3* 7.5* 7.7* 7.3* 7.8*  MG 2.0 2.1  --   --  1.9 2.0  PHOS 2.7 3.1  --   --  2.6 3.3   GFR Estimated Creatinine Clearance: 44.7 mL/min (A) (by C-G formula based on SCr of 0.39 mg/dL (L)). Liver Function Tests: Recent Labs  Lab 07/09/18 0539 07/10/18 0534 07/14/18 0355  AST 12* 10* 13*  ALT 10 9 10   ALKPHOS 70 70 110  BILITOT 0.6 0.4 0.4  PROT 4.0* 4.0* 4.6*  ALBUMIN 1.4* 1.3* 1.4*   No results for input(s): LIPASE, AMYLASE in the last 168 hours. No results for input(s): AMMONIA in the last 168 hours. Coagulation profile No results  for input(s): INR, PROTIME in the last 168 hours.  CBC: Recent Labs  Lab 07/10/18 0534 07/11/18 0918 07/13/18 0737 07/14/18 0355 07/14/18 2118  WBC 12.6* 13.1* 16.6* 16.7*  --   NEUTROABS  --   --   --  14.5*  --   HGB 7.9* 7.6* 7.3* 6.7* 9.4*  HCT 24.7* 23.5* 23.2* 21.0* 28.2*  MCV 93.6 95.1 95.1 95.5  --   PLT 181 182 210 229  --    Cardiac Enzymes: No results for input(s): CKTOTAL, CKMB, CKMBINDEX, TROPONINI in the last 168 hours. BNP (last 3 results) No results for input(s): PROBNP in the last 8760 hours. CBG: Recent Labs  Lab 07/14/18 1104 07/14/18 1510 07/14/18 1947 07/14/18 2353 07/15/18 0345  GLUCAP 88 169* 174* 159* 145*   D-Dimer: No results for input(s): DDIMER in the last 72 hours. Hgb A1c: No results for input(s): HGBA1C in the last 72 hours. Lipid Profile: Recent Labs    07/14/18 0355  TRIG 70   Thyroid function studies: No results for input(s): TSH, T4TOTAL, T3FREE, THYROIDAB in the last 72 hours.  Invalid input(s): FREET3 Anemia work up: No results for input(s): VITAMINB12, FOLATE, FERRITIN, TIBC, IRON, RETICCTPCT in the last 72 hours. Sepsis Labs: Recent Labs  Lab 07/10/18 0534 07/11/18 0918 07/13/18 0737 07/14/18 0355  WBC 12.6* 13.1* 16.6* 16.7*   Microbiology No results found for this or any previous visit (from the past 240 hour(s)).   Medications:   . acetaminophen  650 mg Oral Q6H  . chlorhexidine  15 mL Mouth Rinse BID  . Chlorhexidine Gluconate Cloth  6 each Topical Daily  . feeding supplement  1 Container Oral BID BM  . feeding supplement (ENSURE ENLIVE)  237 mL Oral Q24H  . fentaNYL  12.5 mcg Transdermal Q72H  . furosemide  20 mg Intravenous Q12H  . HYDROcodone-acetaminophen  1 tablet Oral Q6H  . insulin aspart  0-15 Units Subcutaneous Q4H  . insulin glargine  5 Units Subcutaneous QHS  . lidocaine  2 patch Transdermal Q24H  . mouth rinse  15 mL Mouth Rinse q12n4p  . metoprolol succinate  150 mg Oral Daily  .  ondansetron  4 mg Oral Q8H  . saccharomyces boulardii  250 mg Oral BID  . sodium chloride flush  10-40 mL Intracatheter Q12H   Continuous Infusions: . sodium chloride Stopped (07/14/18 1812)  . methocarbamol (ROBAXIN) IV Stopped (07/15/18 0055)  . piperacillin-tazobactam (ZOSYN)  IV 3.375 g (07/15/18 0210)  . TPN ADULT (  ION) 75 mL/hr at 07/14/18 2011      LOS: 20 days   [name]  [facility]   *Please refer to [email or web address], password [email or web address] to get updated schedule on who will round on this patient, as hospitalists switch teams weekly. If 7PM-7AM, please contact night-coverage at [email or web address], password [email or web address] for any overnight needs.  07/15/2018, 7:14 AM

## 2018-07-15 NOTE — Progress Notes (Signed)
PHARMACY - ADULT TOTAL PARENTERAL NUTRITION CONSULT NOTE   Pharmacy Consult for TPN Indication: s/p sigmoid colon perforation repair, postop ileus  Patient Measurements: Height: _0  (154.9 cm) Weight: 175 lb 11.3 oz (79.7 kg) IBW/kg (Calculated) : 47.8   Body mass index is 33.2 kg/m.  Insulin Requirements: 10 units of moderate SSI  Lantus 5 units daily added on 11/21 by [facility]  Current Nutrition: Advanced to dysphagia diet (11/25) - boost breeze BID, Ensure Enlive daily  - Magic cup daily - Per MD report, she is not taking in much PO   IVF: IVF d/c  Central access: PICC TPN start date: 11/17  ASSESSMENT                                                                                                          HPI: Patient with PMH significant for ulcerative colitis, chronic afib on xarelto (not taking PTA), CKD, HTN, and breast cancer. Recently discharged from [facility] on 06/20/18 for UC flare and prescribed steroids. Presents this admission on 07/02/18 with abdominal cramping and constipation. CT abdomen showed large amount of free air.  Significant events:  11/14: colostomy and colectomy 11/17: start TPN 11/18 NG tube removed by patient.  No nausea, but essentially no ostomy output, no flatus. 11/20 Patient is SD-ICU status, start lipids in TPN today. 11/21 RN reports stool appearing fluid leaking from vaginal area.  CT shows possible rectovaginal fistula.  No surgical intervention planned.  Today:   Glucose:  No hx DM, A1c 6.6 05/2018. CBG range 71-174, most are at/near 150, with 2 CBGs < 90.  MD added low dose Lantus 5 units daily on 11/21  Electrolytes: Na improved to WNL, others remain WNL including corrected Ca   Renal: SCr WNL/low  I/O -220 mL over past 24 hrs (UOP 1 ml/kg/hr); with I/O +17 L this admission  Lasix Q12h added 11/25  LFTs: AST low, ALT and Alk Phos WNL, Tbili WNL, albumin low at 1.4 (11/25)  TGs: 79 (11/18), 70 (11/25)  Prealbumin: 5.2 (11/18), <5  (11/25)   NUTRITIONAL GOALS                                                                                             RD recs: (11/25): 1650-1850 kcal, 80-90g protein per day  Custom TPN at goal rate of 75 ml/hr provides: 90 g/day protein (50 g/L) 45 g/day Lipid  (25 g/L) 306 g/day Dextrose (17 %) 1850 Kcal/day   PLAN  At 1800 today:  Continue TPN at goal rate of 75 ml/hr. -  Provides 90 g of protein, 306 g of dextrose, 45g Lipids which provides 1850 kCals per day, meeting 100 % of goal kcal and AA  Electrolytes in TPN: Continue increased Na 75 meq/L, K+ 60 meq/L and Phos to 25 mmol/L, Cl:Ac ratio 1:1  TPN to contain standard multivitamins and trace elements.  Continue Famotidine 3m in TPN.  IVF per MD (d/c'd)  Continue moderate SSI q4h.  Continue Lantus 5 units daily per MD.  TPN lab panels on Mondays & Thursdays.  BMET daily with AM labs.  F/u advancement of diet and ability to wean TPN.  If TPN continues, consider cycled TPN?  F/u daily.  CGretta ArabPharmD, BCPS Pager [phone]/26/2019 9:28 AM

## 2018-07-16 DIAGNOSIS — Z452 Encounter for adjustment and management of vascular access device: Secondary | ICD-10-CM

## 2018-07-16 DIAGNOSIS — I482 Chronic atrial fibrillation, unspecified: Secondary | ICD-10-CM

## 2018-07-16 MED ORDER — FENTANYL 12 MCG/HR TD PT72: 12.5000 ug | MEDICATED_PATCH | TRANSDERMAL | 0 refills | Status: AC

## 2018-07-16 MED ORDER — SODIUM CHLORIDE 0.9% FLUSH
10.0000 mL | Freq: Two times a day (BID) | INTRAVENOUS | Status: DC
  Administered 2018-07-16: 10 mL

## 2018-07-16 MED ORDER — SODIUM CHLORIDE 0.9% FLUSH: 10.0000 mL | INTRAVENOUS | Status: DC | PRN

## 2018-07-16 MED ORDER — MORPHINE SULFATE (PF) 2 MG/ML IV SOLN: 2.0000 mg | Freq: Once | INTRAVENOUS | Status: DC

## 2018-07-16 MED ORDER — CHLORHEXIDINE GLUCONATE CLOTH 2 % EX PADS
6.0000 | MEDICATED_PAD | Freq: Every day | CUTANEOUS | Status: DC
  Administered 2018-07-16: 6 via TOPICAL

## 2018-07-16 MED ORDER — MORPHINE SULFATE (CONCENTRATE) 10 MG/0.5ML PO SOLN: 20.0000 mg | ORAL | 0 refills | Status: AC | PRN

## 2018-07-16 MED ORDER — ONDANSETRON 4 MG PO TBDP: 4.0000 mg | ORAL_TABLET | Freq: Four times a day (QID) | ORAL | 0 refills | Status: AC | PRN

## 2018-07-16 NOTE — Progress Notes (Addendum)
13 Days Post-Op   Subjective: She seems brighter this AM.  Not complaining of pain like before.  Wound OK, Ostomy working, foley in.  Currently very little if any drainage between her legs. Foley in.    Objective: Vital signs in last 24 hours: Temp:  [97.4 F (36.3 C)-98.5 F (36.9 C)] 98.5 F (36.9 C) (11/27 0412) Pulse Rate:  [41-125] 41 (11/27 0600) Resp:  [14-24] 17 (11/27 0600) BP: (102-134)/(53-67) 102/53 (11/26 1200) SpO2:  [93 %-98 %] 95 % (11/27 0600) Last BM Date: 07/14/18 10 PO 2068 IV 2550 urine Colostomy not recorded Afebrile, VSS BMP OK  Intake/Output from previous day: 11/26 0701 - 11/27 0700 In: 2068.1 [P.O.:10; I.V.:1768.2; IV Piggyback:289.9] Out: 2550 [Urine:2550] Intake/Output this shift: No intake/output data recorded.  General appearance: alert, cooperative, no distress and seem very comfortable this AM Resp: clear to auscultation bilaterally GI: soft, sore, open site looks fine, still some soupy exudate at the very base, but sides all look better.  Ostomy working.  Lab Results:  Recent Labs    07/13/18 0737 07/14/18 0355 07/14/18 2118  WBC 16.6* 16.7*  --   HGB 7.3* 6.7* 9.4*  HCT 23.2* 21.0* 28.2*  PLT 210 229  --     BMET Recent Labs    07/14/18 0355 07/15/18 1000  NA 133* 136  K 4.1 4.0  CL 104 105  CO2 23 25  GLUCOSE 161* 104*  BUN 16 18  CREATININE 0.39* 0.41*  CALCIUM 7.8* 7.8*   PT/INR No results for input(s): LABPROT, INR in the last 72 hours.  Recent Labs  Lab 07/10/18 0534 07/14/18 0355  AST 10* 13*  ALT 9 10  ALKPHOS 70 110  BILITOT 0.4 0.4  PROT 4.0* 4.6*  ALBUMIN 1.3* 1.4*     Lipase     Component Value Date/Time   LIPASE 16 06/13/2018 1754     Medications: . fentaNYL  12.5 mcg Transdermal Q72H  . furosemide  20 mg Intravenous Q12H  . HYDROcodone-acetaminophen  1 tablet Oral Q6H  . lidocaine  2 patch Transdermal Q24H  . metoprolol succinate  150 mg Oral Daily  . ondansetron  4 mg Oral Q8H     Assessment/Plan 1. Perforated sigmoid colon with fecal contamination Left colectomy, and transverse colostomy, takedown splenic flexure, 07/03/2018 -Rolm Bookbinder More alert today - advanced to reg diet  2. CT for vaginal drainage 11/21 Possible rectovaginal fistula between the LEFT vaginal cuff and the rectum.   3. Acute hypoxic respiratory failure- extubated 11/15 4. HXsevereulcerativecolitis  Zosyn - 11/14 >> 5. Hypertension 6. History ofacute onchronic anemia -   Hgb - 9.2 - 07/14/2018 7. History of chronic atrial fibrillation- on Xarelto at home 8. Severe Malnutrition - prealbumin 5.2 -07/07/2018 TNA per Medicine She should get labs tomorrow AM 9. DVT: Lovenox 10. DNR 11. Very deconditioned. 13. Hx of chronic back pain/Degenrative changes lower thoracic spine and lumbar spine  FEN: TPN/full liquids =>> D II diet, Nutrition consult ID: Rocephin/Flagyl 11/6 - 11/8;Zosyn 11/14 =>>day12  DVT: SCD  Foley:  in Follow up: Dr. Donne Hazel  Plan:  She is being transitioned home with Hospice care.   I will put follow up info in the AVS, along with directions for dressing changes.      LOS: 21 days    JENNINGS,WILLARD 07/16/2018 [phone]  Agree with above. Seems to be a little better.  Alphonsa Overall, MD, [facility] Pager: [phone] Office phone:  [phone]

## 2018-07-16 NOTE — Care Management Important Message (Signed)
Important Message  Patient Details  Name: Felicia Carlson MRN: [id number] Date of Birth: 06-23-1925   Medicare Important Message Given:  Yes    Taryne Kiger 07/16/2018, 10:03 AM

## 2018-07-16 NOTE — Discharge Instructions (Signed)
[facility]      [facility] °[phone] ° °OPEN ABDOMINAL SURGERY: POST OP INSTRUCTIONS ° °Always review your discharge instruction sheet given to you by the facility where your surgery was performed. ° °IF YOU HAVE DISABILITY OR FAMILY LEAVE FORMS, YOU MUST BRING THEM TO THE OFFICE FOR PROCESSING.  PLEASE DO NOT GIVE THEM TO YOUR DOCTOR. ° °1. A prescription for pain medication may be given to you upon discharge.  Take your pain medication as prescribed, if needed.  If narcotic pain medicine is not needed, then you may take acetaminophen (Tylenol) or ibuprofen (Advil) as needed. °2. Take your usually prescribed medications unless otherwise directed. °3. If you need a refill on your pain medication, please contact your pharmacy. They will contact our office to request authorization.  Prescriptions will not be filled after 5pm or on week-ends. °4. You should follow a light diet the first few days after arrival home, such as soup and crackers, pudding, etc.unless your doctor has advised otherwise. A high-fiber, low fat diet can be resumed as tolerated.   Be sure to include lots of fluids daily. Most patients will experience some swelling and bruising on the chest and neck area.  Ice packs will help.  Swelling and bruising can take several days to resolve °5. Most patients will experience some swelling and bruising in the area of the incision. Ice pack will help. Swelling and bruising can take several days to resolve..  °6. It is common to experience some constipation if taking pain medication after surgery.  Increasing fluid intake and taking a stool softener will usually help or prevent this problem from occurring.  A mild laxative (Milk of Magnesia or Miralax) should be taken according to package directions if there are no bowel movements after 48 hours. °7.  You may have steri-strips (small skin tapes) in place directly over the incision.  These strips should be left on the skin for 7-10 days.  If your  surgeon used skin glue on the incision, you may shower in 24 hours.  The glue will flake off over the next 2-3 weeks.  Any sutures or staples will be removed at the office during your follow-up visit. You may find that a light gauze bandage over your incision may keep your staples from being rubbed or pulled. You may shower and replace the bandage daily. °8. ACTIVITIES:  You may resume regular (light) daily activities beginning the next day--such as daily self-care, walking, climbing stairs--gradually increasing activities as tolerated.  You may have sexual intercourse when it is comfortable.  Refrain from any heavy lifting or straining until approved by your doctor. °a. You may drive when you no longer are taking prescription pain medication, you can comfortably wear a seatbelt, and you can safely maneuver your car and apply brakes °b. Return to Work: ___________________________________ °9. You should see your doctor in the office for a follow-up appointment approximately two weeks after your surgery.  Make sure that you call for this appointment within a day or two after you arrive home to insure a convenient appointment time. °OTHER INSTRUCTIONS:  °_____________________________________________________________ °_____________________________________________________________ ° °WHEN TO CALL YOUR DOCTOR: °1. Fever over 101.0 °2. Inability to urinate °3. Nausea and/or vomiting °4. Extreme swelling or bruising °5. Continued bleeding from incision. °6. Increased pain, redness, or drainage from the incision. °7. Difficulty swallowing or breathing °8. Muscle cramping or spasms. °9. Numbness or tingling in hands or feet or around lips. ° °The clinic staff is available to   answer your questions during regular business hours.  Please dont hesitate to call and ask to speak to one of the nurses if you have concerns.  For further questions, please visit [email or web address]   Colostomy Home Guide, Adult A  colostomy is a surgical procedure to make an opening (stoma) for stool (feces) to leave your body. This surgery is done when a medical condition prevents stool from leaving your body through the end of the large intestine (rectum). During the surgery, part of the large intestine (colon) is attached to the stoma that is made in the front of your abdomen. A bag (pouch) is fitted over the stoma. Stool and gas will collect in the bag. After having this surgery, you will need to empty and change your colostomy bag as needed. You will also need to care for the stoma. How do I care for my stoma? Your stoma should look pink, red, and moist, like the inside of your cheek. At first, the stoma may be swollen, but this swelling will go away within 6 weeks. To care for the stoma: Keep the skin around the stoma clean and dry. Use a clean, soft washcloth to gently wash the stoma and the skin around it. Use warm water and only use cleansers recommended by your health care provider. Rinse the stoma area with plain water. Dry the area well. Use stoma powder or ointment on your skin only as told by your health care provider. Do not use any other powders, gels, wipes, or creams on your skin. Change your colostomy bag if your skin becomes irritated. Irritation may indicate that the bag is leaking. Check your stoma area every day for signs of infection. Check for: More redness, swelling, or pain. More fluid or blood. Pus or warmth. Measure the stoma opening regularly and record the size. Watch for changes. Share this information with your health care provider.  How do I care for my colostomy bag? The bag that fits over the stoma can have either one or two pieces. One-piece bag: The skin barrier and the bag are combined in a single unit. Two-piece bag: The skin barrier and the bag are separate pieces that attach to each other.  Empty your bag at bedtime and whenever it is one-third to one-half full. Do not let more  stool or gas build up. This could cause the bag to leak. Some colostomy bags have a built-in gas release valve. Change the bag every 3-4 days or as told by your health care provider. Also change the bag if it is leaking or separating from the skin or your skin looks irritated. How do I empty my colostomy bag? Before you leave the hospital, you will be taught how to empty your bag. Follow these basic steps: Wash your hands with soap and water. Sit far back on the toilet. Put several pieces of toilet paper into the toilet water. This will prevent splashing as you empty the stool into the toilet. Remove the clip or the velcro from the tail end of the bag. Unroll the tail, then empty stool into the toilet. Clean the tail with toilet paper. Reroll the tail, and close it with the clip or velcro. Wash your hands again.  How do I change my colostomy bag? Before you leave the hospital, you will be taught how to change your bag. Always have colostomy supplies with you, and follow these basic steps: Wash your hands with soap and water. Have paper towels or tissues near you to  clean any discharge. Use a template to pre-cut the skin barrier. Smooth any rough edges. If using a two-piece bag, attach the bag and the skin barrier to each other. Add the barrier ring, if you use one. If your stools are watery, add a few cotton balls to the new bag to absorb the liquid. Remove the old bag and skin barrier. Gently push the skin away from the barrier with your fingers or a warm cloth. Wash your hands again. Then clean the stoma area as directed with water or with mild soap and water. Use water to rinse away any soap. Dry the skin. You may use the cool setting on a hair dryer to do this. If directed, apply stoma powder or skin barrier gel to the skin. Dry the skin again. Warm the skin barrier with your hands or a warm compress. Remove the paper from the sticky (adhesive) strip of the skin barrier. Press the  adhesive strip onto the skin around the stoma. Gently rub the skin barrier onto the skin. This creates heat that helps the barrier to stick. Apply stoma tape to the edges of the skin barrier.  What are some general tips? Avoid wearing clothes that are tight directly over your stoma. You may shower or bathe with the colostomy bag on or off. Do not use harsh or oily soaps or lotions. Dry the skin and bag after bathing. Store all supplies in a cool, dry place. Do not leave supplies in extreme heat because parts can melt. Whenever you leave home, take an extra skin barrier and colostomy bag with you. If your colostomy bag gets wet, you can dry it with a hair dryer on the cool setting. To prevent odor, put drops of ostomy deodorizer in the colostomy bag. Your health care provider may also recommend putting ostomy lubricant inside the bag. This helps the stool to slide out of the bag more easily and completely. Contact a health care provider if: You have more redness, swelling, or pain around your stoma. You have more fluid or blood coming from your stoma. Your stoma feels warm to the touch. You have pus coming from your stoma. Your stoma extends in or out farther than normal. You need to change the bag every day. You have a fever. Get help right away if: Your stool is bloody. You vomit. You have trouble breathing. This information is not intended to replace advice given to you by your health care provider. Make sure you discuss any questions you have with your health care provider. Document Released: 08/09/2003 Document Revised: 12/15/2015 Document Reviewed: 12/09/2013 Elsevier Interactive Patient Education  2018 [place].  Mechanical Wound Debridement Remove current packing, gently use wet 4 x 4 to clean the sides and base of the wound with the wet 4 x 4.  This will help remove non-viable tissue.  When you are done repack with 4 x 4's wet with sterile saline.  Wet these with saline,  squeeze out the extra fluid.  It should be like you would use to wash your face.  Open the 4 x 4's and pack inside the wound from the base up.  Keep the wet portion within the sides of the wound.  Dry dressing, paper tape will be easier on her good surface skin.     Mechanical wound debridement is a treatment to remove dead tissue from a wound. This helps the wound heal. The treatment involves cleaning the wound (irrigation) and using a pad or gauze (dressing)  to remove dead tissue and debris from the wound. There are different types of mechanical wound debridement. Depending on the wound, you may need to repeat this procedure or change to another form of debridement as your wound starts to heal. Tell a health care provider about:  Any allergies you have.  All medicines you are taking, including vitamins, herbs, eye drops, creams, and over-the-counter medicines.  Any blood disorders you have.  Any medical conditions you have, including any conditions that: ? Cause a significant decrease in blood circulation to the part of the body where the wound is, such as peripheral vascular disease. ? Compromise your defense (immune) system or white blood count.  Any surgeries you have had.  Whether you are pregnant or may be pregnant. What are the risks? Generally, this is a safe procedure. However, problems may occur, including:  Infection.  Bleeding.  Damage to healthy tissue in and around your wound.  Soreness or pain.  Failure of the wound to heal.  Scarring.  What happens before the procedure? You may be given antibiotic medicine to help prevent infection. What happens during the procedure?  Your health care provider may apply a numbing medicine (topical anesthetic) to the wound.  Your health care provider will irrigate your wound with a germ-free (sterile), salt-water (saline) solution. This removes debris, bacteria, and dead tissue.  Depending on what type of mechanical wound  debridement you are having, your health care provider may do one of the following: ? Put a dressing on your wound. You may have dry gauze pad placed into the wound. Your health care provider will remove the gauze after the wound is dry. Any dead tissue and debris that has dried into the gauze will be lifted out of the wound (wet-to-dry debridement). ? Use a type of pad (monofilament fiber debridement pad). This pad has a fluffy surface on one side that picks up dead tissue and debris from your wound. Your health care provider wets the pad and wipes it over your wound for several minutes. ? Irrigate your wound with a pressurized stream of solution such as saline or water.  Once your health care provider is finished, he or she may apply a light dressing to your wound. The procedure may vary among health care providers and hospitals. What happens after the procedure?  You may receive medicine for pain.  You will continue to receive antibiotic medicine if it was started before your procedure. This information is not intended to replace advice given to you by your health care provider. Make sure you discuss any questions you have with your health care provider. Document Released: 04/27/2015 Document Revised: 01/12/2016 Document Reviewed: 12/15/2014 Elsevier Interactive Patient Education  [employer].

## 2018-07-16 NOTE — Progress Notes (Signed)
[facility] and [facility])  Hospice eligibility has been confirmed.  Spoke with dtr Mateo Flow, she states [facility] cannot deliver necessary equipment until 4-8pm, but they felt closer to 8pm.    Please contact daughter Mateo Flow [phone]) prior to pt leaving so she will be aware of when she will arrive.  Venia Carbon BSN, RN [facility] Liaison (listed in [employer]

## 2018-07-16 NOTE — Discharge Summary (Addendum)
Physician Discharge Summary  Felicia Carlson PJA:[id number] DOB: 03-27-1925 DOA: 06/25/2018  PCP: Felicia Manes, MD  Admit date: 06/25/2018 Discharge date: 07/23/2018  Admitted From: home Disposition:  Home  Recommendations for Outpatient Follow-up:  1. [facility] to follow up at home.   Home Health:No Equipment/Devices:None  Discharge Condition:stable CODE STATUS:full Diet recommendation: Heart Healthy  Brief/Interim Summary: 84 y.o. female past medical history significant of ulcerative colitis, chronic atrial fibrillation on Xarelto, recently discharged on 08/20/2017 for ulcerative colitis flares placed on steroids a flex sig during that time showed left-sided ulcerative colitis presented on 06/25/2018 for bright red blood per rectum and abdominal pain for a week, on 07/02/2018 patient had worsening abdominal pain a CT scan of the abdomen and pelvis showed free air, initially it showed no extravasation of oral contrast and the patient was nontoxic-appearing per chart, during this time the patient was felt not to require emergent surgery, initially also the patient was hesitant about proceeding with surgery however she deteriorated on 07/03/2018 at which time surgery recommended emergent exploratory laparotomy and when colectomy with colostomy on 07/03/2018  Discharge Diagnoses:  Principal Problem:   Feculent peritonitis from sigmoid perforation Active Problems:   Lower GI bleed   Ulcerative colitis with complication (HCC)   Abdominal cramping   Colitis, acute   Abdominal infection ([place])   Constipation   Rectal bleeding   Pneumoperitoneum   Hyperglycemia   SI (sacroiliac) pain   Chronic atrial fibrillation   Chronic anticoagulation   Acute respiratory failure with hypoxia ([place])   Acute blood loss anemia   Acute on chronic anemia   Perforation of sigmoid colon s/p Hartmann colectomy/colostomy 07/03/2018   Lumbar radiculopathy   Palliative care patient   Sepsis  ([place]) Sepsis due to abdominal pain with pneumoperitoneum perforation of the sigmoid status post left colectomy and colostomy on 07/03/2018 She returned scented to the hospital with bright red blood per rectum and abdominal pain on 06/25/2018, on 07/02/2018 she had worsening abdominal pain CT scan of the abdomen pelvis showed pneumoperitoneum, surgery and GI were consulted. She had surgical intervention on 07/03/2018, she was started on IV Zosyn and TPN. CT scan of the abdomen and pelvis on 07/10/2018 show possible rectovaginal fistula no abscess or intra-abdominal peritoneal air. She was not improving her pain was hard to control.  She was not tolerating her diet adequately. She had a drop in her hemoglobin to 6.7 on 07/14/2018 she is was transfused 2 units of packed red blood cells. She required high-dose narcotics was placed on a fentanyl patch. Hospice and palliative care was consulted after several days of the patient not improving as she was not tolerating orals. The family decided to take her home with hospice care and focus on comfort. She was placed on a fentanyl patch and oral Roxanol which was controlling her pain.  Acute respiratory failure with hypoxia: Intubated on 07/03/2018 extubated on 07/04/2018. Currently satting greater than 90% on room air.  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 After she perforated surgery recommended to hold steroids.  Essential hypertension: All of her antihypertensive medications were held.  Chronic atrial fibrillation: With a chads vas score of 5 Xarelto was DC'd. She is currently in sinus rhythm.  Hematuria: Resolved probably traumatic.  Third spacing: Likely due to hypoalbuminemia.  Goals of care: Hospice and palliative care met with family she was made DNR as she had a poor prognosis family  decided to take her home with comfort  measures.  Discharge Instructions  Discharge Instructions    Change dressing   Complete by:  As directed    Wet to dry dressing to open abdominal wound BID.  Take out current dressing, use wet 4 x 4 to gently clean the sides and the base of the wound, removing loose exudate.  The repack with open 4 x 4's wet with sterile saline. Face cloth wet, Keep packing inside the wound, not on the good skin.  Dry dressing, use paper tape   BID   Diet - low sodium heart healthy   Complete by:  As directed    Increase activity slowly   Complete by:  As directed      Allergies as of 07/17/2018      Reactions   Isovue [iopamidol] Hives, Rash   Pt  Had hives, rash and erythema her chest, face and neck.  Slight eye swelling.  Pt given 50 mg po benadryl.  Pt also developed severe abd pain during contrast injection and continued to get worse.  For those reasons the radiologist had the pt transported by EMS [facility].  Pt needs full premeds in the future.  Felicia Carlson  RTRCT   Tetanus Immune Globulin Swelling      Medication List    STOP taking these medications   meloxicam 15 MG tablet Commonly known as:  MOBIC   oxyCODONE-acetaminophen 5-325 MG tablet Commonly known as:  PERCOCET/ROXICET   predniSONE 10 MG tablet Commonly known as:  DELTASONE   Rivaroxaban 15 MG Tabs tablet Commonly known as:  XARELTO   VITAMIN D-1000 MAX ST 25 MCG (1000 UT) tablet Generic drug:  Cholecalciferol     TAKE these medications   amLODipine 10 MG tablet Commonly known as:  NORVASC Take 10 mg by mouth daily.   fentaNYL 12 MCG/HR Commonly known as:  DURAGESIC - dosed mcg/hr Place 1 patch (12.5 mcg total) onto the skin every 3 (three) days.   furosemide 40 MG tablet Commonly known as:  LASIX Take 40 mg by mouth daily.   mesalamine 1.2 g EC tablet Commonly known as:  LIALDA Take 2.4 g by mouth daily with breakfast.   metoprolol succinate 50 MG 24 hr tablet Commonly known as:  TOPROL-XL Take 3 tablets (150 mg  total) by mouth daily.   morphine CONCENTRATE 10 MG/0.5ML Soln concentrated solution Take 1 mL (20 mg total) by mouth every 2 (two) hours as needed for severe pain.   ondansetron 4 MG disintegrating tablet Commonly known as:  ZOFRAN-ODT Take 1 tablet (4 mg total) by mouth every 6 (six) hours as needed for nausea.            Discharge Care Instructions  (From admission, onward)         Start     Ordered   07/16/18 0000  Change dressing    Comments:  Wet to dry dressing to open abdominal wound BID.  Take out current dressing, use wet 4 x 4 to gently clean the sides and the base of the wound, removing loose exudate.  The repack with open 4 x 4's wet with sterile saline. Face cloth wet, Keep packing inside the wound, not on the good skin.  Dry dressing, use paper tape   07/16/18 0757         Follow-up Information    Rolm Bookbinder, MD Follow up in 2 week(s).   Specialty:  General Surgery Why:  Call  for an appointment in 2 week. Contact information: [address] [place] [address] [phone]          Allergies  Allergen Reactions  . Isovue [Iopamidol] Hives and Rash    Pt  Had hives, rash and erythema her chest, face and neck.  Slight eye swelling.  Pt given 50 mg po benadryl.  Pt also developed severe abd pain during contrast injection and continued to get worse.  For those reasons the radiologist had the pt transported by EMS [facility].  Pt needs full premeds in the future.  Felicia Carlson  RTRCT  . Tetanus Immune Globulin Swelling    Consultations:  General surgery  Hospice and palliative care   Procedures/Studies: Ct Abdomen Pelvis Wo Contrast  Result Date: 07/10/2018 CLINICAL DATA:  Perforated sigmoid colon with fecal contamination. Status post LEFT colectomy in transverse colostomy, take down splenic flexure on 07/03/2018. "Midline abdominal wound is breaking down more this morning. She also has a liquid brownish stool appearing fluid coming from  the vaginal area. She has flatus in her ostomy bag but no stool." EXAM: CT ABDOMEN AND PELVIS WITHOUT CONTRAST TECHNIQUE: Multidetector CT imaging of the abdomen and pelvis was performed following the standard protocol without IV contrast. COMPARISON:  None. FINDINGS: Lower chest: There are increased bilateral pleural effusions and bibasilar atelectasis. Central line is partially imaged in the LOWER superior vena cava. There is dense atherosclerotic calcification of the coronary arteries. 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 There is atherosclerotic calcification of the thoracic aorta. Hepatobiliary: Layering debris/stones within the gallbladder. Gallbladder is distended. Pancreas: Unremarkable. No pancreatic ductal dilatation or surrounding inflammatory changes. Spleen: Normal in size without focal abnormality. Adrenals/Urinary Tract: Punctate calcification identified in the midpole region of the RIGHT kidney. There is no hydronephrosis. The ureters are unremarkable. Urinary bladder is decompressed by a Foley catheter. No inflammatory changes surrounding the urinary bladder. Stomach/Bowel: Stomach and small bowel loops are normal in appearance. Status post transverse colostomy and resection of the distal colon. The rectal couch has a small amount of fluid within it. Small amount of stranding is identified between the rectal pouch in the LEFT vaginal cuff. See below. Surgical drain is identified within the pelvis. No loculated fluid collections are present. Vascular/Lymphatic: There is dense atherosclerotic calcification of the abdominal aorta. Reproductive: Hysterectomy. No adnexal mass. There is a possible communication of the LEFT vaginal cuff with the rectal couch, best seen on axial image 76/2 and sagittal image 55/5. No air identified within the vaginal cough. Other: Diffuse body wall edema. Large anterior abdominal wall surgical wound, not associated with focal fluid collection or obvious  enterocutaneous fistula. No abscess. No free intraperitoneal air. Musculoskeletal: Degenerative changes throughout the thoracolumbar spine. IMPRESSION: 1. Interval resection of the distal colon and creation of rectal pouch. 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 2. No pelvic abscess or free intraperitoneal air. 3. Cholelithiasis. 4. Unremarkable appearance of the transverse colostomy. No evidence for bowel obstruction. 5. Increased bilateral effusions, bibasilar atelectasis, and body wall edema. 6. Aneurysmal aortic root. Recommend annual imaging followup by CTA or MRA. This recommendation follows 2010 ACCF/AHA/AATS/ACR/ASA/SCA/SCAI/SIR/STS/SVM Guidelines for the Diagnosis and Management of Patients with Thoracic Aortic Disease. Circulation. 2010; 121: [id number] 7. Hysterectomy. Electronically Signed   By: Nolon Nations M.D.   On: 07/10/2018 15:10   Ct Abdomen Pelvis Wo Contrast  Result Date: 07/03/2018 CLINICAL DATA:  Acute onset of worsening generalized abdominal pain. Bright red  blood in stool. Status post recent colonoscopy. EXAM: CT ABDOMEN AND PELVIS WITHOUT CONTRAST TECHNIQUE: Multidetector CT imaging of the abdomen and pelvis was performed following the standard protocol without IV contrast. COMPARISON:  CT of the abdomen and pelvis from 06/25/2018 FINDINGS: Lower chest: There is a slightly unusual appearance to pleural fluid adjacent to the descending thoracic aorta, but this is thought to reflect a trace pleural effusion and associated atelectasis. Trace bilateral pleural effusions are noted. Scattered coronary artery calcifications are seen. Hepatobiliary: A nonspecific 1.1 cm hypodensity is noted at the hepatic dome. The liver is otherwise unremarkable. The gallbladder is unremarkable in appearance. The common bile duct remains normal in caliber. A large amount of free air is noted tracking about the liver. Pancreas: The pancreas is within normal limits.  Spleen: The spleen is diminutive, with scattered calcified granulomata. Adrenals/Urinary Tract: The adrenal glands are unremarkable in appearance. A small parenchymal calcification is noted at the right kidney. The kidneys are otherwise unremarkable. There is no evidence of hydronephrosis. No renal or ureteral stones are identified. No perinephric stranding is seen. Stomach/Bowel: The stomach is unremarkable in appearance. The small bowel is within normal limits. The appendix is not visualized; there is no evidence for appendicitis. A large amount of free air is noted filling the abdomen, concerning for bowel perforation. A potential site of bowel perforation is at the proximal descending colon, given the pattern of air in this region. The colon is otherwise largely filled with stool. Persistent wall thickening along the mid sigmoid colon is concerning for colitis. Underlying diverticulosis is noted along the sigmoid colon. Vascular/Lymphatic: Scattered calcification is seen along the abdominal aorta and its branches. The abdominal aorta is otherwise grossly unremarkable. The inferior vena cava is grossly unremarkable. No retroperitoneal lymphadenopathy is seen. No pelvic sidewall lymphadenopathy is identified. Reproductive: The bladder is mildly distended and grossly unremarkable. The patient is status post hysterectomy. No suspicious adnexal masses are seen. Other: Soft tissue edema is noted along the lateral abdominal wall and about the pelvis. Musculoskeletal: No acute osseous abnormalities are identified. The visualized musculature is unremarkable in appearance. IMPRESSION: 1. Large amount of free air noted filling the abdomen, concerning for bowel perforation. Question site of bowel perforation at the proximal descending colon, given the pattern of air in this region. 2. Persistent wall thickening along the mid sigmoid colon is concerning for colitis. Underlying diverticulosis along the sigmoid colon. 3.  Somewhat unusual appearance to pleural fluid adjacent to the descending thoracic aorta, but this is thought to reflect a trace pleural effusion and associated atelectasis. Trace bilateral pleural effusions noted. 4. Scattered coronary artery calcifications seen. 5. Nonspecific 1.1 cm hypodensity at the hepatic dome; this may reflect a small cyst. 6. Soft tissue edema along the lateral abdominal wall and about the pelvis. Aortic Atherosclerosis (ICD10-I70.0). Critical Value/emergent results were called by telephone at the time of interpretation on 07/02/2018 at 11:32 pm to the Nursing team at [facility], who verbally acknowledged these results. Electronically Signed   By: Garald Balding M.D.   On: 07/03/2018 00:12   Ct Abdomen Pelvis Wo Contrast  Result Date: 06/25/2018 CLINICAL DATA:  Intermittent rectal bleeding for 2 weeks. EXAM: CT ABDOMEN AND PELVIS WITHOUT CONTRAST TECHNIQUE: Multidetector CT imaging of the abdomen and pelvis was performed following the standard protocol without IV contrast. COMPARISON:  CT abdomen and pelvis 06/13/2018. FINDINGS: Lower chest: Mild dependent atelectasis is seen in the lung bases. No pleural or pericardial effusion. Calcific aortic and coronary  atherosclerosis noted. Hepatobiliary: A few tiny stones are seen layering dependently in the fundus of the gallbladder. No evidence of cholecystitis. Gallbladder and biliary tree appear normal. Pancreas: No pancreatic ductal dilatation or surrounding inflammatory changes. The pancreas is atrophic as seen on the prior exam. Spleen: Normal in size. Calcifications in the spleen consistent with old granulomatous disease noted. Adrenals/Urinary Tract: The adrenal glands are unremarkable. Mild atrophy of the right kidney is seen. A 0.3 cm in diameter nonobstructing stone in the upper pole of the right kidney is noted. No hydronephrosis or ureteral stones. Urinary bladder appears normal. Stomach/Bowel: The rectosigmoid colon is  decompressed but its walls appear thickened. A few scattered diverticula are identified but no focal inflammatory change about a diverticulum is seen. The descending and sigmoid colon are tortuous. There is a large volume of stool in the ascending and transverse colon. No pneumatosis or portal venous gas. The appendix has been removed. The stomach and small bowel appear normal. Vascular/Lymphatic: Aortic atherosclerosis. No enlarged abdominal or pelvic lymph nodes. Reproductive: Status post hysterectomy. No adnexal masses. Other: No free or focal fluid collection. No free intraperitoneal air. Musculoskeletal: No acute or focal abnormality. Scoliosis and multilevel lumbar degenerative change noted. IMPRESSION: Although the rectosigmoid colon is decompressed, its walls appear thickened most consistent with infectious or inflammatory colitis. No CT signs of ischemia are present. Diverticulosis without diverticulitis. Large stool burden ascending and transverse colon. A few tiny gallstones are seen but there is no evidence of cholecystitis. 0.3 cm nonobstructing stone right kidney. Calcific aortic and coronary atherosclerosis. Electronically Signed   By: Inge Rise M.D.   On: 06/25/2018 15:31   Dg Chest Port 1 View  Result Date: 07/04/2018 CLINICAL DATA:  Hypoxia EXAM: PORTABLE CHEST 1 VIEW COMPARISON:  July 03, 2018 FINDINGS: Endotracheal tube tip is 4.3 cm above the carina. Nasogastric tube tip and side port are below the diaphragm. Central catheter tip from right jugular approach is in the superior vena cava near the cavoatrial junction. Left central catheter tip is in the superior vena cava. No pneumothorax. There is airspace consolidation in the left lower lobe with small left pleural effusion. There is a minimal right pleural effusion with right base atelectasis. Heart is upper normal in size with pulmonary vascularity normal. No adenopathy. There is aortic atherosclerosis. No bone lesions.  IMPRESSION: Tube and catheter positions as described without pneumothorax. Airspace consolidation with effusion left lower lobe. Minimal right pleural effusion with right base atelectasis. Stable cardiac silhouette. There is aortic atherosclerosis. Aortic Atherosclerosis (ICD10-I70.0). Electronically Signed   By: Lowella Grip III M.D.   On: 07/04/2018 08:01   Dg Chest Port 1 View  Result Date: 07/03/2018 CLINICAL DATA:  Evaluate central line an ETT EXAM: PORTABLE CHEST 1 VIEW COMPARISON:  June 25, 2018 FINDINGS: A left PICC line terminates in the central SVC. An ET tube is in good position. An NG tube terminates below today's film. A right central line terminates in the central SVC. No pneumothorax. The cardiomediastinal silhouette is stable. Possible small right effusion with underlying atelectasis. No other abnormalities. IMPRESSION: Support apparatus as above. Possible small effusion and atelectasis in the right base. Electronically Signed   By: Dorise Bullion III M.D   On: 07/03/2018 22:28   Dg Chest Port 1 View  Result Date: 06/25/2018 CLINICAL DATA:  Chest pain, shortness of breath. EXAM: PORTABLE CHEST 1 VIEW COMPARISON:  Radiographs of July 25, 2016. FINDINGS: Stable cardiomegaly. No pneumothorax or pleural effusion is noted. Elevated right  hemidiaphragm is noted. No consolidative process is noted. Degenerative changes are seen involving both glenohumeral joints. IMPRESSION: No acute cardiopulmonary abnormality seen. Electronically Signed   By: Marijo Conception, M.D.   On: 06/25/2018 16:10   Dg Abd Portable 1v  Result Date: 07/05/2018 CLINICAL DATA:  NG tube placement EXAM: PORTABLE ABDOMEN - 1 VIEW COMPARISON:  CT abdomen 07/02/2018 FINDINGS: There is a nasogastric tube with the tip coiled in the stomach. There is gaseous distention of small bowel and colon. There is oral contrast material in the distal small bowel and ascending colon. There is no evidence of pneumoperitoneum,  portal venous gas or pneumatosis. There are no pathologic calcifications along the expected course of the ureters. The osseous structures are unremarkable. IMPRESSION: Nasogastric tube coiled in the stomach. Electronically Signed   By: Kathreen Devoid   On: 07/05/2018 02:47   [place] Ekg Site Rite  Result Date: 07/10/2018 If Site Rite image not attached, placement could not be confirmed due to current cardiac rhythm.  [place] Ekg Site Rite  Result Date: 07/03/2018 If Site Rite image not attached, placement could not be confirmed due to current cardiac rhythm.   Subjective: She relates her pain is controlled.  Discharge Exam: Vitals:   07/17/18 0800 07/17/18 0900  BP:    Pulse: 79 79  Resp: 16 15  Temp:    SpO2: 95% 90%   Vitals:   07/17/18 0700 07/17/18 0742 07/17/18 0800 07/17/18 0900  BP:  (!) 120/35    Pulse: 61 (!) 46 79 79  Resp: '18 19 16 15  ' Temp:      TempSrc:      SpO2: 90% (!) 86% 95% 90%  Weight:      Height:        General: Pt is alert, awake, not in acute distress Cardiovascular: RRR, S1/S2 +, no rubs, no gallops Respiratory: CTA bilaterally, no wheezing, no rhonchi Abdominal: Soft, NT, ND, bowel sounds + Extremities: no edema, no cyanosis    The results of significant diagnostics from this hospitalization (including imaging, microbiology, ancillary and laboratory) are listed below for reference.     Microbiology: No results found for this or any previous visit (from the past 240 hour(s)).   Labs: BNP (last 3 results) No results for input(s): BNP in the last 8760 hours. Basic Metabolic Panel: No results for input(s): NA, K, CL, CO2, GLUCOSE, BUN, CREATININE, CALCIUM, MG, PHOS in the last 168 hours. Liver Function Tests: No results for input(s): AST, ALT, ALKPHOS, BILITOT, PROT, ALBUMIN in the last 168 hours. No results for input(s): LIPASE, AMYLASE in the last 168 hours. No results for input(s): AMMONIA in the last 168 hours. CBC: No results for input(s):  WBC, NEUTROABS, HGB, HCT, MCV, PLT in the last 168 hours. Cardiac Enzymes: No results for input(s): CKTOTAL, CKMB, CKMBINDEX, TROPONINI in the last 168 hours. BNP: Invalid input(s): POCBNP CBG: No results for input(s): GLUCAP in the last 168 hours. D-Dimer No results for input(s): DDIMER in the last 72 hours. Hgb A1c No results for input(s): HGBA1C in the last 72 hours. Lipid Profile No results for input(s): CHOL, HDL, LDLCALC, TRIG, CHOLHDL, LDLDIRECT in the last 72 hours. Thyroid function studies No results for input(s): TSH, T4TOTAL, T3FREE, THYROIDAB in the last 72 hours.  Invalid input(s): FREET3 Anemia work up No results for input(s): VITAMINB12, FOLATE, FERRITIN, TIBC, IRON, RETICCTPCT in the last 72 hours. Urinalysis    Component Value Date/Time   COLORURINE STRAW (A) 06/25/2018 1658  APPEARANCEUR CLEAR 06/25/2018 1658   LABSPEC 1.005 06/25/2018 1658   PHURINE 7.0 06/25/2018 1658   GLUCOSEU NEGATIVE 06/25/2018 1658   HGBUR NEGATIVE 06/25/2018 1658   BILIRUBINUR NEGATIVE 06/25/2018 1658   KETONESUR NEGATIVE 06/25/2018 1658   PROTEINUR NEGATIVE 06/25/2018 1658   UROBILINOGEN 0.2 11/02/2010 0300   NITRITE NEGATIVE 06/25/2018 1658   LEUKOCYTESUR NEGATIVE 06/25/2018 1658   Sepsis Labs Invalid input(s): PROCALCITONIN,  WBC,  LACTICIDVEN Microbiology No results found for this or any previous visit (from the past 240 hour(s)).   Time coordinating discharge: 40 minutes  SIGNED:   [name], MD  [facility] 07/23/2018, 2:32 PM Pager   If 7PM-7AM, please contact night-coverage [email or web address] Password [email or web address]

## 2018-07-16 NOTE — Progress Notes (Signed)
[facility] ([facility])    Notified by Marybelle Killings, of family request for [facility] services at home after discharge.  Chart and patient information under review by [facility] physician at this time and eligibility is pending.    Spoke with Mateo Flow (dtr) at the bedside, confirmed interest, answered questions, provided emotional support, and information regarding hospice services and philosophy.      Plan is to discharge later today to daughter Larita Fife' home, via [facility].     Please send signed and completed DNR home with the patient.   Patient will need prescriptions for discharge comfort medications (if necessary).   DME needs discussed, patient currently has the following equipment in the home:    Gilford Rile, wheelchair DME that will be needed: bed, table Equipment specialist with [facility] will order the above.     [facility] is aware of the above information.  Completed discharge summary should be faxed to [phone], once eligibility confirmed, and completed.     Please notify [facility] at [phone] (if after [phone] call [phone]) when the patient is ready to leave the unit.    [facility] information given to Mateo Flow   Above information shared with Suanne Marker, [facility]   Thank you for this referral Venia Carbon BSN, RN [facility] Liaison (listed in [place]) [phone]?

## 2018-07-16 NOTE — Care Management Note (Addendum)
Case Management Note  Patient Details  Name: LAVONA NORSWORTHY MRN: [id number] Date of Birth: 14-Feb-1925  Subjective/Objective:                  Discharged to home with hospice  Action/Plan: [facility] message left with after hours for rn to call back. Spoke with Patient states she is going home with the daughter, does not need equipment Spoke with the daughter [name] Plan patient is going home to her house [place] [id number] will need hospital bed no other equp. tct-Jennifer Woody with hospice above inforamtion given. DC SUMMARY FAXED TO [phone] Expected Discharge Date:  07/16/18               Expected Discharge Plan:  Home/Self Care  In-House Referral:     Discharge planning Services  CM Consult  Post Acute Care Choice:  Resumption of Svcs/PTA Provider Choice offered to:     DME Arranged:    DME Agency:     HH Arranged:  OT, PT [place] Agency:  [place]  Status of Service:  Completed, signed off  If discussed at [place] Meetings, dates discussed:    Additional Comments:  Leeroy Cha, RN 07/16/2018, 8:26 AM

## 2018-07-17 NOTE — Progress Notes (Signed)
14 Days Post-Op   Subjective/Chief Complaint: Pt with no acute changes Comfort care Trx to home hospice   Objective: Vital signs in last 24 hours: Pulse Rate:  [41-114] 61 (11/28 0700) Resp:  [14-28] 18 (11/28 0700) BP: (118-184)/(71-83) 118/83 (11/27 2200) SpO2:  [89 %-98 %] 90 % (11/28 0700) Last BM Date: 07/15/18  Intake/Output from previous day: 11/27 0701 - 11/28 0700 In: 169.5 [P.O.:120; I.V.:49.5] Out: 175 [Urine:150; Stool:25] Intake/Output this shift: No intake/output data recorded.  GI: soft, non-tender; bowel sounds normal; no masses,  no organomegaly and ostomy patent, midline c/d/i  Lab Results:  Recent Labs    07/14/18 2118  HGB 9.4*  HCT 28.2*   BMET Recent Labs    07/15/18 1000  NA 136  K 4.0  CL 105  CO2 25  GLUCOSE 104*  BUN 18  CREATININE 0.41*  CALCIUM 7.8*   PT/INR No results for input(s): LABPROT, INR in the last 72 hours. ABG No results for input(s): PHART, HCO3 in the last 72 hours.  Invalid input(s): PCO2, PO2  Studies/Results: No results found.  Anti-infectives: Anti-infectives (From admission, onward)   Start     Dose/Rate Route Frequency Ordered Stop   07/03/18 2200  piperacillin-tazobactam (ZOSYN) IVPB 3.375 g  Status:  Discontinued     3.375 g 12.5 mL/hr over 240 Minutes Intravenous Every 8 hours 07/03/18 2034 07/15/18 1546   07/03/18 1200  piperacillin-tazobactam (ZOSYN) IVPB 3.375 g  Status:  Discontinued     3.375 g 12.5 mL/hr over 240 Minutes Intravenous Every 8 hours 07/03/18 0444 07/03/18 2012   07/03/18 0445  piperacillin-tazobactam (ZOSYN) IVPB 3.375 g     3.375 g 100 mL/hr over 30 Minutes Intravenous  Once 07/03/18 0440 07/03/18 0640   06/25/18 1600  cefTRIAXone (ROCEPHIN) 2 g in sodium chloride 0.9 % 100 mL IVPB  Status:  Discontinued     2 g 200 mL/hr over 30 Minutes Intravenous Every 24 hours 06/25/18 1546 06/27/18 1348   06/25/18 1600  metroNIDAZOLE (FLAGYL) IVPB 500 mg  Status:  Discontinued     500  mg 100 mL/hr over 60 Minutes Intravenous Every 8 hours 06/25/18 1546 06/27/18 1348      Assessment/Plan: s/p Procedure(s): OPEN [name] PROCEDURE (N/A) Aware of comfort care and home with hospice. Home with wound dressing changes daily Ostomy care Call with any questions   LOS: 22 days    Ralene Ok 07/17/2018

## 2018-07-17 NOTE — Progress Notes (Signed)
Patient seen and examined. Discussed discharge planning with daughter. Patient is ready for discharge today and family ready to accept. Home with hospice.  Cordelia Poche, MD [facility] 07/17/2018, 8:37 AM

## 2018-07-23 DIAGNOSIS — A419 Sepsis, unspecified organism: Secondary | ICD-10-CM

## 2018-08-20 DEATH — deceased

## 2019-12-30 IMAGING — CT CT ABD-PELV W/O CM
2 of 8 series · 14 of 46 positions shown, 16 images · non-contrast
Comparison: CT of the abdomen and pelvis from 06/25/2018

CLINICAL DATA: Acute onset of worsening generalized abdominal pain.
Bright red blood in stool. Status post recent colonoscopy.

EXAM:
CT ABDOMEN AND PELVIS WITHOUT CONTRAST
TECHNIQUE: Multidetector CT imaging of the abdomen and pelvis was performed
following the standard protocol without IV contrast.

[Series 2: axial st · axial · 0.72mm/px · z∈[+914,+1294]mm · 11 of 88 slices shown, 13 images]
[im 6/88  soft-tissue]
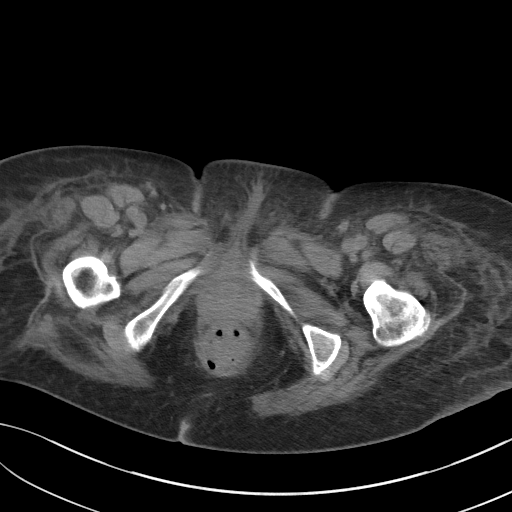
[im 6/88  bone]
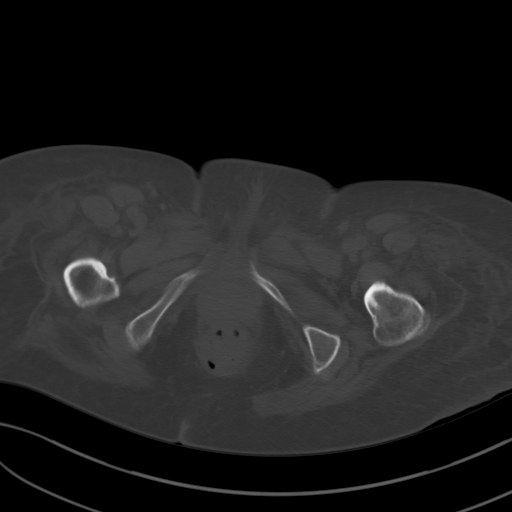
[im 12/88  soft-tissue]
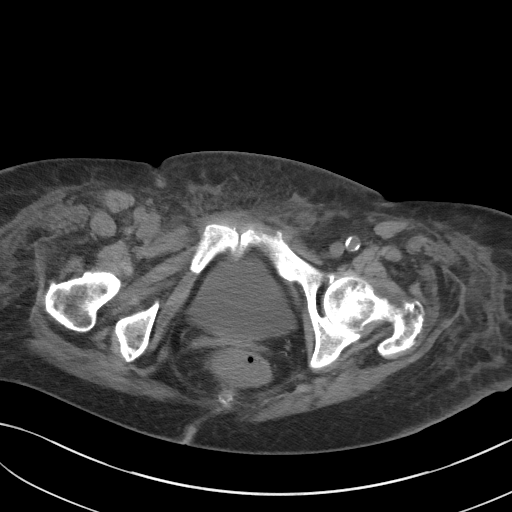
[im 24/88  soft-tissue]
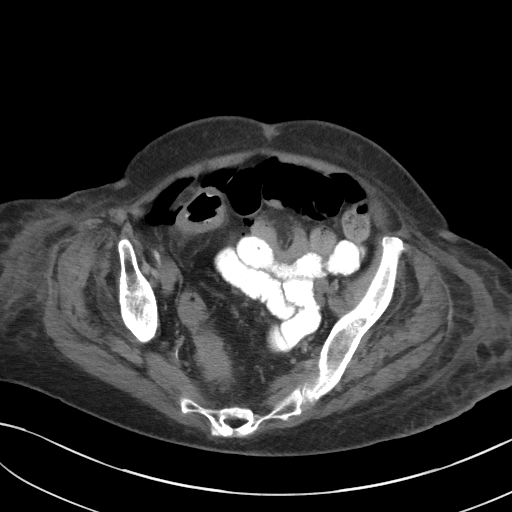
[im 30/88  soft-tissue]
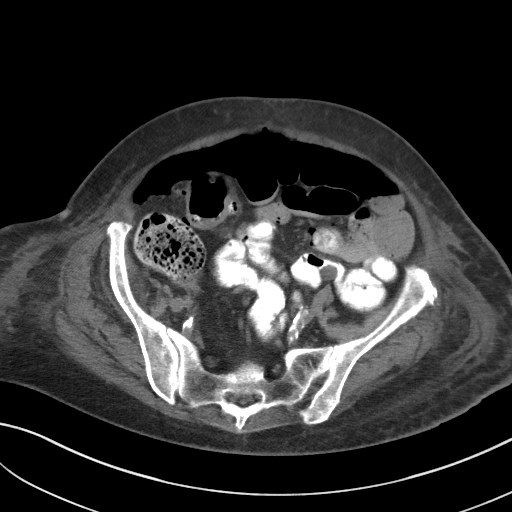
[im 35/88  soft-tissue]
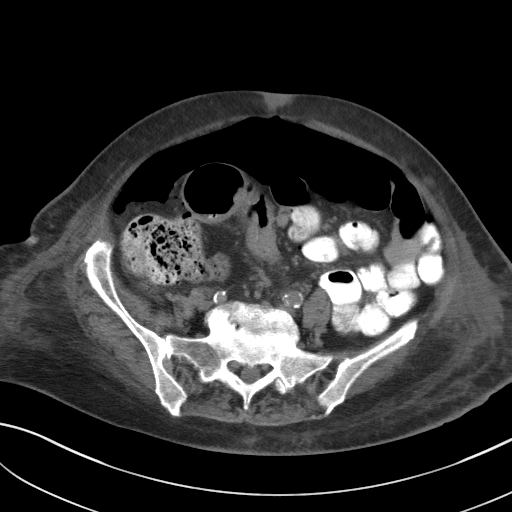
[im 47/88  soft-tissue]
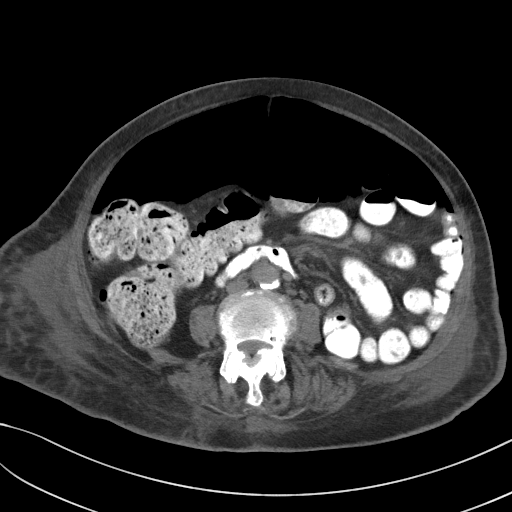
[im 53/88  soft-tissue]
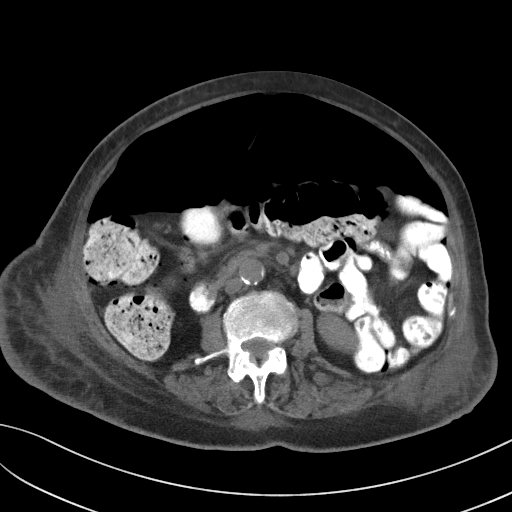
[im 59/88  soft-tissue]
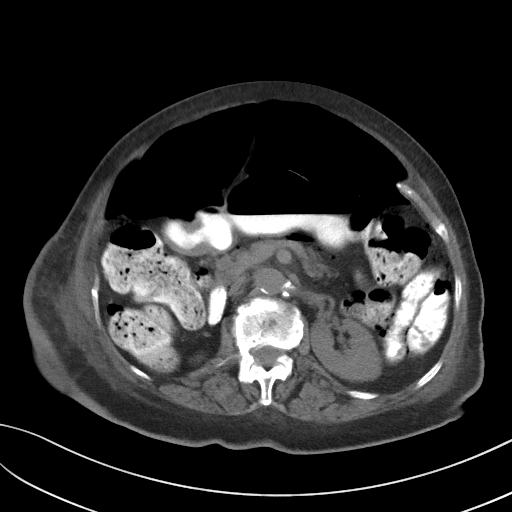
[im 64/88  soft-tissue]
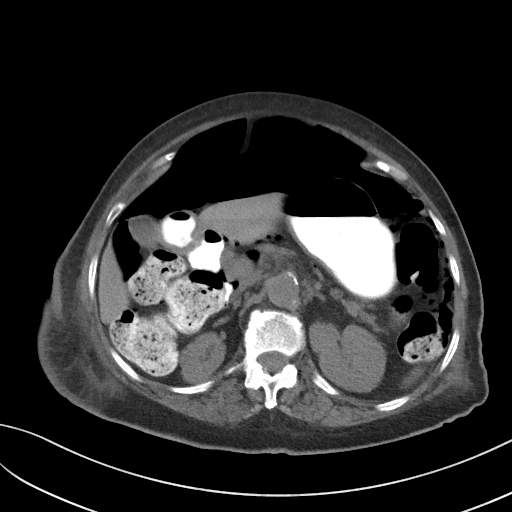
[im 64/88  bone]
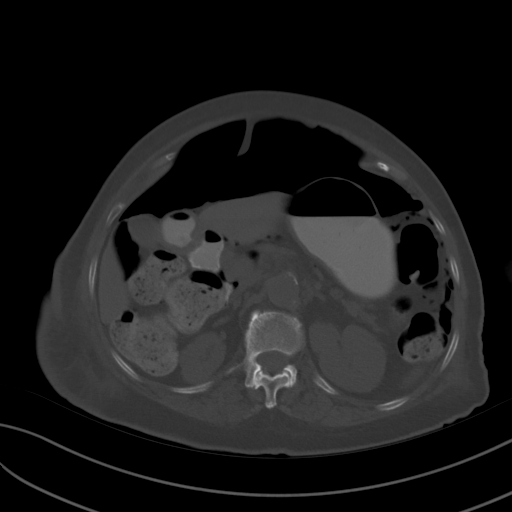
[im 76/88  soft-tissue]
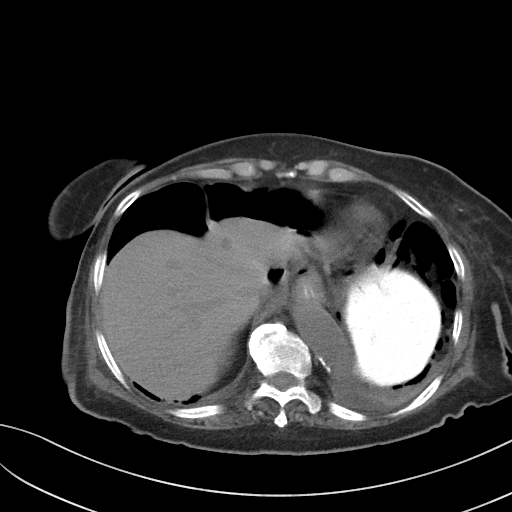
[im 82/88  soft-tissue]
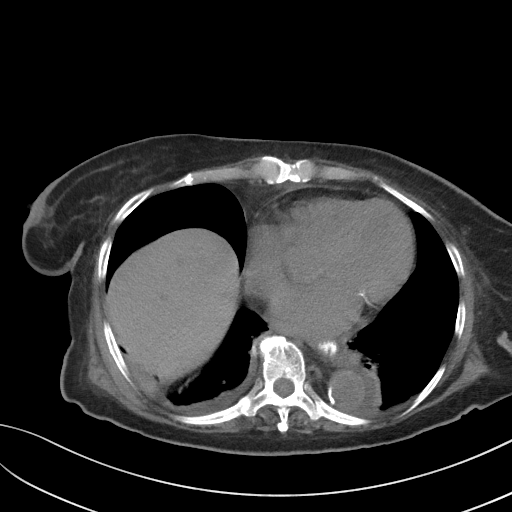

[Series 6: coronal st · coronal · 0.67mm/px · 3 of 91 slices shown]
[im 19/91  soft-tissue]
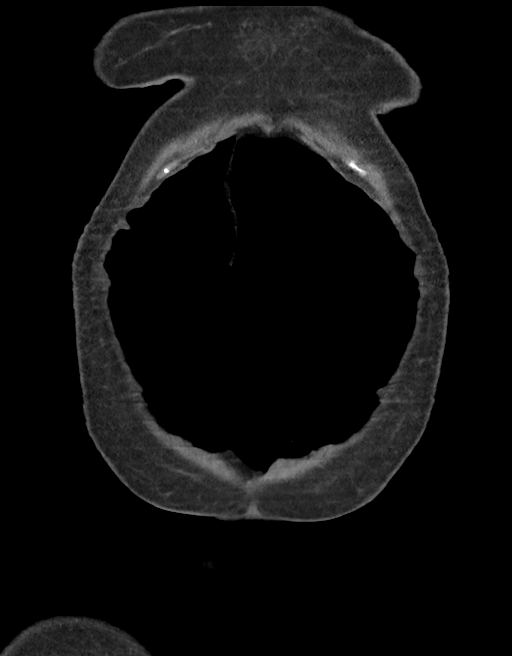
[im 37/91  soft-tissue]
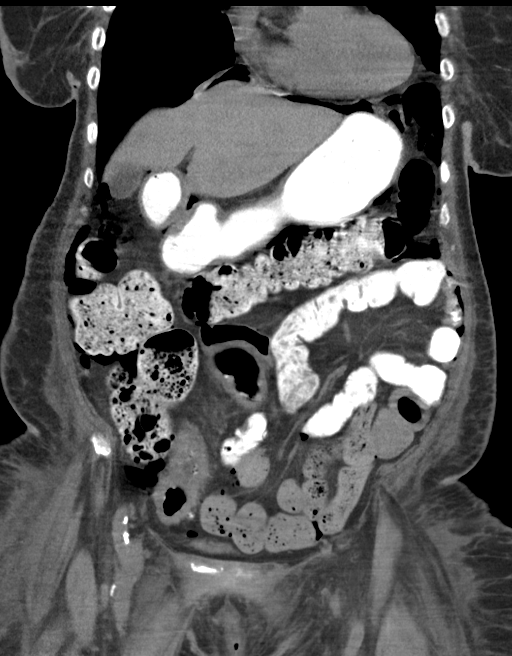
[im 55/91  soft-tissue]
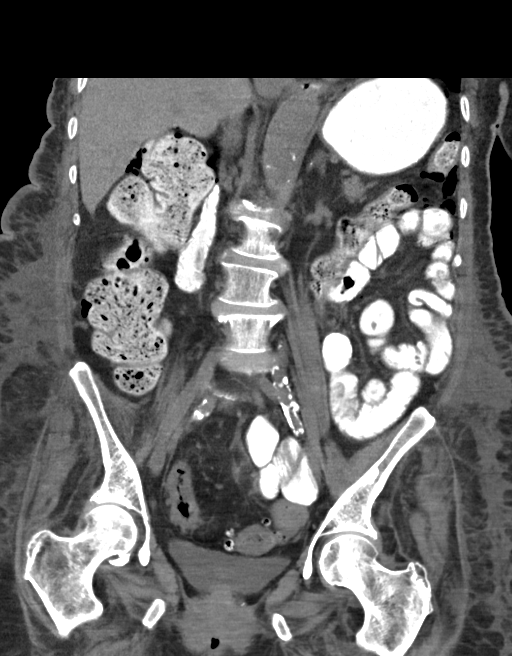

[14 of 46 positions shown; findings below may reference images not displayed]

FINDINGS: Lower chest: There is a slightly unusual appearance to pleural fluid
adjacent to the descending thoracic aorta, but this is thought to
reflect a trace pleural effusion and associated atelectasis. Trace
bilateral pleural effusions are noted. Scattered coronary artery
calcifications are seen.

Hepatobiliary: A nonspecific 1.1 cm hypodensity is noted at the
hepatic dome. The liver is otherwise unremarkable. The gallbladder
is unremarkable in appearance. The common bile duct remains normal
in caliber.

A large amount of free air is noted tracking about the liver.

Pancreas: The pancreas is within normal limits.

Spleen: The spleen is diminutive, with scattered calcified
granulomata.

Adrenals/Urinary Tract: The adrenal glands are unremarkable in
appearance. A small parenchymal calcification is noted at the right
kidney. The kidneys are otherwise unremarkable. There is no evidence
of hydronephrosis. No renal or ureteral stones are identified. No
perinephric stranding is seen.

Stomach/Bowel: The stomach is unremarkable in appearance. The small
bowel is within normal limits. The appendix is not visualized; there
is no evidence for appendicitis.

A large amount of free air is noted filling the abdomen, concerning
for bowel perforation. A potential site of bowel perforation is at
the proximal descending colon, given the pattern of air in this
region.

The colon is otherwise largely filled with stool. Persistent wall
thickening along the mid sigmoid colon is concerning for colitis.
Underlying diverticulosis is noted along the sigmoid colon.

Vascular/Lymphatic: Scattered calcification is seen along the
abdominal aorta and its branches. The abdominal aorta is otherwise
grossly unremarkable. The inferior vena cava is grossly
unremarkable. No retroperitoneal lymphadenopathy is seen. No pelvic
sidewall lymphadenopathy is identified.

Reproductive: The bladder is mildly distended and grossly
unremarkable. The patient is status post hysterectomy. No suspicious
adnexal masses are seen.

Other: Soft tissue edema is noted along the lateral abdominal wall
and about the pelvis.

Musculoskeletal: No acute osseous abnormalities are identified. The
visualized musculature is unremarkable in appearance.
IMPRESSION: 1. Large amount of free air noted filling the abdomen, concerning
for bowel perforation. Question site of bowel perforation at the
proximal descending colon, given the pattern of air in this region.
2. Persistent wall thickening along the mid sigmoid colon is
concerning for colitis. Underlying diverticulosis along the sigmoid
colon.
3. Somewhat unusual appearance to pleural fluid adjacent to the
descending thoracic aorta, but this is thought to reflect a trace
pleural effusion and associated atelectasis. Trace bilateral pleural
effusions noted.
4. Scattered coronary artery calcifications seen.
5. Nonspecific 1.1 cm hypodensity at the hepatic dome; this may
reflect a small cyst.
6. Soft tissue edema along the lateral abdominal wall and about the
pelvis.

Aortic Atherosclerosis (5OMTI-MAS.S).

Critical Value/emergent results were called by telephone at the time
of interpretation on 07/02/2018 at [DATE] to the Nursing team at
[HOSPITAL] 5W, who verbally acknowledged these results.
